# Patient Record
Sex: Female | Born: 1935 | Race: White | Hispanic: No | Smoking: Never smoker
Health system: Southern US, Community
[De-identification: ages and names within clinical notes are randomized; demographics above are authoritative.]

## PROBLEM LIST (undated history)

## (undated) DIAGNOSIS — N3281 Overactive bladder: Secondary | ICD-10-CM

## (undated) DIAGNOSIS — E039 Hypothyroidism, unspecified: Secondary | ICD-10-CM

## (undated) DIAGNOSIS — Z8673 Personal history of transient ischemic attack (TIA), and cerebral infarction without residual deficits: Secondary | ICD-10-CM

## (undated) DIAGNOSIS — R001 Bradycardia, unspecified: Secondary | ICD-10-CM

## (undated) DIAGNOSIS — F32A Depression, unspecified: Secondary | ICD-10-CM

## (undated) DIAGNOSIS — F0390 Unspecified dementia without behavioral disturbance: Secondary | ICD-10-CM

## (undated) DIAGNOSIS — I519 Heart disease, unspecified: Secondary | ICD-10-CM

## (undated) DIAGNOSIS — M1990 Unspecified osteoarthritis, unspecified site: Secondary | ICD-10-CM

## (undated) DIAGNOSIS — F419 Anxiety disorder, unspecified: Secondary | ICD-10-CM

## (undated) DIAGNOSIS — D509 Iron deficiency anemia, unspecified: Secondary | ICD-10-CM

## (undated) DIAGNOSIS — I214 Non-ST elevation (NSTEMI) myocardial infarction: Secondary | ICD-10-CM

## (undated) DIAGNOSIS — I5042 Chronic combined systolic (congestive) and diastolic (congestive) heart failure: Secondary | ICD-10-CM

## (undated) DIAGNOSIS — T4145XA Adverse effect of unspecified anesthetic, initial encounter: Secondary | ICD-10-CM

## (undated) DIAGNOSIS — R410 Disorientation, unspecified: Secondary | ICD-10-CM

## (undated) DIAGNOSIS — F329 Major depressive disorder, single episode, unspecified: Secondary | ICD-10-CM

## (undated) DIAGNOSIS — E7800 Pure hypercholesterolemia, unspecified: Secondary | ICD-10-CM

## (undated) DIAGNOSIS — I10 Essential (primary) hypertension: Secondary | ICD-10-CM

## (undated) DIAGNOSIS — R296 Repeated falls: Secondary | ICD-10-CM

## (undated) DIAGNOSIS — G459 Transient cerebral ischemic attack, unspecified: Secondary | ICD-10-CM

## (undated) DIAGNOSIS — T8859XA Other complications of anesthesia, initial encounter: Secondary | ICD-10-CM

## (undated) HISTORY — DX: Pure hypercholesterolemia, unspecified: E78.00

## (undated) HISTORY — PX: PARTIAL HIP ARTHROPLASTY: SHX733

## (undated) HISTORY — PX: INSERT / REPLACE / REMOVE PACEMAKER: SUR710

## (undated) HISTORY — PX: ABDOMINAL HYSTERECTOMY: SHX81

## (undated) HISTORY — PX: TOTAL KNEE ARTHROPLASTY: SHX125

## (undated) HISTORY — DX: Personal history of transient ischemic attack (TIA), and cerebral infarction without residual deficits: Z86.73

## (undated) HISTORY — DX: Heart disease, unspecified: I51.9

## (undated) HISTORY — DX: Chronic combined systolic (congestive) and diastolic (congestive) heart failure: I50.42

## (undated) HISTORY — DX: Iron deficiency anemia, unspecified: D50.9

---

## 1959-02-16 HISTORY — PX: CATARACT EXTRACTION, BILATERAL: SHX1313

## 1997-09-15 ENCOUNTER — Encounter: Admission: RE | Admit: 1997-09-15 | Discharge: 1997-12-14 | Payer: Self-pay | Admitting: Specialist

## 1998-06-12 ENCOUNTER — Ambulatory Visit (HOSPITAL_COMMUNITY): Admission: RE | Admit: 1998-06-12 | Discharge: 1998-06-12 | Payer: Self-pay

## 1999-06-15 ENCOUNTER — Other Ambulatory Visit: Admission: RE | Admit: 1999-06-15 | Discharge: 1999-06-15 | Payer: Self-pay | Admitting: Internal Medicine

## 1999-08-23 ENCOUNTER — Ambulatory Visit (HOSPITAL_COMMUNITY): Admission: RE | Admit: 1999-08-23 | Discharge: 1999-08-23 | Payer: Self-pay | Admitting: *Deleted

## 2000-02-19 ENCOUNTER — Encounter: Payer: Self-pay | Admitting: Internal Medicine

## 2000-02-19 ENCOUNTER — Encounter: Admission: RE | Admit: 2000-02-19 | Discharge: 2000-02-19 | Payer: Self-pay | Admitting: Internal Medicine

## 2000-10-20 ENCOUNTER — Encounter: Payer: Self-pay | Admitting: Internal Medicine

## 2000-10-20 ENCOUNTER — Encounter: Admission: RE | Admit: 2000-10-20 | Discharge: 2000-10-20 | Payer: Self-pay | Admitting: Internal Medicine

## 2001-03-23 ENCOUNTER — Encounter: Admission: RE | Admit: 2001-03-23 | Discharge: 2001-03-23 | Payer: Self-pay | Admitting: Internal Medicine

## 2001-03-23 ENCOUNTER — Encounter: Payer: Self-pay | Admitting: Internal Medicine

## 2001-12-25 ENCOUNTER — Other Ambulatory Visit: Admission: RE | Admit: 2001-12-25 | Discharge: 2001-12-25 | Payer: Self-pay | Admitting: Family Medicine

## 2002-02-19 ENCOUNTER — Encounter: Payer: Self-pay | Admitting: Specialist

## 2002-02-19 ENCOUNTER — Encounter: Admission: RE | Admit: 2002-02-19 | Discharge: 2002-02-19 | Payer: Self-pay | Admitting: Specialist

## 2002-03-25 ENCOUNTER — Encounter: Payer: Self-pay | Admitting: Family Medicine

## 2002-03-25 ENCOUNTER — Encounter: Admission: RE | Admit: 2002-03-25 | Discharge: 2002-03-25 | Payer: Self-pay | Admitting: Family Medicine

## 2002-06-14 ENCOUNTER — Encounter: Payer: Self-pay | Admitting: Specialist

## 2002-06-18 ENCOUNTER — Inpatient Hospital Stay (HOSPITAL_COMMUNITY): Admission: RE | Admit: 2002-06-18 | Discharge: 2002-06-24 | Payer: Self-pay | Admitting: Specialist

## 2002-06-18 ENCOUNTER — Encounter: Payer: Self-pay | Admitting: Specialist

## 2002-06-22 ENCOUNTER — Encounter: Payer: Self-pay | Admitting: Specialist

## 2003-05-02 ENCOUNTER — Encounter: Admission: RE | Admit: 2003-05-02 | Discharge: 2003-05-02 | Payer: Self-pay | Admitting: Family Medicine

## 2004-05-30 ENCOUNTER — Encounter: Admission: RE | Admit: 2004-05-30 | Discharge: 2004-05-30 | Payer: Self-pay | Admitting: Family Medicine

## 2004-11-19 ENCOUNTER — Encounter: Admission: RE | Admit: 2004-11-19 | Discharge: 2004-11-19 | Payer: Self-pay | Admitting: Family Medicine

## 2005-06-13 ENCOUNTER — Encounter: Admission: RE | Admit: 2005-06-13 | Discharge: 2005-06-13 | Payer: Self-pay | Admitting: Family Medicine

## 2005-06-26 ENCOUNTER — Emergency Department (HOSPITAL_COMMUNITY): Admission: EM | Admit: 2005-06-26 | Discharge: 2005-06-26 | Payer: Self-pay | Admitting: Emergency Medicine

## 2005-06-28 ENCOUNTER — Emergency Department (HOSPITAL_COMMUNITY): Admission: EM | Admit: 2005-06-28 | Discharge: 2005-06-29 | Payer: Self-pay | Admitting: Emergency Medicine

## 2005-07-02 ENCOUNTER — Inpatient Hospital Stay (HOSPITAL_COMMUNITY): Admission: EM | Admit: 2005-07-02 | Discharge: 2005-07-04 | Payer: Self-pay | Admitting: Emergency Medicine

## 2005-07-02 ENCOUNTER — Ambulatory Visit: Payer: Self-pay | Admitting: Cardiovascular Disease

## 2005-07-03 ENCOUNTER — Encounter: Payer: Self-pay | Admitting: Cardiovascular Disease

## 2006-06-25 ENCOUNTER — Encounter: Admission: RE | Admit: 2006-06-25 | Discharge: 2006-06-25 | Payer: Self-pay | Admitting: Family Medicine

## 2006-07-17 ENCOUNTER — Ambulatory Visit: Payer: Self-pay | Admitting: Cardiology

## 2006-07-17 ENCOUNTER — Inpatient Hospital Stay (HOSPITAL_COMMUNITY): Admission: EM | Admit: 2006-07-17 | Discharge: 2006-07-20 | Payer: Self-pay | Admitting: Emergency Medicine

## 2006-07-18 ENCOUNTER — Encounter: Payer: Self-pay | Admitting: Cardiology

## 2006-07-29 ENCOUNTER — Inpatient Hospital Stay (HOSPITAL_COMMUNITY): Admission: AD | Admit: 2006-07-29 | Discharge: 2006-07-31 | Payer: Self-pay | Admitting: Neurology

## 2006-07-29 ENCOUNTER — Encounter: Payer: Self-pay | Admitting: Internal Medicine

## 2006-07-30 ENCOUNTER — Encounter (INDEPENDENT_AMBULATORY_CARE_PROVIDER_SITE_OTHER): Payer: Self-pay | Admitting: Neurology

## 2006-08-15 ENCOUNTER — Ambulatory Visit: Payer: Self-pay | Admitting: Cardiology

## 2007-01-03 ENCOUNTER — Ambulatory Visit: Payer: Self-pay | Admitting: *Deleted

## 2007-01-03 ENCOUNTER — Inpatient Hospital Stay (HOSPITAL_COMMUNITY): Admission: EM | Admit: 2007-01-03 | Discharge: 2007-01-06 | Payer: Self-pay | Admitting: Emergency Medicine

## 2007-01-05 HISTORY — PX: PACEMAKER INSERTION: SHX728

## 2007-01-21 ENCOUNTER — Ambulatory Visit: Payer: Self-pay

## 2007-05-05 ENCOUNTER — Ambulatory Visit: Payer: Self-pay | Admitting: Internal Medicine

## 2007-07-17 ENCOUNTER — Encounter: Admission: RE | Admit: 2007-07-17 | Discharge: 2007-07-17 | Payer: Self-pay | Admitting: Family Medicine

## 2008-01-05 ENCOUNTER — Ambulatory Visit: Payer: Self-pay | Admitting: Internal Medicine

## 2008-05-20 ENCOUNTER — Encounter
Admission: RE | Admit: 2008-05-20 | Discharge: 2008-08-15 | Payer: Self-pay | Admitting: Physical Medicine and Rehabilitation

## 2008-05-23 ENCOUNTER — Ambulatory Visit (HOSPITAL_COMMUNITY)
Admission: RE | Admit: 2008-05-23 | Discharge: 2008-05-23 | Payer: Self-pay | Admitting: Physical Medicine and Rehabilitation

## 2008-05-23 ENCOUNTER — Ambulatory Visit: Payer: Self-pay | Admitting: Physical Medicine and Rehabilitation

## 2008-06-20 ENCOUNTER — Ambulatory Visit: Payer: Self-pay

## 2008-06-24 ENCOUNTER — Ambulatory Visit: Payer: Self-pay | Admitting: Physical Medicine and Rehabilitation

## 2008-06-24 ENCOUNTER — Ambulatory Visit (HOSPITAL_COMMUNITY)
Admission: RE | Admit: 2008-06-24 | Discharge: 2008-06-24 | Payer: Self-pay | Admitting: Physical Medicine and Rehabilitation

## 2008-08-15 ENCOUNTER — Encounter: Admission: RE | Admit: 2008-08-15 | Discharge: 2008-08-15 | Payer: Self-pay | Admitting: Family Medicine

## 2008-08-15 ENCOUNTER — Ambulatory Visit: Payer: Self-pay | Admitting: Physical Medicine and Rehabilitation

## 2008-09-20 ENCOUNTER — Encounter
Admission: RE | Admit: 2008-09-20 | Discharge: 2008-09-20 | Payer: Self-pay | Admitting: Physical Medicine and Rehabilitation

## 2008-09-23 ENCOUNTER — Ambulatory Visit: Payer: Self-pay | Admitting: Physical Medicine and Rehabilitation

## 2008-09-26 ENCOUNTER — Encounter (INDEPENDENT_AMBULATORY_CARE_PROVIDER_SITE_OTHER): Payer: Self-pay

## 2008-11-30 ENCOUNTER — Encounter
Admission: RE | Admit: 2008-11-30 | Discharge: 2008-12-02 | Payer: Self-pay | Admitting: Physical Medicine and Rehabilitation

## 2008-12-02 ENCOUNTER — Ambulatory Visit: Payer: Self-pay | Admitting: Physical Medicine and Rehabilitation

## 2009-02-24 DIAGNOSIS — E7800 Pure hypercholesterolemia, unspecified: Secondary | ICD-10-CM

## 2009-02-24 DIAGNOSIS — R42 Dizziness and giddiness: Secondary | ICD-10-CM

## 2009-02-24 DIAGNOSIS — E039 Hypothyroidism, unspecified: Secondary | ICD-10-CM

## 2009-02-24 DIAGNOSIS — D509 Iron deficiency anemia, unspecified: Secondary | ICD-10-CM | POA: Insufficient documentation

## 2009-02-24 DIAGNOSIS — I498 Other specified cardiac arrhythmias: Secondary | ICD-10-CM

## 2009-02-24 DIAGNOSIS — M1990 Unspecified osteoarthritis, unspecified site: Secondary | ICD-10-CM | POA: Insufficient documentation

## 2009-02-24 DIAGNOSIS — I10 Essential (primary) hypertension: Secondary | ICD-10-CM | POA: Insufficient documentation

## 2009-02-24 DIAGNOSIS — Z950 Presence of cardiac pacemaker: Secondary | ICD-10-CM

## 2009-02-28 ENCOUNTER — Ambulatory Visit: Payer: Self-pay | Admitting: Internal Medicine

## 2009-06-21 ENCOUNTER — Encounter: Payer: Self-pay | Admitting: Internal Medicine

## 2009-06-21 ENCOUNTER — Ambulatory Visit: Payer: Self-pay

## 2009-12-05 ENCOUNTER — Encounter: Payer: Self-pay | Admitting: Internal Medicine

## 2009-12-06 ENCOUNTER — Telehealth: Payer: Self-pay | Admitting: Internal Medicine

## 2009-12-19 ENCOUNTER — Ambulatory Visit: Payer: Self-pay | Admitting: Internal Medicine

## 2010-06-14 ENCOUNTER — Encounter: Payer: Self-pay | Admitting: Internal Medicine

## 2010-06-14 ENCOUNTER — Ambulatory Visit: Payer: Self-pay

## 2010-07-19 NOTE — Procedures (Signed)
Summary: pcp   Current Medications (verified): 1)  Aggrenox 25-200 Mg Xr12h-Cap (Aspirin-Dipyridamole) .... Take One Capsule By Mouth Twice A Day 2)  Synthroid 75 Mcg Tabs (Levothyroxine Sodium) .Marland Kitchen.. 1 By Mouth Once Daily 3)  Diclofenac Sodium 75 Mg Tbec (Diclofenac Sodium) .Marland Kitchen.. 1 By Mouth Two Times A Day 4)  Vitamin B-12 1000 Mcg Tabs (Cyanocobalamin) .Marland Kitchen.. 1 By Mouth Once Daily 5)  Pravastatin Sodium 40 Mg Tabs (Pravastatin Sodium) .... Take One Tablet By Mouth Daily At Bedtime 6)  Oyster Shell Calcium/d 500-125 Mg-Unit Tabs (Calcium-Vitamin D) .Marland Kitchen.. 1 By Mouth Two Times A Day 7)  Fish Oil   Oil (Fish Oil) .Marland Kitchen.. 1 By Mouth Two Times A Day 8)  Multivitamins   Tabs (Multiple Vitamin) .Marland Kitchen.. 1 By Mouth Once Daily 9)  Celexa 20 Mg Tabs (Citalopram Hydrobromide) .Marland Kitchen.. 1 By Mouth Once Daily 10)  Lisinopril 40 Mg Tabs (Lisinopril) .... Take One Tablet By Mouth Daily 11)  Furosemide 20 Mg Tabs (Furosemide) .... Take One Tablet By Mouth Daily. 12)  Potassium Chloride Crys Cr 20 Meq Cr-Tabs (Potassium Chloride Crys Cr) .... Take One Tablet By Mouth Daily 13)  Ropinirole Hcl 1 Mg Tabs (Ropinirole Hcl) .Marland Kitchen.. 1 By Mouth Once Daily 14)  Gabapentin 100 Mg Caps (Gabapentin) .... 2 By Mouth Once Daily 15)  Amlodipine Besylate 10 Mg Tabs (Amlodipine Besylate) .... Take One Tablet By Mouth Daily  Allergies (verified): No Known Drug Allergies  PPM Specifications Following MD:  Lewayne Bunting, MD     PPM Vendor:  Medtronic     PPM Model Number:  ADDR01     PPM Serial Number:  [id number] PPM DOI:  01/05/2007     PPM Implanting MD:  Lewayne Bunting, MD  Lead 1    Location: RA     DOI: 01/05/2007     Model #: [id number]     Serial #: [id number]     Status: active Lead 2    Location: RV     DOI: 01/05/2007     Model #: 6213     Serial #: [id number]     Status: active  Magnet Response Rate:  BOL 85 ERI  65  Indications:  Symptomatic Bradycardia   PPM Follow Up Remote Check?  No Battery Voltage:  2.78 V     Battery Est.  Longevity:  7.5 years     Pacer Dependent:  No       PPM Device Measurements Atrium  Amplitude: 2.0 mV, Impedance: 460 ohms, Threshold: 0.375 V at .04 msec Right Ventricle  Amplitude: 11.20 mV, Impedance: 612 ohms, Threshold: 0.625 V at 0.4 msec  Episodes MS Episodes:  1     Percent Mode Switch:  <0.1%     Coumadin:  No Ventricular High Rate:  0     Atrial Pacing:  67%     Ventricular Pacing:  0.2%  Parameters Mode:  DDDR+     Lower Rate Limit:  60     Upper Rate Limit:  130 Paced AV Delay:  150     Sensed AV Delay:  120 Next Cardiology Appt Due:  12/15/2009 Tech Comments:  No parameter changes.  Device function normal.  ROV 6 months Dr. [name]. Altha Harm, LPN  June 21, 2009 11:21 AM  MD Comments:  Agree with above.

## 2010-07-19 NOTE — Cardiovascular Report (Signed)
Summary: Office Visit   Office Visit   Imported By: Roderic Ovens 07/12/2009 14:08:14  _____________________________________________________________________  External Attachment:    Type:   Image     Comment:   External Document

## 2010-07-19 NOTE — Progress Notes (Signed)
Summary: appt sooner  Phone Note From Other Clinic   Caller: nurse Heather Summary of Call: per [facility] wants to know if pt needs to be seen sooner that 7/5. sending EKG to look at ofc [phone] Initial call taken by: Edman Circle,  December 06, 2009 8:37 AM  Follow-up for Phone Call        no need to see sooner per Dr [name] Dr [facility] aware Dennis Bast, RN, BSN  December 06, 2009 3:15 PM

## 2010-07-19 NOTE — Letter (Signed)
Summary: [facility] Office Visit Note   [facility] Office Visit Note   Imported By: Roderic Ovens 12/27/2009 10:28:47  _____________________________________________________________________  External Attachment:    Type:   Image     Comment:   External Document

## 2010-07-19 NOTE — Assessment & Plan Note (Signed)
Summary: pacer check/medtronic   History of Present Illness: Mr. [name] return today for followup.  She is an 77 yo woman with a h/o symptomatic bradycardia and HTN who is s/p PPM.  She denies c/p or sob.  No syncope.  Her main problem is arthritis.  She has had several joint replacements.  No peripheral edema. The patient c/o ongoing right shoulder pain.    Current Medications (verified): 1)  Aggrenox 25-200 Mg Xr12h-Cap (Aspirin-Dipyridamole) .... Take One Capsule By Mouth Twice A Day 2)  Synthroid 75 Mcg Tabs (Levothyroxine Sodium) .Marland Kitchen.. 1 By Mouth Once Daily 3)  Diclofenac Sodium 75 Mg Tbec (Diclofenac Sodium) .Marland Kitchen.. 1 By Mouth Two Times A Day 4)  Vitamin B-12 1000 Mcg Tabs (Cyanocobalamin) .Marland Kitchen.. 1 By Mouth Once Daily 5)  Pravastatin Sodium 40 Mg Tabs (Pravastatin Sodium) .... Take One Tablet By Mouth Daily At Bedtime 6)  Oyster Shell Calcium/d 500-125 Mg-Unit Tabs (Calcium-Vitamin D) .Marland Kitchen.. 1 By Mouth Two Times A Day 7)  Fish Oil   Oil (Fish Oil) .Marland Kitchen.. 1 By Mouth Two Times A Day 8)  Multivitamins   Tabs (Multiple Vitamin) .Marland Kitchen.. 1 By Mouth Once Daily 9)  Celexa 20 Mg Tabs (Citalopram Hydrobromide) .Marland Kitchen.. 1 By Mouth Once Daily 10)  Lisinopril 40 Mg Tabs (Lisinopril) .... Take One Tablet By Mouth Daily 11)  Furosemide 20 Mg Tabs (Furosemide) .... Take One Tablet By Mouth Daily. 12)  Potassium Chloride Crys Cr 20 Meq Cr-Tabs (Potassium Chloride Crys Cr) .... Take One Tablet By Mouth Daily 13)  Ropinirole Hcl 1 Mg Tabs (Ropinirole Hcl) .Marland Kitchen.. 1 By Mouth Once Daily 14)  Gabapentin 100 Mg Caps (Gabapentin) .... 2 By Mouth Once Daily 15)  Amlodipine Besylate 10 Mg Tabs (Amlodipine Besylate) .... Take One Tablet By Mouth Daily  Allergies (verified): No Known Drug Allergies  Past History:  Past Medical History: Last updated: 02/24/2009 PACEMAKER, PERMANENT (ICD-V45.01) DIZZINESS (ICD-780.4) BRADYCARDIA (ICD-427.89) ANEMIA, IRON DEFICIENCY (ICD-280.9) HYPOTHYROIDISM (ICD-244.9) HYPERTENSION  (ICD-401.9) HYPERCHOLESTEROLEMIA (ICD-272.0) CHF (ICD-428.0) OSTEOARTHRITIS (ICD-715.90)  Past Surgical History: Last updated: 02/24/2009 implantation of a Medtronic dual- chamber pacemaker  01/05/2007  [name]. Dana Ridgel, MD   Bilateral total hip arthroplasties.      Review of Systems  The patient denies chest pain, syncope, dyspnea on exertion, and peripheral edema.    Vital Signs:  Patient profile:   77 year old female Height:      64 inches Weight:      143 pounds BMI:     24.63 Pulse rate:   56 / minute BP sitting:   90 / 46  (left arm)  Vitals Entered By: Laurance Flatten CMA (December 19, 2009 11:36 AM)  Physical Exam  General:  Well developed, well nourished, in no acute distress. Head:  normocephalic and atraumatic Eyes:  PERRLA/EOM intact; conjunctiva and lids normal. Mouth:  Teeth, gums and palate normal. Oral mucosa normal. Neck:  Neck supple, no JVD. No masses, thyromegaly or abnormal cervical nodes. Chest Wall:  well healed PPM incision. Lungs:  Clear bilaterally to auscultation with no wheezes, rales, or rhonchi. Heart:  RRR with normal S1 and S2.  PMI is not enlarged or laterally displaced.  No murmurs. Abdomen:  Bowel sounds positive; abdomen soft and non-tender without masses, organomegaly, or hernias noted. No hepatosplenomegaly. Msk:  Back normal, normal gait. Muscle strength and tone normal. Pulses:  pulses normal in all 4 extremities Extremities:  No clubbing or cyanosis. Neurologic:  Alert and oriented x 3.   PPM Specifications  Following MD:  Lewayne Bunting, MD     PPM Vendor:  Medtronic     PPM Model Number:  ADDR01     PPM Serial Number:  [id number] PPM DOI:  01/05/2007     PPM Implanting MD:  Lewayne Bunting, MD  Lead 1    Location: RA     DOI: 01/05/2007     Model #: 6213     Serial #: [id number]     Status: active Lead 2    Location: RV     DOI: 01/05/2007     Model #: 2952     Serial #: [id number]     Status: active  Magnet Response Rate:  BOL 85 ERI   65  Indications:  Symptomatic Bradycardia   PPM Follow Up Remote Check?  No Battery Voltage:  2.78 V     Battery Est. Longevity:  7.5 years     Pacer Dependent:  No       PPM Device Measurements Atrium  Amplitude: 1.4 mV, Impedance: 473 ohms, Threshold: 0.375 V at 0.4 msec Right Ventricle  Amplitude: 15.68 mV, Impedance: 612 ohms, Threshold: 0.5 V at 0.4 msec  Episodes MS Episodes:  0     Percent Mode Switch:  0     Coumadin:  No Ventricular High Rate:  0     Atrial Pacing:  61.6%     Ventricular Pacing:  3.2%  Parameters Mode:  DDDR+     Lower Rate Limit:  60     Upper Rate Limit:  130 Paced AV Delay:  150     Sensed AV Delay:  120 Next Cardiology Appt Due:  06/17/2010 Tech Comments:  No parameter changes.  Device function normal.  ROV 6 months clinic.  Checked by [employer]. Altha Harm, LPN  December 20, 2723 11:57 AM  MD Comments:  Agree with above.  Impression & Recommendations:  Problem # 1:  PACEMAKER, PERMANENT (ICD-V45.01) Her device is working normally.  Will recheck in several months.  Problem # 2:  HYPERTENSION (ICD-401.9) Her pressures have been fairly well controlled.  Continue meds as below and maintain a low sodium diet. Her updated medication list for this problem includes:    Lisinopril 40 Mg Tabs (Lisinopril) .Marland Kitchen... Take one tablet by mouth daily    Furosemide 20 Mg Tabs (Furosemide) .Marland Kitchen... Take one tablet by mouth daily.    Amlodipine Besylate 10 Mg Tabs (Amlodipine besylate) .Marland Kitchen... Take one tablet by mouth daily  Problem # 3:  CHF (ICD-428.0) Her symptoms are mostly class 1-2.  A low sodium diet and medical therapy as below are recommended. Her updated medication list for this problem includes:    Aggrenox 25-200 Mg Xr12h-cap (Aspirin-dipyridamole) .Marland Kitchen... Take one capsule by mouth twice a day    Lisinopril 40 Mg Tabs (Lisinopril) .Marland Kitchen... Take one tablet by mouth daily    Furosemide 20 Mg Tabs (Furosemide) .Marland Kitchen... Take one tablet by mouth daily.    Amlodipine  Besylate 10 Mg Tabs (Amlodipine besylate) .Marland Kitchen... Take one tablet by mouth daily

## 2010-07-19 NOTE — Procedures (Signed)
Summary: device check medtronic/sl   Current Medications (verified): 1)  Aggrenox 25-200 Mg Xr12h-Cap (Aspirin-Dipyridamole) .... Take One Capsule By Mouth Twice A Day 2)  Synthroid 75 Mcg Tabs (Levothyroxine Sodium) .Marland Kitchen.. 1 By Mouth Once Daily 3)  Diclofenac Sodium 75 Mg Tbec (Diclofenac Sodium) .Marland Kitchen.. 1 By Mouth Two Times A Day 4)  Vitamin B-12 1000 Mcg Tabs (Cyanocobalamin) .Marland Kitchen.. 1 By Mouth Once Daily 5)  Pravastatin Sodium 40 Mg Tabs (Pravastatin Sodium) .... Take One Tablet By Mouth Daily At Bedtime 6)  Oyster Shell Calcium/d 500-125 Mg-Unit Tabs (Calcium-Vitamin D) .Marland Kitchen.. 1 By Mouth Two Times A Day 7)  Fish Oil   Oil (Fish Oil) .Marland Kitchen.. 1 By Mouth Two Times A Day 8)  Multivitamins   Tabs (Multiple Vitamin) .Marland Kitchen.. 1 By Mouth Once Daily 9)  Celexa 20 Mg Tabs (Citalopram Hydrobromide) .Marland Kitchen.. 1 By Mouth Once Daily 10)  Lisinopril 40 Mg Tabs (Lisinopril) .... Take One Tablet By Mouth Daily 11)  Furosemide 20 Mg Tabs (Furosemide) .... Take One Tablet By Mouth Daily. 12)  Potassium Chloride Crys Cr 20 Meq Cr-Tabs (Potassium Chloride Crys Cr) .... Take One Tablet By Mouth Daily 13)  Ropinirole Hcl 1 Mg Tabs (Ropinirole Hcl) .Marland Kitchen.. 1 By Mouth Once Daily 14)  Gabapentin 100 Mg Caps (Gabapentin) .... 2 By Mouth Once Daily 15)  Amlodipine Besylate 10 Mg Tabs (Amlodipine Besylate) .... Take One Tablet By Mouth Daily 16)  Hydrocodone-Acetaminophen 5-500 Mg Tabs (Hydrocodone-Acetaminophen) .... Take 1/2 To 1 Tablet By Mouth Four Times A Day As Needed  Allergies (verified): No Known Drug Allergies  PPM Specifications Following MD:  Lewayne Bunting, MD     PPM Vendor:  Medtronic     PPM Model Number:  ADDR01     PPM Serial Number:  [id number] PPM DOI:  01/05/2007     PPM Implanting MD:  Lewayne Bunting, MD  Lead 1    Location: RA     DOI: 01/05/2007     Model #: 7829     Serial #: [id number]     Status: active Lead 2    Location: RV     DOI: 01/05/2007     Model #: 8469     Serial #: [id number]     Status:  active  Magnet Response Rate:  BOL 85 ERI  65  Indications:  Symptomatic Bradycardia   PPM Follow Up Battery Voltage:  2.78 V     Battery Est. Longevity:  6.5 yrs     Pacer Dependent:  No       PPM Device Measurements Atrium  Amplitude: 2.80 mV, Impedance: 501 ohms, Threshold: 0.50 V at 0.40 msec Right Ventricle  Amplitude: 22.40 mV, Impedance: 539 ohms, Threshold: 0.50 V at 0.40 msec  Episodes MS Episodes:  1     Percent Mode Switch:  <0.1%     Coumadin:  No Ventricular High Rate:  0     Atrial Pacing:  72.2%     Ventricular Pacing:  10.3%  Parameters Mode:  MVP     Lower Rate Limit:  60     Upper Rate Limit:  130 Paced AV Delay:  150     Sensed AV Delay:  120 Next Cardiology Appt Due:  01/12/2011 Tech Comments:  1 MODE SWITCH LASTING LESS THAN 1 MINUTE.  NORMAL DEVICE FUNCTION.  CHANGED RA OUTPUT FROM 1.5 TO 2.0 AND RV OUTPUT FROM 2.0 TO 2.5 V.  ROV IN 6 MTHS W/GT. Vella Kohler  June 14, 2010 1:23  PM

## 2010-07-19 NOTE — Cardiovascular Report (Signed)
Summary: Office Visit   Office Visit   Imported By: Roderic Ovens 06/20/2010 12:22:59  _____________________________________________________________________  External Attachment:    Type:   Image     Comment:   External Document

## 2010-10-30 NOTE — Assessment & Plan Note (Signed)
[facility]                         ELECTROPHYSIOLOGY OFFICE NOTE   [name], [name]                    MRN:          [id number]  DATE:05/05/2007                            DOB:          June 16, 1919    [name] returns today for followup of her pacemaker.  She is a very  pleasant elderly woman with symptomatic bradycardia who underwent  permanent pacemaker insertion back in July.  She returns today for  followup.  She is improved, though she does continue to have some  weakness.  She states that when she gets tired, she has to stop and rest  now.  She denies chest pain. She has dyspnea on exertion.   PHYSICAL EXAMINATION:  GENERAL:  She is a pleasant elderly woman in no  distress.  VITAL SIGNS:  Blood pressure today was 138/76.  The pulse was 88 and  regular.  Respirations were 18.  Weight was 152 pounds.  NECK:  No jugular venous distention.  LUNGS:  Clear bilaterally to auscultation.  No wheezes, rales, or  rhonchi were present.  CARDIOVASCULAR:  Regular rate and rhythm with normal S1 and S2.  EXTREMITIES:  Demonstrated no edema.   Interrogation of her pacemaker demonstrates a Medtronic Adapta with P  waves greater than 2 and R waves of 11, impedance of 533 in the atrium,  623 in the ventricle.  Threshold was 0.5 at 0.4 in both the atrium and  the ventricle.  The battery voltage was 2.78 volts .  She was 68% A  paced.   IMPRESSION:  1. Symptomatic bradycardia.  2. Hypertension.  3. Status post pacemaker insertion.   DISCUSSION:  Overall, [name] is stable, and her pacemaker is working  normally.  We will plan to see her back for followup in July 2009.     [name]. [name], MD  Electronically Signed    GWT/MedQ  DD: 05/05/2007  DT: 05/05/2007  Job #: [id number]   cc:   Burnell Blanks, MD

## 2010-10-30 NOTE — Assessment & Plan Note (Signed)
[name] is an 77 year old married woman who is accompanied by her  granddaughter today.  Her name is [name].  She is the patient of Dr. Phylliss Bob  as well as Dr. Burnell Blanks.   [name] was last seen by me on June 24, 2008.  In the interim, she  has had some physical therapy to work on lower extremity strengthening  and balance.  She reports overall improvement in her strength.  She was  also trialed on gabapentin 100 mg twice a day.  She reports that she  believes it helped somewhat as well with her pain into the shoulders and  neck, especially helpful in the evening when she takes before bed.   Average pain is about a 7 on a scale of 10.  Again, localized mainly to  the shoulders.  Occasional numbness down the right arm.   Sleep is fair.  Pain is worse with activity, improves with medication.   She reports fair relief with current meds.   FUNCTIONAL STATUS:  The patient states in the interim, she has been  doing some baking.  She had been able to make some pies, pudding, as  well as some banana bread.  Her granddaughter indicates that she has not  seemed unstable   with the medications she is currently taking.  Overall, activity is good.  She is independent with self-care.  Denies  depression, anxiety.  Denies harm to self or others.   Reports occasional constipation, which is controlled.   Past medical, social, family history are otherwise unchanged.   MEDICATIONS:  Provided through this clinic include;  1. Gabapentin 100 mg 1 p.o. b.i.d.  2. She also, per Dr. Phylliss Bob, has been giving hydrocodone 10/650, three      to four times a day.   PHYSICAL EXAMINATION:  VITAL SIGNS:  Blood pressure is 122/57, pulse 75,  respirations 18, and 97% saturated on room air.  GENERAL:  She is a well-developed, well-nourished elderly female, who  does not appear in any distress.  She is oriented x3.  Her speech is  clear.  Her affect is bright.  She is alert, cooperative, and pleasant.  She  follows commands without difficulty and answers my questions  appropriately.   Cranial nerves are grossly intact.  Her coordination is intact.  Reflexes are diminished in the lower extremities.  She has good strength  now at the hip flexors, 5/5 hip flexors, 5/5 knee extensors as well as  dorsiflexors.   She is able to stand up from the seated position without pushing off  with her upper extremities today.   Her gait in the room is stable.  Tandem gait and Romberg test are  performed adequately.   She has limitations in the cervical range of motion in all planes.  She  has limitations in shoulder range of motion with abduction, internal as  well as external rotation, left abduction is about 45 degrees, right is  about 50-60 degrees.  Complains of pain with abduction.   Radiographs were reviewed with her.  She has severe degenerative changes  of the glenohumeral joint on the right.  No evidence of fracture is  noted.  Questionable avascular necrosis of the humeral head is not  excluded.   IMPRESSION:  1. Bilateral shoulder osteoarthritis with limitation in range of      motion in all planes consistent with frozen shoulder bilaterally.  2. Cervical spondylosis.  X-rays from May 23, 2008, show  degenerative changes C5-C6, C6-C7, foraminal narrowing at C5-C6.  3. Overall improvement in hip extensor strength as well as hip flexor      strength, now in the 5- to 5/5 range.  4. No falls reported by the patient in the last month.  Overall      activity is increased.  She has been doing some baking.   PLAN:  Encouraged her to continue her home physical therapy program,  which she has now per the therapist.  We will refill her hydrocodone,  decreasing her acetaminophen slightly from 650 to 325, 10/325 one p.o.  up to 4 times per day for shoulder and neck pain, #120 per month.  We  will also refill her gabapentin 100 mg 1 p.o. q.a.m. and p.o. at  bedtime, #60 with 1 refill.    The risks and benefits of each of these medications were reviewed with  [name] as well as her granddaughter, [name], who is here in the room  today.  Overall, they both have reported a good relief with the  medications without any evidence of instability or dizziness.  In fact,  overall physical therapy, she has gained some strength and is more  stable walking.  We will see her back in a month.           ______________________________  Brantley Stage, M.D.     DMK/MedQ  D:  08/15/2008 14:15:32  T:  08/16/2008 06:06:54  Job #:  [id number]   cc:   Areatha Keas, M.D.  Fax: [phone]   Burnell Blanks, MD  Fax: [phone]

## 2010-10-30 NOTE — Assessment & Plan Note (Signed)
[name] is an 77 year old married woman, who is accompanied by her  daughter and granddaughter today.  She was last seen by me August 15, 2008.  In the interim, she has trialed gabapentin and has been taking  hydrocodone not more than 4 times per day typically 2-3 times per day.   Initially, she trialed the gabapentin and then felt it was not really  working and then stopped it, but found that it actually was helping more  than she suspected and she restarted the gabapentin again taking it  twice a day.   She does not feel that she is oversedated or too sleepy or has balance  problems that are new from the medications.  She feels she is getting a  fair relief from the pain medications at this time.  She has severe  arthritis in her right glenohumeral joint.  She states that she has seen  one of Dr. Nilsa Nutting PA's for a further evaluation.  I do not have a note  regarding this visit, however.  They told her to consider an injection  into the shoulder.   Her sleep tends to be poor.  She does nap during the day.   She is able to walk at least 10 minutes at a time.  She is able to climb  stairs.  She does drive.  She is independent with self-care, needs some  assistance with dressing occasionally, and bathing.  She is able to make  meals and toilet herself independently.   Family concerns mainly regarding her not being as active as she used to  be.  Not significant concern regarding oversedation at this point with  current medications.   No new problems with respect to bowel or bladder.  No new numbness,  tingling, weakness.  No history of depression or suicidal ideation.   REVIEW OF SYSTEMS:  Otherwise negative.   Past medical, social, family history unchanged from previous visit.   Exam, blood pressure 122/50, pulse 64, respirations 18, 96% saturated on  room air.  [name] is a well-developed, well-nourished, elderly  female, who appears her stated age.  She is oriented x3.   Speech is  clear.  Affect is bright.  She is alert, cooperative, and pleasant.  Follows my commands without difficulty and answers my questions  appropriately.   Cranial nerves are grossly intact.  Coordination is intact.  Reflexes  are diminished in lower extremities as well as in the upper extremities.  She has good strength at hip flexors, knee extensors, dorsiflexors, and  plantar flexors.  She has some weakness in the hip extensors and she has  some difficulty getting herself out of a chair.  Family members also  note, she has some difficulty getting in and out of the car as well.   Her gait is relatively stable.  She has less flexion at the hip and knee  during swing phase.  However, she does not appear to have any kind of a  foot drop and she is able to perform tandem gait as well as Romberg test  adequately.   She has limitations in cervical range of motion in all planes.  She has  limitations in shoulder range of motion in all planes, right more  limited than the left.  Abduction is not more than 45 degrees on the  right and on the left is about 50-60 degrees.   IMPRESSION:  1. Bilateral shoulder osteoarthritis, right worse than left.  2. Cervical spondylosis,  may be contributing to shoulder pain as well.  3. Overall improvement in hip flexor strength, hip extensors, still      weak functionally.  She has some difficulty getting out of a chair.   PLAN:  We will arrange for home health physical therapy to address her  transfers and as well as balance program.  We will refill the following  medications for her Norco 10/325 one p.o. p.r.n. shoulder pain not more  than 4 times per day 120, gabapentin 100 mg once in the morning and once  at night.   We will see her back in 51-month.           ______________________________  Brantley Stage, M.D.     DMK/MedQ  D:  12/02/2008 13:17:36  T:  12/03/2008 04:45:30  Job #:  [id number]

## 2010-10-30 NOTE — Op Note (Signed)
[name], HOLECEK NO.:  [id number]   MEDICAL RECORD NO.:  [id number]          PATIENT TYPE:  INP   LOCATION:  2034                         FACILITY:  [facility]   PHYSICIAN:  [name]. [name], MD    DATE OF BIRTH:  [birth year]   DATE OF PROCEDURE:  01/05/2007  DATE OF DISCHARGE:                               OPERATIVE REPORT   PROCEDURE PERFORMED:  Implantation of a dual-chamber pacemaker.   INDICATIONS:  Symptomatic bradycardia.   HISTORY OF PRESENT ILLNESS:  The patient is a very pleasant elderly  woman who has a history of diastolic heart failure as well as a history  of symptomatic bradycardia who is admitted to the hospital with near-  syncope and shortness of breath and found to be bradycardic with heart  rates in the 30s and low 40s.  This was associated with hypotension.  She was admitted to the hospital, and her low dose of atenolol was  discontinued, and her cardiovascular medications were held.  Her heart  rate improved somewhat; however, her blood pressure increased, and it  was felt that with her symptomatic bradycardia, advanced age and  diastolic heart failure that permanent pacemaker implantation was most  appropriate.  She is now referred for dual-chamber pacemaker  implantation.   PROCEDURE:  After informed consent was obtained, the patient was taken  to the diagnostic EP lab in the fasting state.  After usual preparation  and draping, intravenous fentanyl and midazolam was given for sedation.  Thirty mL lidocaine was infiltrated in the left infraclavicular region.  A 5-cm incision was carried out over this region, and electrocautery was  utilized to dissect down to the fascial plane.  Ten mL of contrast was  injected into the left upper extremity venous system demonstrating a  patent left subclavian vein.  The vein was subsequently punctured x2  with the Medtronic model 5076 52-cm active fixation pacing lead, serial  number [id number], advanced  to the right ventricle and a Medtronic model  5076 45-cm active fixation pacing lead, serial number [id number],  advanced to the right atrium.  Mapping was sharply carried out in the  right ventricle at the final site.  The R-waves were 13, and the pacing  impedance was 1120 ohms, a threshold 0.7 volts at 0.5 milliseconds.  Ten-  volt did pacing did not stimulate diaphragm.  With these satisfactory  parameters, attention was then turned to placement of the atrial lead  which placed in the anterolateral portion of the right atrial appendage  where P-waves measured 1.72 mV and the pacing impedance was 608 ohms.  The threshold was 0.8 volts at 0.5 milliseconds again.  Ten-volt pacing  did not stimulate diaphragm.  With these satisfactory parameters, the  lead was secured to subpectoralis fascia with a figure-of-eight silk  suture.  Sewing sleeve was also secured with silk suture.  Electrocautery was utilized to make a subcutaneous pocket and kanamycin  irrigation utilized to irrigate the pocket.  The Medtronic adapter dual-  chamber pacemaker, serial number [id number] was connected to the atrial  and ventricular leads and placed back in the  subcutaneous pocket.  Generator secured with silk suture.  At this point, the pocket was again  irrigated with kanamycin, the incision closed with a layer of 2-0 Vicryl  followed by a layer of 3-0 Vicryl, followed by layer of 4-0 Vicryl.  Benzoin was painted on the skin, Steri-Strips were applied, and a  pressure dressing was placed.  The patient was returned to her room in  satisfactory condition.   COMPLICATIONS:  There were no immediate procedure complications.   RESULTS:  This demonstrates successful implantation of a Medtronic dual-  chamber pacemaker in a patient with symptomatic bradycardia and  hypertension.      [name]. [name], MD  Electronically Signed     GWT/MEDQ  D:  01/05/2007  T:  01/05/2007  Job:  [id number]   cc:   Burnell Blanks,  MD  Rollene Rotunda, MD, [facility]

## 2010-10-30 NOTE — Assessment & Plan Note (Signed)
REFERRING PHYSICIAN:  Areatha Keas, MD   [name] is an 77 year old married woman who is accompanied by her  grand daughter whose name is [name].  She has been kindly referred  by Dr. Phylliss Bob and her primary care physician is Dr. Burnell Blanks.   Her chief complaint is bilateral shoulder pain which she has had for  many years now.  She believes that the onset of her shoulder pain was 5  or 6 years ago, possibly longer.  She states that she has had  significant osteoarthritis and has had both hips replaced as well as  knees replaced in the past and her shoulders are her main cause of her  pain currently.  She states that her average pain is about 8 on a scale  of 10, currently in the clinic it is about 4 on the scale of 10 and  localized to bilateral shoulders.  She also states that she occasional  does get some posterior neck pain and she denies any kind of trauma,  however.   She reports overall poor sleep.  Pain is worse with some activities and  pain improves with use of her medications and it returns after the  medications have worn off.  She has been taking her hydrocodone and has  found it to be beneficial initially and she is not sure that it is  giving her as much benefit as it did at one point.  She is currently  taking 10/650 mg Vicodin.   FUNCTIONAL STATUS:  She is able to walk 30 minutes at a time.  She is  able to climb stairs.  She is able to drive.  She requires some  assistance with dressing and bathing, and hospital duties.  She is  independent with feeding and toileting.  She admits occasional bladder  problems and anxiety.  Denies suicidal ideation or depression and  reports with some weakness especially in the upper extremities.   Some complaints of constipation, easy bleeding, and shortness of breath.   PAST MEDICAL HISTORY:  Remarkable for the following:  1. Cardiac pacemaker.  2. Congestive heart failure.  3. Anxiety.  4. Insomnia.  5. Cerebral artery  occlusion.  6. Left parietal infarct.  7. Basilar artery occlusion.  8. TIA.  9. Hypothyroidism.  10.Hypercholesterolemia.  11.Osteoarthritis.  12.Anemia.  13.Diverticulosis.  14.Hemorrhoids.  15.Allergic rhinitis.  16.Urge incontinence.   PAST SURGICAL HISTORY:  1. Positive for pacemaker placement by Dr. Lewayne Bunting in 2008.  2. Bilateral knee joint replacement, Dr. Montez Morita in 1990.  3. Bilateral hip joint replacement, Dr. Montez Morita in 2000 and 2001.   SOCIAL HISTORY:  The patient lives with her husband.  Denies smoking or  alcohol.   FAMILY HISTORY:  Positive for hypertension.   PHYSICAL EXAMINATION:  VITAL SIGNS:  Blood pressure 129/69, pulse 75,  respirations 18, 95% saturation on room air.  GENERAL:  She is a thin elderly woman who does not appear in any  distress.  She is oriented x3.  Speech is clear.  Affect is bright.  She  is alert, cooperative, and pleasant.  She follows commands without any  difficulty.  She answers questions appropriately.  NEUROLOGIC:  Her cranial nerves are grossly intact.  Coordination is  intact.  Reflexes are diminished in upper and lower  extremities.  No abnormal tone is noted.  No clonus is noted.  She has  good strength in the lower extremities without focal deficit.  Upper  extremities are 4/5  at biceps, triceps, brachioradialis, finger flexors,  and intrinsic.  She has difficulty giving me full strength with shoulder  abduction.  She has frozen shoulders bilaterally, very limited  abduction, internal and external rotation in both shoulders.  Abduction  is limited to approximately 45 degrees bilaterally, very little internal  and external rotation in either shoulder is noted.   Cervical range of motion in limited in all planes as well.  She does  have some tenderness to palpation in the upper cervical paraspinal  musculature, and upper trapezius.   Tandem gait and Romberg tests are performed adequately.  She does have  complaints of  some gait instability, however.   IMPRESSION:  1. Bilateral shoulder osteoarthritis with limitation in range of      motion in all planes consistent with frozen shoulder.  2. Limited cervical range of motion and some posterior neck pain      without history of trauma.  3. Decreased strength in hip extensor muscles and as noted, the      patient had difficulty getting out of chair.   PLAN:  1. I will obtain cervical radiographs.  2. Anticipate a trial of physical therapy to improve hip extensors to      decrease stress on shoulders as she gets up and down.  3. Check urine drug screen and anticipate refilling her Vicodin, for      her decreased amount of acetaminophen for her.  May also consider      trailing her on some low dose Neurontin as well.  We will see her      back next month and we will check radiographs, refill meds for her.           ______________________________  Brantley Stage, M.D.     DMK/MedQ  D:  05/23/2008 12:21:44  T:  05/24/2008 07:49:48  Job #:  [id number]   cc:   Areatha Keas, M.D.  Fax: [phone]   Burnell Blanks, MD  Fax: [phone]

## 2010-10-30 NOTE — Assessment & Plan Note (Signed)
[name] is an 77 year old married woman who is accompanied  by her daughter today.  She has been referred by Dr. Phylliss Bob and is also a  patient of Dr. Burnell Blanks.   [name] was seen initially on May 23, 2008.  She is being seen in  our [facility] for bilateral shoulder osteoarthritis  with frozen shoulders bilaterally, also limited cervical range of  motion, cervical spondylosis, and she was noted at the last visit to  have decreased hip extensor strength and difficulty getting out of the  chair.   Her biggest complaint today is bilateral shoulder pain and decreased  function in the shoulders.   Her average pain is about 5 on a scale of 10, currently it is at 8.  Pain is worse with activity, improves with rest and medication.  Pain is  described as fairly constant, sharp in nature.  She gets fair relief  with current meds.   Medications have been provided through primary care.  She is currently  taking Vicodin 10/650 four times a day on the average.  Her urine drug  screen done on May 24, 2008, showed that her opiate levels were in  range for what she was taking, and her report was consistent.   Functional status is as follows.  She has significant limitations in  ability to ambulate any length of time.  She requires assistance with  dressing.  She is independent with toileting.  She is independent with  feeding and requires assistance with meal prep as well.   She denies problems controlling bowel or bladder.  Admits to some  depression, anxiety, denies suicidal ideation.  Admits to some tingling  in the upper extremities intermittently.   Reports occasional constipation.  Otherwise review of systems is  negative.   No other change in past medical, social, or family history since last  visit.   PHYSICAL EXAMINATION:  Blood pressure is 97/50, pulse 85, respirations  18, 95% saturated on room air.  She is an elderly female who appears  her  stated age and does not appear in any distress.  She is oriented x3.  Her speech is clear.  Her affect is bright.  She is alert, cooperative,  and pleasant, and she follows commands without difficulty.  Coordination  is grossly intact.  Reflexes are diminished in upper and lower  extremities.  No abnormal tone is noted.  No clonus is noted.  No  tremors are noted.   Sensation is intact to light touch.   Motor strength is difficult to assess in the shoulder secondary to pain.  Distally, she has 5/5 strength in the upper extremities.  In the lower  extremities, she has 4/5 strength with hip flexors and 4-/5 strength in  hip extensors distally.  Dorsi and plantar flexors are in the 5/5 range.   Her gait is assessed.  She has some difficulty transitioning from  sitting to standing.  She needs to push off with both upper extremities  which increases her shoulder pain and she does so.   Her gait displays a short stride length, slightly wide based.   Tandem gait was not assessed.  Romberg test was performed adequately.   IMPRESSION:  1. Bilateral shoulder osteoarthritis with limitation in range of      motion in all planes consistent with frozen shoulders bilaterally.  2. Cervical spondylosis.  X-rays done on May 23, 2008, showed      degenerative disk disease at  C5-6, C6-7, and foraminal narrowing      particularly at C5-6.  3. Decreased strength in the hip extensors, as well as hip flexors      with difficulty getting out of chair.  4. Status post fall per the patient.  Approximately 3 weeks ago with      increased shoulder pain, decreased range of motion.   PLAN:  1. Radiographs of right shoulder status post fall.  2. Trial Neurontin 100 mg one p.o. nightly x5 days, then b.i.d.  3. She does not need refill on hydrocodone, she has three more refills      per her primary care physician.   We will also have get her setup with Physical Therapy to begin balance  training  program, as well as lower extremity strengthening and  assessment for appropriate assisted device.  We will see her back in a  month.           ______________________________  Brantley Stage, M.D.     DMK/MedQ  D:  06/24/2008 11:05:00  T:  06/24/2008 23:45:45  Job #:  [id number]   cc:   Areatha Keas, M.D.  Fax: [phone]   Burnell Blanks, MD  Fax: [phone]

## 2010-10-30 NOTE — Assessment & Plan Note (Signed)
 HEALTHCARE                         ELECTROPHYSIOLOGY OFFICE NOTE   [name], [name]                    MRN:          [id number]  DATE:01/05/2008                            DOB:          1918/11/19    Ms. Keesey returns today for followup.  She is a very pleasant elderly  woman with a history of symptomatic bradycardia and severe arthritis who  returns today for followup.  She has had no syncope or other complaints  with her arrhythmias.  She does have very severe arthritis and is  bothered by in her shoulders now most severely as well as her hands.   MEDICATIONS:  1. Aggrenox 25/200 two tablets a day.  2. Norvasc 10 a day.  3. Synthroid 75 mcg daily.  4. Diclofenac 75 twice daily.  5. Vitamin B12.  6. Pravachol 40 daily.  7. Lisinopril 40 mg a day.   PHYSICAL EXAMINATION:  GENERAL:  She is a pleasant, elderly-appearing  woman in no acute distress.  VITAL SIGNS:  Blood pressure was 119/72, pulse was 87 and regular,  respirations were 18, and weight was 157 pounds.  NECK:  No jugular venous distention.  LUNGS:  Clear bilaterally to auscultation.  No wheezes, rales, or  rhonchi are present.  CARDIOVASCULAR:  Regular rate and rhythm.  Normal S1 and S2.  EXTREMITIES:  Demonstrated severe arthritis in her hands and her  shoulders.   Interrogation of her pacemaker demonstrates Medtronic Adapta, the P-  waves are greater than 2, the R-waves are 11, the impedance 560 in the A  and 651 at the V, and threshold 0.375 at 0.4 in the A and 0.75 at 0.4 in  the RV.  The battery voltage was 2.78 volts.  There were no intercurrent  atrial arrhythmias noted.  She was 64% A-paced.   IMPRESSION:  1. Symptomatic bradycardia.  2. Status post pacemaker insertion.  3. Hypertension.  4. Severe arthritis.   DISCUSSION:  Overall, Ms. Blanchard is stable.  Her blood pressure is  stable.  Her pacemaker is working normally.  She will follow up for  pacemaker  check in 1 year.    [name]. [name], MD  Electronically Signed   GWT/MedQ  DD: 01/05/2008  DT: 01/06/2008  Job #: [id number]

## 2010-10-30 NOTE — Assessment & Plan Note (Signed)
[facility]                         ELECTROPHYSIOLOGY OFFICE NOTE   [name], [name]                    MRN:          [id number]  DATE:01/21/2007                            DOB:          24-Aug-1918    [name] was seen in the clinic on January 21, 2007, for a wound check  of her newly-implanted Medtronic model number [id number] Adapta.  Date of  implant was January 05, 2007, for bradycardia.   On interrogation of her device today, her battery voltage is 2.78, P-  waves measured 2.8 at 4.0 millivolts with an atrial capture threshold of  0.5 volts at 0.4 milliseconds and an atrial lead impedance of 502 ohms.  R-waves measured 11.2 to 15.68 millivolts with a ventricular capture  threshold of 0.5 volts at 0.4 milliseconds and a ventricular lead  impedance of 619 ohms.  There were no episodes noted.  Underlying rhythm  today was sinus rhythm at 60.  She is ventricularly pacing about 0.9% of  the time, capture that.  This is programmed on in both A and V.   Her Steri-Strips were removed.  Patient does have a tape allergy and is  red around the area.  Otherwise, no edema and healing well.  She will be  seen again in three months' time.      Altha Harm, LPN  Electronically Signed      [name]. [name], MD  Electronically Signed   PO/MedQ  DD: 01/21/2007  DT: 01/21/2007  Job #: [id number]

## 2010-11-02 NOTE — Discharge Summary (Signed)
NAMEMARVELLA, [name] NO.:  [id number]   MEDICAL RECORD NO.:  [id number]          PATIENT TYPE:  INP   LOCATION:  2034                         FACILITY:  [facility]   PHYSICIAN:  [name]. [name], MD    DATE OF BIRTH:  05-04-1919   DATE OF ADMISSION:  01/03/2007  DATE OF DISCHARGE:  01/06/2007                               DISCHARGE SUMMARY   ALLERGIES:  This patient has an allergy to MORPHINE.   DISCHARGE PROCESS:  Greater than 35 minutes.   FINAL DIAGNOSES:  1. Symptomatic bradycardia/presyncope.  2. Discharging day #1, status post implant of Medtronic ADAPTA dual-      chamber pacemaker.   SECONDARY DIAGNOSES:  1. History of acute on chronic diastolic congestive heart failure,      January 2008.  2. Ejection fraction is preserved.  3. Hypertension.  4. Dyslipidemia.  5. Treated hypothyroidism.  6. Bilateral total hip arthroplasties.   PROCEDURE:  January 05, 2007, implant of Medtronic ADAPTA [id number] dual-  chamber pacemaker set to DDD mode for symptomatic bradycardia, Dr. Lewayne Bunting.  The patient has had no postprocedural complications,  discharging day #1 after the procedure.  Chest x-ray has been examined  and shows no pneumothorax.  The device has been interrogated and all  parameters check out as normal.  Mobility and incision care have been  discussed with the patient.  Followup appointment is on the pink sheet.  The patient is to discharge on January 06, 2007.   DISCHARGE MEDICATIONS:  1. Aggrenox 25/20 two times daily.  2. Atenolol as needed.  3. Darvocet-N 100 as needed.  4. Lasix as needed; her Lasix is a 20-mg tablet.  5. Lescol 80 mg daily at bedtime.  6. Lexapro 20 mg daily.  7. Lisinopril 40 mg daily.  8. Norvasc 10 mg daily.  9. Potassium chloride as needed when she is taking Lasix.  10.Rozerem 8 mg daily at bedtime.  11.Synthroid 75 mcg daily.  12.Voltaren 75 mg every 6 hours as needed.   WOUND CARE:  The patient is asked to keep her  incision dry for the next  7 days, to sponge bathe until Monday, July 28.   FOLLOWUP:  She will present to the Pacer Clinic at [facility],  [address], Wednesday, August 6, at 9:00.  She will see  Dr. [name] in November; his office will call with that appointment.  The  patient has been given a prescription for Darvocet-N 100 to take for  discomfort at the pacemaker incision site.      Maple Mirza, PA      [name]. [name], MD  Electronically Signed    GM/MEDQ  D:  02/11/2007  T:  02/12/2007  Job:  [id number]   cc:   Burnell Blanks, MD

## 2010-11-02 NOTE — Consult Note (Signed)
NAME:  DALANEY, NEEDLE NO.:  [id number]   MEDICAL RECORD NO.:  [id number]          PATIENT TYPE:  INP   LOCATION:  3016                         FACILITY:  [facility]   PHYSICIAN:  Melvyn Novas, M.D.  DATE OF BIRTH:  [birth year]   DATE OF CONSULTATION:  07/03/2005  DATE OF DISCHARGE:                                   CONSULTATION   The patient is admitted to the [facility] hospitalist team B after suffering  a 15-minute spell last night where she was unable to walk, felt generalized  weak, and was not responding to external stimuli.  The patient's chief  complaint was fainting.  Her family feels that this might be a transient  stroke.  The patient's history of present illness begins over 10 days ago  when the patient went twice to the [facility] Emergency Room with spells  like that.  During the spells she loses consciousness or seems to be  unresponsive.  The patient herself is not quite sure that she is truly  unaware of her surroundings.  She just feels that she cannot response, she  states.  She was found drooling sitting in her armchair while by T.V. one  day.  Another day the spell occurred right after she had a meal.  The left  side seems to be weaker than the right but all four extremities appear limp  to the family.  The family tried to obtain her blood pressures at home and  states that she had systolic blood pressure spikes around 200 each time  after she had such a spell.  They called last night finally the EMS which  brought the patient this time to [facility] and there she was  evaluated feeling dizzy and lightheaded at the time of arrival, but coming  slowly back to her baseline.  She again stated she was generalized weak.  She was at the funeral of a neighbor she states.  There was no major  emotional upheaval when she started  to suddenly get lightheaded, needed to  sit down, and passed out.  In the ER her blood pressure was 140/80, heart  rate was regular and her family again believes that EMS had measured a blood  pressure spike on the way down here.   REVIEW OF SYSTEMS:  The patient says she is lightheaded, has neck pain, but  no specific head pain.  She has not identified any triggers for her spells,  neither in relation to meals, time of day, or exertion.  She has never lost  bladder or bowel control.  She has never had a tongue bite visit.  There was  no convulsion episodes witnessed ever.  She had diarrhea yesterday because  she was diagnosed with sinusitis on Friday by CT scan at [facility] and started on antibiotic.  All other medications she has not  memorized, and I read them from her admission sheet.  She is taking atenolol  25 mg b.i.d., lisinopril unknown dose, Lescol unknown dose, Synthroid  unknown dose, Celebrex unknown dose, amitriptyline unknown dose, B12 unknown  dose, fish  oil, calcium, and multivitamin.   Laboratory results show a sodium level of 142, potassium of 4.3, BUN 16,  creatinine 0.8, sugar was 97 fasting in the morning.  Vital signs were  stable.  Temperature is 98, blood pressure 140/70, heart rate is in the 50s,  respiratory rate is 18.  O2 saturation on room air is 93%.  The patient's  examination shows no clubbing, cyanosis, or edema. She has significant  arthritic changes in her finger joints and has undergone a bilateral hip and  knee replacement surgery.   EXAMINATION:  Mental status -  alert and oriented x3.  Clear speech.  Memory  is intact.  Cranial nerve examination shows pupils were  reactive and equal  to light and accommodation.  Extraocular movements are intact.  The patient  denies any history of glaucoma or cataract.  Facial symmetry is preserved.  Tongue and uvula move midline.  She can raise both eyebrows to command.  Her  neck is not supple, as she states she has arthritis of the spine and has not  been able to flex and extend her neck.  Motor examination is  5/5.  Antigravity movement is preserved.  Deep tendon reflexes are 1+.  The  patient could initiate a finger-nose test, but seems to have mild dysmetria.  No tremor, no ataxia.  Heel to shin was deferred.  Sensory is intact to  touch, pinprick, and vibration.  Gait examination was deferred.   ASSESSMENT:  Possible syncope with labile hypertension versus transient  ischemic attack.  We will obtain an MRI.  Transcranial Doppler ordered.  Telemetry is already arranged and the patient will have also PT and  rehabilitation evaluation.  I doubt that her bradycardia and hypertension  are easily treatable.  We might be able to arrange for some medication to  find a better way of controlling her labile hypertension.  The patient is  followed by Dr. Darnelle Catalan, internal medicine service [facility] hospitalist team.      Melvyn Novas, M.D.  Electronically Signed     CD/MEDQ  D:  07/03/2005  T:  07/03/2005  Job:  [id number]   cc:   Pramod P. Pearlean Brownie, MD  Fax: [phone]

## 2010-11-02 NOTE — Assessment & Plan Note (Signed)
[facility]                            CARDIOLOGY OFFICE NOTE   [name], [name]                    MRN:          [id number]  DATE:08/15/2006                            DOB:          07-12-1918    REFERRING PHYSICIAN:  Burnell Blanks, MD   REASON FOR VISIT:  Evaluate patient with heart failure.   HISTORY OF PRESENT ILLNESS:  The patient is a lovely 77 year old who was  hospitalized twice in the past year. In late January she was admitted  with hypertensive urgency. She was found to have congestive heart  failure with a well-preserved ejection fraction. Her EF was 60%. There  was evidence of left ventricular hypertrophy that was mild. There was  some mild to moderate mitral regurgitation. She was managed with blood  pressure control and diuresis. Of note, she did have a BNP level of 736  during that admission. She had sinus bradycardic first degree AV block  secondary to beta blockers noted as well.   The patient was hospitalized most recently on February 12 to February  14. She had some right leg numbness. These were transient. She was felt  to have some vertebrobasilar insufficiency.   The patient did want to present to her cardiologist to discuss this  question of congestive heart failure.   Since going home, the patient has kept a good blood pressure diary and  presents with her granddaughter today. She has blood pressures in the  systolic 140-150 range for the most part and diastolic is well  controlled in the 50-70s. She has had occasional systolic blood  pressures in the one teens.   She has not had any symptoms. In particular, she is not describing any  shortness of breath. She has not had any PND or orthopnea. She has had  no palpitations, presyncope or syncope. She denies any chest discomfort.   PAST MEDICAL HISTORY:  Vertebrobasilar insufficiency, hypertension,  degenerative joint disease, diastolic heart failure,  dyslipidemia,  bradycardia secondary to beta blockers, anemia, hypothyroidism secondary  to thyroid surgery.   PAST SURGICAL HISTORY:  Thyroidectomy, bilateral total knee  replacements, bilateral total hip replacements, hysterectomy, cataract  surgery.   ALLERGIES:  MORPHINE.   MEDICATIONS:  1. B12.  2. Voltaren 75 mg b.i.d.  3. Norvasc 10 mg daily.  4. K-Dur 10 mEq p.r.n.  5. Lasix 20 mg p.r.n.  6. Lexapro 10 mg daily.  7. Lisinopril 40 mg daily.  8. Trazodone 100 mg daily.  9. Lescol 80 mg daily.  10.Fish oil.  11.Synthroid 75 mcg daily.  12.Calcium.  13.Aggrenox 25/200 b.i.d.  14.Multivitamin.   SOCIAL HISTORY:  The patient is retired, she is married, she has 3  children. She does not smoke cigarettes. She does not drink alcohol.   FAMILY HISTORY:  Noncontributory for early coronary artery disease in  first degree relatives.   REVIEW OF SYSTEMS:  Positive as stated in the HPI and otherwise negative  for other systems.   PHYSICAL EXAMINATION:  GENERAL:  The patient is in no distress.  VITAL SIGNS:  Blood pressure 128/70, heart rate 69 and regular,  weight  158 pounds.  HEENT:  Eyelids unremarkable. Pupils equal round and reactive to light.  Fundi not visualized. Oral mucosa unremarkable.  NECK:  No jugular venous distention, wave form within normal limits,  carotid upstroke brisk and symmetric, no bruits, no thyromegaly.  LYMPHATICS:  No cervical, axillary or inguinal adenopathy.  LUNGS:  Clear to auscultation bilaterally.  BACK:  No costovertebral angle tenderness.  CHEST:  Unremarkable.  HEART:  PMI not displaced or sustained, S1 and S2 within normal limits,  no S3, no S4, no clicks, no rubs, no murmurs.  ABDOMEN:  Flat, positive bowel sounds, normal to frequency and pitch, no  bruits, no rebound, no guarding, no midline pulsatile mass, no  hepatomegaly, no splenomegaly.  SKIN:  No rashes, no nodules.  EXTREMITIES:  2+ pulses throughout, no edema, no cyanosis,  no clubbing.  NEUROLOGIC:  Oriented to person, placed and time. Cranial nerves II-XII  grossly intact. Motor grossly intact.   ASSESSMENT/PLAN:  1. Heart failure. The patient has diastolic heart failure. I went over      this diagnosis in quite a bit of detail with the patient and her      granddaughter. They understand now that she has heart failure with      a well-preserved ejection fraction. They understand the goals of      blood pressure control, salt restriction and fluid restriction.      They understand the importance of daily weights and diuretics as      needed.  2. Hypertension. Her blood pressure is currently well controlled. I      actually would strive not to make it much lower than it is. There      is this issue of vertebrobasilar insufficiency. Apparently the      neurologist wanted to keep her blood pressure on the higher side. I      think we ought to      compromise at the 140s to 150s to avoid further cerebrovascular      accidents.  3. Followup. The patient can come back to this clinic as needed.     Rollene Rotunda, MD, [facility]  Electronically Signed    JH/MedQ  DD: 08/18/2006  DT: 08/19/2006  Job #: [id number]   cc:   Burnell Blanks, MD

## 2010-11-02 NOTE — Procedures (Signed)
EEG NUMBER:  [id number].   CLINICAL HISTORY:  An 77 year old woman with right-sided numbness,  weakness and tingling.  EEG is performed for evaluation.  This is a  routine EEG done with photic stimulation and hyperventilation with the  patient described as awake.   DESCRIPTION:  The dominant rhythm of this tracing is a moderate  amplitude alpha rhythm of 9-10 Hz which predominates posteriorly,  appears without abnormal asymmetry and attenuates with eye opening and  closing.  Low amplitude fast activity is seen frontally and centrally  and appears without abnormal asymmetry.  No focal slowing is noted.  No  epileptiform discharges are seen.  The patient remained in the awake  state throughout the recording.  Photic stimulation produced symmetric  driving responses.  Hyperventilation produced no significant change in  the background rhythms.  Single channel devoted to EKG revealed sinus  rhythm throughout at the rate of approximately 60 beats per minute.   CONCLUSION:  Normal study in the awake state.      Michael L. Thad Ranger, M.D.  Electronically Signed     [id number]  D:  07/30/2006 17:28:57  T:  07/30/2006 22:51:32  Job #:  [id number]

## 2010-11-02 NOTE — Procedures (Signed)
[place]  Patient:    [name], [name]                    MRN: [id number] Proc. Date: 08/23/99 Adm. Date:  [id number] Attending:  Sabino Gasser                           Procedure Report  PROCEDURE PERFORMED:  Colonoscopy.  ENDOSCOPIST:  Sabino Gasser, M.D.  INDICATIONS FOR PROCEDURE:  Hemoccult positivity with iron deficiency anemia.  ANESTHESIA:  Demerol 25 mg, Versed 1.5 mg was given additionally.  DESCRIPTION OF PROCEDURE:  With the patient mildly sedated in the left lateral decubitus position, the Olympus videoscopic pediatric variable flexed colonoscope was inserted in the rectum and passed under direct vision into the cecum.  The cecum was identified by the ileocecal valve and appendiceal orifice, both of which were photographed.  From this point, the colonoscope was slowly withdrawn, taking circumferential views of the entire colonic mucosa, stopping to photograph diverticula seen in the sigmoid colon and then only stopping in the rectum, which appeared normal on direct and showed large internal hemorrhoids on retroflex view. The endoscope was straightened and withdrawn.  Patients vital signs and pulse oximeter remained stable.  The patient tolerated the procedure well and without  apparent complications.  FINDINGS:  Internal hemorrhoids, fairly large and diverticulosis, mild to moderate of sigmoid colon.  Otherwise unremarkable colonoscopic examination to the cecum.  PLAN:  Will have patient follow up with me as an outpatient. DD:  08/23/99 TD:  08/24/99 Job: [id number]

## 2010-11-02 NOTE — H&P (Signed)
[name], [name] NO.:  [id number]   MEDICAL RECORD NO.:  [id number]          PATIENT TYPE:  INP   LOCATION:  2034                         FACILITY:  [facility]   PHYSICIAN:  Satira Anis, MDDATE OF BIRTH:  March 28, 1919   DATE OF ADMISSION:  01/03/2007  DATE OF DISCHARGE:  01/06/2007                              HISTORY & PHYSICAL   REASON FOR ADMISSION:  Symptomatic bradycardia.   HISTORY OF PRESENT ILLNESS:  The patient is a very pleasant 77 year old  female with a history of chronic medical problems including  hypertension, chronic congestive heart failure who has been in her usual  state of health until the last 1 week when she developed episodic  dizziness and lightheadedness.  In the emergency room she had a heart  rate of 30 beats per minute, blood pressure of 132/46.  She is not on  any known chronotropic agent.  On telemetry she had a range of  bradycardic rhythm abnormalities from sinus bradycardia to ectopic  atrial impulses with junctional escape rhythm all comprising sick sinus  syndrome.  It was felt that she needed admission for permanent pacemaker  implantation.  Her daughter was at the bedside, and she could provide  more historical details.  The patient did not have any actual chest  pain.  She was biomarker negative for myocardial injury.  Electrocardiogram did not show any changes consistent with ischemia or  injury pattern.  She is otherwise doing well.  She does not offer any  other complaints.   PAST MEDICAL HISTORY:  1. Osteoarthritis.  2. Chronic heart failure.  3. Hypercholesterolemia.  4. Hypertension.  5. Hypothyroidism.  6. Iron deficiency anemia.   SOCIAL HISTORY:  Nonsmoker and nondrinker, no alcohol abuse.  Lives with  her husband.   FAMILY HISTORY:  Significant for CAD, CVA and COPD.   REVIEW OF SYSTEMS:  Pertinent positives are noted in the History of  Present Illness. The remainder are negative.   MEDICATIONS:  1. Lescol 80 mg nightly at bedtime.  2. Lexapro 20 mg nightly at bedtime.  3. Synthroid 75 mcg daily.  4. Norvasc 10 mg daily.  5. Lisinopril 40 mg daily.  6. Warfarin 10 mg daily.  7. Lasix 20 mg as needed.  8. Darvocet-N 100 as needed.   ALLERGIES:  MORPHINE.   PHYSICAL EXAMINATION:  VITAL SIGNS:  Blood pressure 132/46, pulse rate  44, respiratory rate 15, saturation 96%, temperature 98.1.  HEENT:  Atraumatic and normocephalic.  Anicteric sclerae.  No pallor of  the conjunctivae.  Mucous membranes are moist.  NECK: JVP normal.  Carotid upstroke is brisk with normal contour.  Thyroid is normal in size.  LUNGS:  Clear to auscultation.  HEART: S1 and S2 regular.  No murmurs, no gallop.  ABDOMEN:  Soft.  EXTREMITIES: No edema.  Normal pulses.  NEURO:  Examination nonfocal with intact cognition.   EKG:  Shows severe sinus bradycardia with normal ST-T segments.   ASSESSMENT/PLAN:  An elderly lady with sick sinus syndrome who presented  with symptomatic bradycardia.  She will need chronic medical therapy  probably with beta blockers  to manage her hypertension.  Permanent  pacemaker implantation was discussed with her and her daughter at  bedside.  At present she does not need temporary pacing support as she  is hemodynamically stable.  The EP group will revisit the issue in the morning with her.      Satira Anis, MD  Electronically Signed     RN/MEDQ  D:  04/11/2007  T:  04/12/2007  Job:  [id number]

## 2010-11-22 ENCOUNTER — Encounter: Payer: Self-pay | Admitting: Internal Medicine

## 2010-12-27 ENCOUNTER — Encounter: Payer: Self-pay | Admitting: Internal Medicine

## 2011-03-06 ENCOUNTER — Encounter: Payer: Self-pay | Admitting: Internal Medicine

## 2011-03-06 ENCOUNTER — Ambulatory Visit (INDEPENDENT_AMBULATORY_CARE_PROVIDER_SITE_OTHER): Payer: Medicare Other | Admitting: Internal Medicine

## 2011-03-06 DIAGNOSIS — I10 Essential (primary) hypertension: Secondary | ICD-10-CM

## 2011-03-06 DIAGNOSIS — R001 Bradycardia, unspecified: Secondary | ICD-10-CM

## 2011-03-06 DIAGNOSIS — Z950 Presence of cardiac pacemaker: Secondary | ICD-10-CM

## 2011-03-06 DIAGNOSIS — I498 Other specified cardiac arrhythmias: Secondary | ICD-10-CM

## 2011-03-06 LAB — PACEMAKER DEVICE OBSERVATION
AL AMPLITUDE: 2.8 mv
AL THRESHOLD: 0.25 V
ATRIAL PACING PM: 75
BAMS-0001: 175 {beats}/min
RV LEAD IMPEDENCE PM: 608 Ohm
RV LEAD THRESHOLD: 0.5 V
VENTRICULAR PACING PM: 7

## 2011-03-06 NOTE — Assessment & Plan Note (Signed)
Her blood pressure is well controlled. I discussed importance of low sodium diet.

## 2011-03-06 NOTE — Patient Instructions (Signed)
Your physician wants you to follow-up in: 6 months in the device clinic and 12 months with Dr Taylor You will receive a reminder letter in the mail two months in advance. If you don't receive a letter, please call our office to schedule the follow-up appointment.  

## 2011-03-06 NOTE — Progress Notes (Signed)
HPI [name] turns today for followup. She is a 77 year old woman with a history of hypertension, symptomatic bradycardia, status post permanent pacemaker insertion. The patient's main complaint is arthritis. She is status post multiple joint replacements and has recurrent pain particularly in her left knee. This makes it hard for her to get around. She has had no syncope and denies any falls. No chest pain, shortness of breath, or palpitations. No Known Allergies   Current Outpatient Prescriptions  Medication Sig Dispense Refill  . citalopram (CELEXA) 20 MG tablet Take 20 mg by mouth daily.        Marland Kitchen darifenacin (ENABLEX) 7.5 MG 24 hr tablet Take 7.5 mg by mouth daily.        Marland Kitchen dipyridamole-aspirin (AGGRENOX) 25-200 MG per 12 hr capsule Take 1 capsule by mouth 2 (two) times daily.        . fish oil-omega-3 fatty acids 1000 MG capsule Take 2 g by mouth daily.        . furosemide (LASIX) 20 MG tablet Take 20 mg by mouth daily.        Marland Kitchen gabapentin (NEURONTIN) 100 MG capsule Take 200 mg by mouth daily.        Marland Kitchen HYDROcodone-acetaminophen (VICODIN) 5-500 MG per tablet Take 1 tablet by mouth every 6 (six) hours as needed. Take 1/2 to 1 tablet QID prn       . levothyroxine (SYNTHROID, LEVOTHROID) 75 MCG tablet Take 75 mcg by mouth daily.        . Multiple Vitamin (MULTIVITAMIN) tablet Take 1 tablet by mouth daily.        . pravastatin (PRAVACHOL) 40 MG tablet Take 40 mg by mouth at bedtime.        . vitamin B-12 (CYANOCOBALAMIN) 100 MCG tablet Take 50 mcg by mouth daily.           Past Medical History  Diagnosis Date  . Cardiac pacemaker in situ   . Dizziness and giddiness   . Other specified cardiac dysrhythmias     bradycardia  . Iron deficiency anemia, unspecified   . Unspecified hypothyroidism   . Unspecified essential hypertension   . Pure hypercholesterolemia   . Congestive heart failure, unspecified   . Osteoarthrosis, unspecified whether generalized or localized, unspecified site       ROS:   All systems reviewed and negative except as noted in the HPI.   Past Surgical History  Procedure Date  . Pacemaker insertion 01/05/07    [employer]onic, dual chamber. Dr. Lewayne Bunting MD.  . Bilateral total hip arthoplasties      Family History  Problem Relation Age of Onset  . Coronary artery disease Other   . Stroke Other   . COPD Other      History   Social History  . Marital Status: Married    Spouse Name: N/A    Number of Children: N/A  . Years of Education: N/A   Occupational History  . Not on file.   Social History Main Topics  . Smoking status: Never Smoker   . Smokeless tobacco: Not on file  . Alcohol Use: No  . Drug Use: No  . Sexually Active: Not on file   Other Topics Concern  . Not on file   Social History Narrative   Lives with her husband.      BP 128/62  Pulse 79  Ht 5\' 6"  (1.676 m)  Wt 147 lb 12.8 oz (67.042 kg)  BMI 23.86 kg/m2  Physical Exam:  Well appearing elderly woman, NAD HEENT: Unremarkable Neck:  No JVD, no thyromegally Lymphatics:  No adenopathy Back:  No CVA tenderness Lungs:  Clear with no wheezes, rales, or rhonchi. HEART:  Regular rate rhythm, no murmurs, no rubs, no clicks Abd:  soft, positive bowel sounds, no organomegally, no rebound, no guarding Ext:  2 plus pulses, no edema, no cyanosis, no clubbing Skin:  No rashes no nodules Neuro:  CN II through XII intact, motor grossly intact  DEVICE  Normal device function.  See PaceArt for details.   Assess/Plan:

## 2011-03-06 NOTE — Assessment & Plan Note (Signed)
Her device is working normally. We'll plan to recheck in several months. 

## 2011-04-01 LAB — CBC
HCT: 29.9 — ABNORMAL LOW
Platelets: 287
RBC: 3.79 — ABNORMAL LOW
RDW: 12.8
WBC: 6.9

## 2011-04-01 LAB — I-STAT 8, (EC8 V) (CONVERTED LAB)
Bicarbonate: 24
Glucose, Bld: 109 — ABNORMAL HIGH
Hemoglobin: 10.2 — ABNORMAL LOW
Sodium: 125 — ABNORMAL LOW
TCO2: 25
pH, Ven: 7.401 — ABNORMAL HIGH

## 2011-04-01 LAB — DIFFERENTIAL
Basophils Absolute: 0
Eosinophils Relative: 3
Lymphocytes Relative: 28
Neutrophils Relative %: 57

## 2011-04-01 LAB — POCT I-STAT CREATININE
Creatinine, Ser: 1.1
Operator id: 161631

## 2011-04-01 LAB — BASIC METABOLIC PANEL
Calcium: 8.8
Creatinine, Ser: 0.78
GFR calc Af Amer: >60
GFR calc non Af Amer: >60

## 2011-04-01 LAB — POCT CARDIAC MARKERS
CKMB, poc: <1 — ABNORMAL LOW
Troponin i, poc: <0.05

## 2011-04-01 LAB — TSH: TSH: 2.092

## 2011-04-01 LAB — PROTIME-INR
INR: 1
Prothrombin Time: 12.8

## 2011-04-01 LAB — B-NATRIURETIC PEPTIDE (CONVERTED LAB): Pro B Natriuretic peptide (BNP): 1265 — ABNORMAL HIGH

## 2011-10-09 ENCOUNTER — Emergency Department (HOSPITAL_COMMUNITY)
Admission: EM | Admit: 2011-10-09 | Discharge: 2011-10-09 | Disposition: A | Discharge status: Home / Self Care | Payer: Medicare Other | Attending: Emergency Medicine | Admitting: Emergency Medicine

## 2011-10-09 ENCOUNTER — Other Ambulatory Visit: Payer: Self-pay

## 2011-10-09 ENCOUNTER — Emergency Department (HOSPITAL_COMMUNITY): Payer: Medicare Other

## 2011-10-09 ENCOUNTER — Encounter (HOSPITAL_COMMUNITY): Payer: Self-pay | Admitting: Emergency Medicine

## 2011-10-09 ENCOUNTER — Telehealth: Payer: Self-pay | Admitting: Physician Assistant

## 2011-10-09 DIAGNOSIS — Z950 Presence of cardiac pacemaker: Secondary | ICD-10-CM | POA: Insufficient documentation

## 2011-10-09 DIAGNOSIS — M1990 Unspecified osteoarthritis, unspecified site: Secondary | ICD-10-CM | POA: Insufficient documentation

## 2011-10-09 DIAGNOSIS — R51 Headache: Secondary | ICD-10-CM | POA: Insufficient documentation

## 2011-10-09 DIAGNOSIS — R42 Dizziness and giddiness: Secondary | ICD-10-CM | POA: Insufficient documentation

## 2011-10-09 DIAGNOSIS — I509 Heart failure, unspecified: Secondary | ICD-10-CM | POA: Insufficient documentation

## 2011-10-09 DIAGNOSIS — Z79899 Other long term (current) drug therapy: Secondary | ICD-10-CM | POA: Insufficient documentation

## 2011-10-09 DIAGNOSIS — E039 Hypothyroidism, unspecified: Secondary | ICD-10-CM | POA: Insufficient documentation

## 2011-10-09 DIAGNOSIS — S0990XA Unspecified injury of head, initial encounter: Secondary | ICD-10-CM | POA: Insufficient documentation

## 2011-10-09 LAB — CBC
MCH: 34.4 pg — ABNORMAL HIGH (ref 26.0–34.0)
Platelets: 213 10*3/uL (ref 150–400)
RBC: 3.58 MIL/uL — ABNORMAL LOW (ref 3.87–5.11)
WBC: 4.9 10*3/uL (ref 4.0–10.5)

## 2011-10-09 LAB — BASIC METABOLIC PANEL
CO2: 27 mEq/L (ref 19–32)
Calcium: 8.8 mg/dL (ref 8.4–10.5)
Chloride: 103 mEq/L (ref 96–112)
Sodium: 139 mEq/L (ref 135–145)

## 2011-10-09 LAB — PROTIME-INR
INR: 0.91 (ref 0.00–1.49)
Prothrombin Time: 12.4 seconds (ref 11.6–15.2)

## 2011-10-09 MED ORDER — HYDROCODONE-ACETAMINOPHEN 5-325 MG PO TABS
1.0000 | ORAL_TABLET | Freq: Once | ORAL | Status: AC
  Administered 2011-10-09: 1 via ORAL

## 2011-10-09 MED ORDER — HYDROCODONE-ACETAMINOPHEN 5-325 MG PO TABS
ORAL_TABLET | ORAL | Status: AC
  Administered 2011-10-09: 1 via ORAL
  Filled 2011-10-09: qty 1
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
## 2011-10-10 ENCOUNTER — Telehealth: Payer: Self-pay | Admitting: Internal Medicine

## 2011-10-10 ENCOUNTER — Encounter: Payer: Self-pay | Admitting: Internal Medicine

## 2011-10-10 ENCOUNTER — Ambulatory Visit (INDEPENDENT_AMBULATORY_CARE_PROVIDER_SITE_OTHER): Payer: Medicare Other | Admitting: *Deleted

## 2011-10-10 DIAGNOSIS — I498 Other specified cardiac arrhythmias: Secondary | ICD-10-CM

## 2011-10-10 LAB — PACEMAKER DEVICE OBSERVATION
AL AMPLITUDE: 2.8 mv
BAMS-0001: 175 {beats}/min
BATTERY VOLTAGE: 2.76 V
RV LEAD AMPLITUDE: 15.68 mv
RV LEAD IMPEDENCE PM: 617 Ohm
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
## 2011-10-10 NOTE — Telephone Encounter (Signed)
Pt needs to be seen in device clinic she is overdue  Was due in March  Please call
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
## 2011-10-10 NOTE — Progress Notes (Signed)
PPM check 

## 2011-10-10 NOTE — Telephone Encounter (Signed)
Spoke with patients grand-daughter  She is going to bring her in today to have her device checked

## 2011-10-10 NOTE — Telephone Encounter (Addendum)
Pt's dtr calling back thinking she needs an appt, requesting call asap because she needs an hour notice to get here, pls call paulette at [phone]

## 2012-02-21 ENCOUNTER — Encounter: Payer: Self-pay | Admitting: Internal Medicine

## 2012-02-21 ENCOUNTER — Ambulatory Visit (INDEPENDENT_AMBULATORY_CARE_PROVIDER_SITE_OTHER): Payer: Medicare Other | Admitting: Internal Medicine

## 2012-02-21 VITALS — BP 116/70 | HR 63 | Ht 63.0 in | Wt 142.8 lb

## 2012-02-21 DIAGNOSIS — I509 Heart failure, unspecified: Secondary | ICD-10-CM

## 2012-02-21 DIAGNOSIS — Z950 Presence of cardiac pacemaker: Secondary | ICD-10-CM

## 2012-02-21 DIAGNOSIS — I10 Essential (primary) hypertension: Secondary | ICD-10-CM

## 2012-02-21 LAB — PACEMAKER DEVICE OBSERVATION
AL AMPLITUDE: 2.8 mv
ATRIAL PACING PM: 78
BAMS-0001: 175 {beats}/min
BATTERY VOLTAGE: 2.76 V
VENTRICULAR PACING PM: 4

## 2012-02-21 NOTE — Assessment & Plan Note (Signed)
Her blood pressure is well controlled. I've asked the patient to continue her current medications and maintain a low-sodium diet.

## 2012-02-21 NOTE — Patient Instructions (Addendum)
Your physician wants you to follow-up in: 6 months in the device clinic and 12 months with Dr Taylor You will receive a reminder letter in the mail two months in advance. If you don't receive a letter, please call our office to schedule the follow-up appointment.  

## 2012-02-21 NOTE — Progress Notes (Signed)
HPI [name] returns today for followup. She is a very pleasant 78 year old woman with symptomatic bradycardia, status post permanent pacemaker insertion. She has hypertension and arthritis. In the interim, she has done well despite her advanced age. She denies chest pain or shortness of breath. She has not fallen but she does admit to some instability. She denies syncope, fevers, or chills. Her joint pains are diffuse. No Known Allergies   Current Outpatient Prescriptions  Medication Sig Dispense Refill  . amLODipine (NORVASC) 5 MG tablet Take 5 mg by mouth daily.      . citalopram (CELEXA) 20 MG tablet Take 20 mg by mouth daily.        Marland Kitchen dipyridamole-aspirin (AGGRENOX) 25-200 MG per 12 hr capsule Take 1 capsule by mouth 2 (two) times daily.        . fish oil-omega-3 fatty acids 1000 MG capsule Take 1 g by mouth daily.       Marland Kitchen gabapentin (NEURONTIN) 100 MG capsule Take 100 mg by mouth daily.       Marland Kitchen HYDROcodone-acetaminophen (LORCET) 10-650 MG per tablet Take 0.5-1 tablets by mouth every 6 (six) hours as needed. For pain.      Marland Kitchen levothyroxine (SYNTHROID, LEVOTHROID) 88 MCG tablet Take 88 mcg by mouth daily.      . Multiple Vitamin (MULTIVITAMIN) tablet Take 1 tablet by mouth daily.        . pravastatin (PRAVACHOL) 40 MG tablet Take 40 mg by mouth at bedtime.        . vitamin B-12 (CYANOCOBALAMIN) 1000 MCG tablet Take 1,000 mcg by mouth daily.      Marland Kitchen zolpidem (AMBIEN) 5 MG tablet Take 5 mg by mouth at bedtime.      . furosemide (LASIX) 20 MG tablet Take 20 mg by mouth as needed.          Past Medical History  Diagnosis Date  . Cardiac pacemaker in situ   . Dizziness and giddiness   . Other specified cardiac dysrhythmias     bradycardia  . Iron deficiency anemia, unspecified   . Unspecified hypothyroidism   . Unspecified essential hypertension   . Pure hypercholesterolemia   . Congestive heart failure, unspecified   . Osteoarthrosis, unspecified whether generalized or localized,  unspecified site     ROS:   All systems reviewed and negative except as noted in the HPI.   Past Surgical History  Procedure Date  . Pacemaker insertion 01/05/07    Medtronic, dual chamber. Dr. Lewayne Bunting MD.  . Bilateral total hip arthoplasties      Family History  Problem Relation Age of Onset  . Coronary artery disease Other   . Stroke Other   . COPD Other      History   Social History  . Marital Status: Married    Spouse Name: N/A    Number of Children: N/A  . Years of Education: N/A   Occupational History  . Not on file.   Social History Main Topics  . Smoking status: Never Smoker   . Smokeless tobacco: Not on file  . Alcohol Use: No  . Drug Use: No  . Sexually Active: Not on file   Other Topics Concern  . Not on file   Social History Narrative   Lives with her husband.      BP 116/70  Pulse 63  Ht 5\' 3"  (1.6 m)  Wt 142 lb 12.8 oz (64.774 kg)  BMI 25.30 kg/m2  Physical Exam:  Well appearing elderly woman, NAD HEENT: Unremarkable Neck:  No JVD, no thyromegally Lungs:  Clear with no wheezes, rales, or rhonchi. Well-healed pacemaker incision. HEART:  Regular rate rhythm, no murmurs, no rubs, no clicks Abd:  soft, positive bowel sounds, no organomegally, no rebound, no guarding Ext:  2 plus pulses, no edema, no cyanosis, no clubbing Skin:  No rashes no nodules Neuro:  CN II through XII intact, motor grossly intact  DEVICE  Normal device function.  See PaceArt for details.   Assess/Plan:

## 2012-02-21 NOTE — Assessment & Plan Note (Signed)
Her pacemaker is working normally. She has a Medtronic dual-chamber device. She is maintaining sinus rhythm. We'll plan to recheck in several months.

## 2012-03-27 ENCOUNTER — Other Ambulatory Visit: Payer: Self-pay | Admitting: Family Medicine

## 2012-03-27 DIAGNOSIS — M949 Disorder of cartilage, unspecified: Secondary | ICD-10-CM

## 2012-03-27 DIAGNOSIS — Z1231 Encounter for screening mammogram for malignant neoplasm of breast: Secondary | ICD-10-CM

## 2012-04-30 ENCOUNTER — Other Ambulatory Visit: Payer: Medicare Other

## 2012-04-30 ENCOUNTER — Ambulatory Visit: Payer: Medicare Other

## 2012-08-26 ENCOUNTER — Other Ambulatory Visit: Payer: Self-pay | Admitting: Internal Medicine

## 2012-08-26 ENCOUNTER — Ambulatory Visit (INDEPENDENT_AMBULATORY_CARE_PROVIDER_SITE_OTHER): Payer: Medicare Other | Admitting: *Deleted

## 2012-08-26 ENCOUNTER — Encounter: Payer: Self-pay | Admitting: Internal Medicine

## 2012-08-26 DIAGNOSIS — I498 Other specified cardiac arrhythmias: Secondary | ICD-10-CM

## 2012-08-26 DIAGNOSIS — I509 Heart failure, unspecified: Secondary | ICD-10-CM

## 2012-08-26 LAB — PACEMAKER DEVICE OBSERVATION
AL AMPLITUDE: 2.8 mv
AL THRESHOLD: 0.5 V
BAMS-0001: 175 {beats}/min
RV LEAD AMPLITUDE: 15.67 mv
RV LEAD THRESHOLD: 0.5 V

## 2012-08-26 NOTE — Progress Notes (Signed)
PPM check 

## 2013-01-13 ENCOUNTER — Encounter: Payer: Self-pay | Admitting: Internal Medicine

## 2013-02-25 ENCOUNTER — Encounter: Payer: Medicare Other | Admitting: Cardiology

## 2013-03-02 ENCOUNTER — Encounter: Payer: Self-pay | Admitting: Cardiology

## 2013-03-12 ENCOUNTER — Encounter: Payer: Medicare Other | Admitting: Cardiology

## 2013-03-23 ENCOUNTER — Encounter: Payer: Medicare Other | Admitting: Cardiology

## 2013-03-26 ENCOUNTER — Ambulatory Visit (INDEPENDENT_AMBULATORY_CARE_PROVIDER_SITE_OTHER): Payer: Medicare Other | Admitting: *Deleted

## 2013-03-26 ENCOUNTER — Encounter: Payer: Medicare Other | Admitting: Cardiology

## 2013-03-26 DIAGNOSIS — I498 Other specified cardiac arrhythmias: Secondary | ICD-10-CM

## 2013-03-26 DIAGNOSIS — Z950 Presence of cardiac pacemaker: Secondary | ICD-10-CM

## 2013-03-26 LAB — PACEMAKER DEVICE OBSERVATION
AL AMPLITUDE: 2 mv
ATRIAL PACING PM: 72
BAMS-0001: 175 {beats}/min
BATTERY VOLTAGE: 2.75 V
RV LEAD IMPEDENCE PM: 614 Ohm
VENTRICULAR PACING PM: 0

## 2013-03-26 NOTE — Progress Notes (Signed)
Device check in clinic, all functions normal, no changes made, full details in PaceArt.  ROV w/ Brooke in 40mo.

## 2013-03-30 ENCOUNTER — Encounter: Payer: Medicare Other | Admitting: Cardiology

## 2013-04-01 ENCOUNTER — Encounter: Payer: Self-pay | Admitting: Internal Medicine

## 2013-04-27 ENCOUNTER — Encounter (HOSPITAL_COMMUNITY): Payer: Self-pay | Admitting: Emergency Medicine

## 2013-04-27 ENCOUNTER — Emergency Department (HOSPITAL_COMMUNITY): Payer: Medicare Other

## 2013-04-27 ENCOUNTER — Observation Stay (HOSPITAL_COMMUNITY): Payer: Medicare Other

## 2013-04-27 ENCOUNTER — Observation Stay (HOSPITAL_COMMUNITY)
Admission: EM | Admit: 2013-04-27 | Discharge: 2013-04-28 | Disposition: A | Discharge status: Home / Self Care | Payer: Medicare Other | Attending: Internal Medicine | Admitting: Internal Medicine

## 2013-04-27 DIAGNOSIS — E7800 Pure hypercholesterolemia, unspecified: Secondary | ICD-10-CM | POA: Diagnosis not present

## 2013-04-27 DIAGNOSIS — F29 Unspecified psychosis not due to a substance or known physiological condition: Principal | ICD-10-CM | POA: Insufficient documentation

## 2013-04-27 DIAGNOSIS — D509 Iron deficiency anemia, unspecified: Secondary | ICD-10-CM | POA: Diagnosis not present

## 2013-04-27 DIAGNOSIS — R4182 Altered mental status, unspecified: Secondary | ICD-10-CM | POA: Insufficient documentation

## 2013-04-27 DIAGNOSIS — M129 Arthropathy, unspecified: Secondary | ICD-10-CM | POA: Insufficient documentation

## 2013-04-27 DIAGNOSIS — I509 Heart failure, unspecified: Secondary | ICD-10-CM | POA: Insufficient documentation

## 2013-04-27 DIAGNOSIS — M1990 Unspecified osteoarthritis, unspecified site: Secondary | ICD-10-CM

## 2013-04-27 DIAGNOSIS — R42 Dizziness and giddiness: Secondary | ICD-10-CM | POA: Diagnosis not present

## 2013-04-27 DIAGNOSIS — Z8673 Personal history of transient ischemic attack (TIA), and cerebral infarction without residual deficits: Secondary | ICD-10-CM

## 2013-04-27 DIAGNOSIS — Z950 Presence of cardiac pacemaker: Secondary | ICD-10-CM | POA: Insufficient documentation

## 2013-04-27 DIAGNOSIS — E039 Hypothyroidism, unspecified: Secondary | ICD-10-CM | POA: Diagnosis not present

## 2013-04-27 DIAGNOSIS — I498 Other specified cardiac arrhythmias: Secondary | ICD-10-CM

## 2013-04-27 DIAGNOSIS — R079 Chest pain, unspecified: Secondary | ICD-10-CM | POA: Diagnosis present

## 2013-04-27 DIAGNOSIS — R410 Disorientation, unspecified: Secondary | ICD-10-CM | POA: Diagnosis present

## 2013-04-27 DIAGNOSIS — Z79899 Other long term (current) drug therapy: Secondary | ICD-10-CM | POA: Insufficient documentation

## 2013-04-27 DIAGNOSIS — I10 Essential (primary) hypertension: Secondary | ICD-10-CM | POA: Diagnosis present

## 2013-04-27 HISTORY — DX: Other complications of anesthesia, initial encounter: T88.59XA

## 2013-04-27 HISTORY — DX: Transient cerebral ischemic attack, unspecified: G45.9

## 2013-04-27 HISTORY — DX: Disorientation, unspecified: R41.0

## 2013-04-27 HISTORY — DX: Adverse effect of unspecified anesthetic, initial encounter: T41.45XA

## 2013-04-27 HISTORY — DX: Overactive bladder: N32.81

## 2013-04-27 LAB — URINALYSIS, ROUTINE W REFLEX MICROSCOPIC
Bilirubin Urine: NEGATIVE
Ketones, ur: NEGATIVE mg/dL
Nitrite: NEGATIVE
Protein, ur: NEGATIVE mg/dL
Urobilinogen, UA: 1 mg/dL (ref 0.0–1.0)

## 2013-04-27 LAB — CBC WITH DIFFERENTIAL/PLATELET
Basophils Absolute: 0 10*3/uL (ref 0.0–0.1)
Eosinophils Relative: 3 % (ref 0–5)
Hemoglobin: 12 g/dL (ref 12.0–15.0)
Lymphocytes Relative: 24 % (ref 12–46)
Lymphs Abs: 1.4 10*3/uL (ref 0.7–4.0)
MCV: 99.4 fL (ref 78.0–100.0)
Monocytes Relative: 13 % — ABNORMAL HIGH (ref 3–12)
Neutro Abs: 3.3 10*3/uL (ref 1.7–7.7)
Neutrophils Relative %: 59 % (ref 43–77)
Platelets: 224 10*3/uL (ref 150–400)
RBC: 3.62 MIL/uL — ABNORMAL LOW (ref 3.87–5.11)
RDW: 13 % (ref 11.5–15.5)
WBC: 5.6 10*3/uL (ref 4.0–10.5)

## 2013-04-27 LAB — COMPREHENSIVE METABOLIC PANEL
ALT: 9 U/L (ref 0–35)
Alkaline Phosphatase: 51 U/L (ref 39–117)
CO2: 26 mEq/L (ref 19–32)
Chloride: 103 mEq/L (ref 96–112)
GFR calc Af Amer: 88 mL/min — ABNORMAL LOW (ref >90)
GFR calc non Af Amer: 76 mL/min — ABNORMAL LOW (ref >90)
Glucose, Bld: 103 mg/dL — ABNORMAL HIGH (ref 70–99)
Potassium: 3.9 mEq/L (ref 3.5–5.1)
Sodium: 138 mEq/L (ref 135–145)
Total Bilirubin: 0.8 mg/dL (ref 0.3–1.2)

## 2013-04-27 LAB — CBC
HCT: 36.1 % (ref 36.0–46.0)
Hemoglobin: 12.2 g/dL (ref 12.0–15.0)
MCV: 98.6 fL (ref 78.0–100.0)
RBC: 3.66 MIL/uL — ABNORMAL LOW (ref 3.87–5.11)
RDW: 12.9 % (ref 11.5–15.5)

## 2013-04-27 LAB — TSH: TSH: 0.069 u[IU]/mL — ABNORMAL LOW (ref 0.350–4.500)

## 2013-04-27 LAB — URINE MICROSCOPIC-ADD ON

## 2013-04-27 LAB — ACETAMINOPHEN LEVEL: Acetaminophen (Tylenol), Serum: <15 ug/mL (ref 10–30)

## 2013-04-27 LAB — CREATININE, SERUM
Creatinine, Ser: 0.59 mg/dL (ref 0.50–1.10)
GFR calc Af Amer: 89 mL/min — ABNORMAL LOW (ref >90)
GFR calc non Af Amer: 77 mL/min — ABNORMAL LOW (ref >90)

## 2013-04-27 LAB — TROPONIN I: Troponin I: <0.3 ng/mL (ref <0.30)

## 2013-04-27 MED ORDER — ACETAMINOPHEN 325 MG PO TABS: 650.0000 mg | ORAL_TABLET | Freq: Four times a day (QID) | ORAL | Status: DC | PRN

## 2013-04-27 MED ORDER — ATENOLOL 25 MG PO TABS
25.0000 mg | ORAL_TABLET | Freq: Every day | ORAL | Status: DC | PRN
  Filled 2013-04-27: qty 1

## 2013-04-27 MED ORDER — ONDANSETRON HCL 4 MG PO TABS: 4.0000 mg | ORAL_TABLET | Freq: Four times a day (QID) | ORAL | Status: DC | PRN

## 2013-04-27 MED ORDER — AMLODIPINE BESYLATE 5 MG PO TABS
5.0000 mg | ORAL_TABLET | Freq: Every day | ORAL | Status: DC
  Administered 2013-04-28: 5 mg via ORAL
  Filled 2013-04-27 (×2): qty 1

## 2013-04-27 MED ORDER — ASPIRIN-DIPYRIDAMOLE ER 25-200 MG PO CP12
1.0000 | ORAL_CAPSULE | Freq: Two times a day (BID) | ORAL | Status: DC
  Administered 2013-04-28: 1 via ORAL
  Filled 2013-04-27 (×3): qty 1

## 2013-04-27 MED ORDER — POTASSIUM CHLORIDE CRYS ER 20 MEQ PO TBCR: 10.0000 meq | EXTENDED_RELEASE_TABLET | Freq: Every day | ORAL | Status: DC | PRN

## 2013-04-27 MED ORDER — LORAZEPAM 1 MG PO TABS
1.0000 mg | ORAL_TABLET | Freq: Once | ORAL | Status: AC
  Administered 2013-04-27: 1 mg via ORAL
  Filled 2013-04-27: qty 1

## 2013-04-27 MED ORDER — ACETAMINOPHEN 650 MG RE SUPP: 650.0000 mg | Freq: Four times a day (QID) | RECTAL | Status: DC | PRN

## 2013-04-27 MED ORDER — IOHEXOL 350 MG/ML SOLN
50.0000 mL | Freq: Once | INTRAVENOUS | Status: AC | PRN
  Administered 2013-04-27: 50 mL via INTRAVENOUS

## 2013-04-27 MED ORDER — HYDRALAZINE HCL 20 MG/ML IJ SOLN
10.0000 mg | Freq: Four times a day (QID) | INTRAMUSCULAR | Status: DC | PRN
  Administered 2013-04-27 – 2013-04-28 (×2): 10 mg via INTRAVENOUS
  Filled 2013-04-27 (×2): qty 1

## 2013-04-27 MED ORDER — FUROSEMIDE 20 MG PO TABS
20.0000 mg | ORAL_TABLET | ORAL | Status: DC | PRN
  Filled 2013-04-27: qty 1

## 2013-04-27 MED ORDER — SODIUM CHLORIDE 0.9 % IJ SOLN
3.0000 mL | Freq: Two times a day (BID) | INTRAMUSCULAR | Status: DC
  Administered 2013-04-27 – 2013-04-28 (×2): 3 mL via INTRAVENOUS

## 2013-04-27 MED ORDER — ENOXAPARIN SODIUM 40 MG/0.4ML ~~LOC~~ SOLN
40.0000 mg | SUBCUTANEOUS | Status: DC
  Administered 2013-04-27: 40 mg via SUBCUTANEOUS
  Filled 2013-04-27 (×2): qty 0.4

## 2013-04-27 MED ORDER — ONDANSETRON HCL 4 MG/2ML IJ SOLN: 4.0000 mg | Freq: Four times a day (QID) | INTRAMUSCULAR | Status: DC | PRN

## 2013-04-27 MED ORDER — GABAPENTIN 100 MG PO CAPS
100.0000 mg | ORAL_CAPSULE | Freq: Two times a day (BID) | ORAL | Status: DC
  Administered 2013-04-28: 100 mg via ORAL
  Filled 2013-04-27 (×3): qty 1

## 2013-04-27 MED ORDER — LEVOTHYROXINE SODIUM 100 MCG PO TABS
100.0000 ug | ORAL_TABLET | Freq: Every day | ORAL | Status: DC
  Administered 2013-04-28: 100 ug via ORAL
  Filled 2013-04-27 (×2): qty 1

## 2013-04-27 MED ORDER — SIMVASTATIN 20 MG PO TABS
20.0000 mg | ORAL_TABLET | Freq: Every day | ORAL | Status: DC
  Filled 2013-04-27 (×2): qty 1

## 2013-04-27 MED ORDER — LISINOPRIL 40 MG PO TABS
40.0000 mg | ORAL_TABLET | Freq: Every day | ORAL | Status: DC
  Administered 2013-04-28: 40 mg via ORAL
  Filled 2013-04-27 (×2): qty 1

## 2013-04-27 NOTE — Progress Notes (Signed)
Pt scheduled to have norvasc and lisinopril as well as zocor. I spoke with Dr. York Spaniel and since we didn't know when pt took all the medications that her daughter stated she had taken a weeks worth of meds since Saturday, he wanted to hold off but gave me a prn order for hydralazine for bp. Will closely monitor. Daughter also expressed desire for her mother to have an anti-anxiety med and I addressed with MD for which he didn't want to place an order since patient still confused. I informed family.

## 2013-04-27 NOTE — ED Notes (Signed)
Patient transported to CT 

## 2013-04-27 NOTE — H&P (Addendum)
[facility] History and Physical  [name]:[id number] DOB: 1918/10/27 DOA: 04/27/2013  Referring physician:  PCP: Ailene Ravel, MD  Specialists:   Chief Complaint: confusion   HPI: [name] is a 79 y.o. female with PMH of HTN, HPL, h/o CVA, s/p PPM, chronic pain, brought by family for evaluation of confusion; per family: several pills missing from her mothers med/dispenser including narcotics; patient denies any focal weakness, no paraesthesia, no chest pain, no SOB, no nausea, vomiting or diarrhea, no fall, no fever;  -she is oriented x 2;   Review of Systems: The patient denies anorexia, fever, weight loss,, vision loss, decreased hearing, hoarseness, chest pain, syncope, dyspnea on exertion, peripheral edema, balance deficits, hemoptysis, abdominal pain, melena, hematochezia, severe indigestion/heartburn, hematuria, incontinence, genital sores, muscle weakness, suspicious skin lesions, transient blindness, difficulty walking, depression, unusual weight change, abnormal bleeding, enlarged lymph nodes, angioedema, and breast masses.    Past Medical History  Diagnosis Date  . Cardiac pacemaker in situ   . Dizziness and giddiness   . Other specified cardiac dysrhythmias(427.89)     bradycardia  . Iron deficiency anemia, unspecified   . Unspecified hypothyroidism   . Unspecified essential hypertension   . Pure hypercholesterolemia   . Congestive heart failure, unspecified   . Osteoarthrosis, unspecified whether generalized or localized, unspecified site    Past Surgical History  Procedure Laterality Date  . Pacemaker insertion  01/05/07    Medtronic, dual chamber. Dr. Lewayne Bunting MD.  . Bilateral total hip arthoplasties     Social History:  reports that she has never smoked. She does not have any smokeless tobacco history on file. She reports that she does not drink alcohol or use illicit drugs. Home:  where does patient live--home, ALF, SNF? and with  whom if at home? Yes:  Can patient participate in ADLs?  No Known Allergies  Family History  Problem Relation Age of Onset  . Coronary artery disease Other   . Stroke Other   . COPD Other     (be sure to complete)  Prior to Admission medications   Medication Sig Start Date End Date Taking? Authorizing Provider  amLODipine (NORVASC) 5 MG tablet Take 5 mg by mouth daily.   Yes Historical Provider, MD  atenolol (TENORMIN) 50 MG tablet Take 25 mg by mouth daily as needed (if blood pressure is abover 160/100).   Yes Historical Provider, MD  citalopram (CELEXA) 20 MG tablet Take 20 mg by mouth daily.     Yes Historical Provider, MD  dipyridamole-aspirin (AGGRENOX) 25-200 MG per 12 hr capsule Take 1 capsule by mouth 2 (two) times daily.     Yes Historical Provider, MD  fish oil-omega-3 fatty acids 1000 MG capsule Take 1 g by mouth daily.    Yes Historical Provider, MD  furosemide (LASIX) 20 MG tablet Take 20 mg by mouth as needed for fluid (take with potassium).    Yes Historical Provider, MD  gabapentin (NEURONTIN) 100 MG capsule Take 100 mg by mouth 2 (two) times daily.    Yes Historical Provider, MD  levothyroxine (SYNTHROID, LEVOTHROID) 100 MCG tablet Take 100 mcg by mouth daily before breakfast.   Yes Historical Provider, MD  lisinopril (PRINIVIL,ZESTRIL) 40 MG tablet Take 40 mg by mouth daily.   Yes Historical Provider, MD  Multiple Vitamin (MULTIVITAMIN) tablet Take 1 tablet by mouth daily.     Yes Historical Provider, MD  oxyCODONE-acetaminophen (PERCOCET) 10-325 MG per tablet Take 0.5-1 tablets by mouth every  6 (six) hours as needed for pain.   Yes Historical Provider, MD  potassium chloride (K-DUR,KLOR-CON) 10 MEQ tablet Take 10 mEq by mouth daily as needed (take with furosemide).   Yes Historical Provider, MD  pravastatin (PRAVACHOL) 40 MG tablet Take 40 mg by mouth at bedtime.     Yes Historical Provider, MD  solifenacin (VESICARE) 5 MG tablet Take 5 mg by mouth daily.   Yes  Historical Provider, MD  traZODone (DESYREL) 100 MG tablet Take 50-100 mg by mouth at bedtime as needed for sleep.   Yes Historical Provider, MD  vitamin B-12 (CYANOCOBALAMIN) 1000 MCG tablet Take 1,000 mcg by mouth daily.   Yes Historical Provider, MD   Physical Exam: Filed Vitals:   04/27/13 1253  BP: 184/75  Pulse:   Temp: 98.8 F (37.1 C)  Resp: 19     General:  Alert, oriented x 2; disoriented to time   Eyes: EOM-i, PERRLA  ENT: no oral ulcers   Neck: supple   Cardiovascular: s1,s2 rrr  Respiratory: CTA BL   Abdomen: soft, nt ,nd   Skin: no rash   Musculoskeletal: no LE edema   Psychiatric: no hallucinations   Neurologic: CN 2-123 intact; motor 5/5 BL symmetric   Labs on Admission:  Basic Metabolic Panel:  Recent Labs Lab 04/27/13 0920  NA 138  K 3.9  CL 103  CO2 26  GLUCOSE 103*  BUN 21  CREATININE 0.62  CALCIUM 8.7   Liver Function Tests:  Recent Labs Lab 04/27/13 0920  AST 16  ALT 9  ALKPHOS 51  BILITOT 0.8  PROT 6.2  ALBUMIN 3.6   No results found for this basename: LIPASE, AMYLASE,  in the last 168 hours No results found for this basename: AMMONIA,  in the last 168 hours CBC:  Recent Labs Lab 04/27/13 0920  WBC 5.6  NEUTROABS 3.3  HGB 12.0  HCT 36.0  MCV 99.4  PLT 224   Cardiac Enzymes:  Recent Labs Lab 04/27/13 0920  TROPONINI <0.30    BNP (last 3 results) No results found for this basename: PROBNP,  in the last 8760 hours CBG: No results found for this basename: GLUCAP,  in the last 168 hours  Radiological Exams on Admission: Ct Head Wo Contrast  04/27/2013   CLINICAL DATA:  Altered mental status.  EXAM: CT HEAD WITHOUT CONTRAST  TECHNIQUE: Contiguous axial images were obtained from the base of the skull through the vertex without intravenous contrast.  COMPARISON:  01/01/2013.  FINDINGS: No intracranial hemorrhage.  No CT evidence of large acute infarct.  Small vessel disease type changes.  No intracranial mass  lesion noted on this unenhanced exam.  No hydrocephalus.  Vascular calcifications.  IMPRESSION: No acute abnormality.   Electronically Signed   By: Bridgett Larsson M.D.   On: 04/27/2013 10:07   Dg Chest Port 1 View  04/27/2013   CLINICAL DATA:  Altered mental status.  EXAM: PORTABLE CHEST - 1 VIEW  COMPARISON:  01/01/2013 right shoulder films and 01/06/2007 chest x-ray.  FINDINGS: Sequential pacemaker enters from the left with leads appearing to be in the region of the right atrium and right ventricle. Cardiomegaly.  Central pulmonary vascular prominence.  No segmental consolidation or gross pneumothorax.  Calcified aorta.  Prominent bilateral shoulder joint degenerative changes.  IMPRESSION: Cardiomegaly with pacemaker in place.  Central pulmonary vascular prominence without pulmonary edema.  No segmental consolidation.  Calcified mildly tortuous aorta.   Electronically Signed   By: Brett Canales  Constance Goltz M.D.   On: 04/27/2013 09:29    EKG: Independently reviewed. NSR, no acute changes   Assessment/Plan Principal Problem:   Confusion Active Problems:   HYPOTHYROIDISM   HYPERTENSION   H/O: CVA (cerebrovascular accident)  79 y.o. female with PMH of HTN, HPL, h/o CVA, s/p PPM, hypothyroidism, chronic pain, brought by family for evaluation of confusion  1. Confusion of unclear etiology; ? Medication overdose/narcotics (per family missing pills) on top possible underlying dementia (had memory issues for several month); no s/s of infection; CXR, UA unremarkable;  -neuro exam no focal; CT head: no acute findings; can't do MRI due to PPM -obs on tele; CT w-contrast; cont aggrenox; hold opioids/sedative, vesicare  2. HTN uncontrolled; resume home meds/titrate   3. H/o CVA; as above   4. Hypothyroidism, cont levothyroxine, check tsh   5. H/o CHF; clinically euvolemic; cont home regimen   None:  if consultant consulted, please document name and whether formally or informally consulted  Code Status: full  (must indicate code status--if unknown or must be presumed, indicate so) Family Communication: daughter at the bedside (indicate person spoken with, if applicable, with phone number if by telephone) Disposition Plan: home likely in AM if stable  (indicate anticipated LOS)  Time spent: >35 minutes   [name] [facility] Pager [phone]  If 7PM-7AM, please contact night-coverage [email or web address] Password [facility] 04/27/2013, 3:16 PM

## 2013-04-27 NOTE — ED Provider Notes (Signed)
CSN: [id number]     Arrival date & time 04/27/13  0809 History   First MD Initiated Contact with Patient 04/27/13 [phone]     Chief Complaint  Patient presents with  . Chest Pain   (Consider location/radiation/quality/duration/timing/severity/associated sxs/prior Treatment) Patient is a 79 y.o. female presenting with altered mental status.  Altered Mental Status Presenting symptoms: confusion   Severity:  Moderate Most recent episode:  Today Episode history:  Continuous Duration: several hours. Timing:  Constant Progression:  Unchanged Chronicity:  New Context: not taking medications as prescribed   Associated symptoms: no abdominal pain, no fever, no nausea, no vomiting and no weakness   Associated symptoms comment:  Chest tightness   Past Medical History  Diagnosis Date  . Cardiac pacemaker in situ   . Dizziness and giddiness   . Other specified cardiac dysrhythmias(427.89)     bradycardia  . Iron deficiency anemia, unspecified   . Unspecified hypothyroidism   . Unspecified essential hypertension   . Pure hypercholesterolemia   . Congestive heart failure, unspecified   . Osteoarthrosis, unspecified whether generalized or localized, unspecified site    Past Surgical History  Procedure Laterality Date  . Pacemaker insertion  01/05/07    Medtronic, dual chamber. Dr. Lewayne Bunting MD.  . Bilateral total hip arthoplasties     Family History  Problem Relation Age of Onset  . Coronary artery disease Other   . Stroke Other   . COPD Other    History  Substance Use Topics  . Smoking status: Never Smoker   . Smokeless tobacco: Not on file  . Alcohol Use: No   OB History   Grav Para Term Preterm Abortions TAB SAB Ect Mult Living                 Review of Systems  Constitutional: Negative for fever.  HENT: Negative for congestion.   Respiratory: Negative for cough and shortness of breath.   Cardiovascular: Negative for chest pain.  Gastrointestinal: Negative for  nausea, vomiting, abdominal pain and diarrhea.  Neurological: Negative for weakness.  Psychiatric/Behavioral: Positive for confusion.  All other systems reviewed and are negative.    Allergies  Review of patient's allergies indicates no known allergies.  Home Medications   Current Outpatient Rx  Name  Route  Sig  Dispense  Refill  . amLODipine (NORVASC) 5 MG tablet   Oral   Take 5 mg by mouth daily.         Marland Kitchen atenolol (TENORMIN) 50 MG tablet   Oral   Take 25 mg by mouth daily as needed (if blood pressure is abover 160/100).         . citalopram (CELEXA) 20 MG tablet   Oral   Take 20 mg by mouth daily.           Marland Kitchen dipyridamole-aspirin (AGGRENOX) 25-200 MG per 12 hr capsule   Oral   Take 1 capsule by mouth 2 (two) times daily.           . fish oil-omega-3 fatty acids 1000 MG capsule   Oral   Take 1 g by mouth daily.          . furosemide (LASIX) 20 MG tablet   Oral   Take 20 mg by mouth as needed for fluid (take with potassium).          Marland Kitchen gabapentin (NEURONTIN) 100 MG capsule   Oral   Take 100 mg by mouth 2 (two) times daily.          Marland Kitchen  levothyroxine (SYNTHROID, LEVOTHROID) 100 MCG tablet   Oral   Take 100 mcg by mouth daily before breakfast.         . lisinopril (PRINIVIL,ZESTRIL) 40 MG tablet   Oral   Take 40 mg by mouth daily.         . Multiple Vitamin (MULTIVITAMIN) tablet   Oral   Take 1 tablet by mouth daily.           Marland Kitchen oxyCODONE-acetaminophen (PERCOCET) 10-325 MG per tablet   Oral   Take 0.5-1 tablets by mouth every 6 (six) hours as needed for pain.         . potassium chloride (K-DUR,KLOR-CON) 10 MEQ tablet   Oral   Take 10 mEq by mouth daily as needed (take with furosemide).         . pravastatin (PRAVACHOL) 40 MG tablet   Oral   Take 40 mg by mouth at bedtime.           . solifenacin (VESICARE) 5 MG tablet   Oral   Take 5 mg by mouth daily.         . traZODone (DESYREL) 100 MG tablet   Oral   Take 50-100 mg  by mouth at bedtime as needed for sleep.         . vitamin B-12 (CYANOCOBALAMIN) 1000 MCG tablet   Oral   Take 1,000 mcg by mouth daily.          BP 158/72  Pulse 66  Temp(Src) 98.4 F (36.9 C) (Oral)  Resp 20  SpO2 96% Physical Exam  Nursing note and vitals reviewed. Constitutional: She appears well-developed and well-nourished. No distress.  HENT:  Head: Normocephalic and atraumatic.  Mouth/Throat: Oropharynx is clear and moist.  Eyes: Conjunctivae are normal. No scleral icterus. Right pupil is reactive. Left pupil is reactive. Pupils are unequal (right pupil slightly larger than left. ).  Neck: Neck supple.  Cardiovascular: Normal rate, regular rhythm, normal heart sounds and intact distal pulses.   No murmur heard. Pulmonary/Chest: Effort normal and breath sounds normal. No stridor. No respiratory distress. She has no rales.  Abdominal: Soft. Bowel sounds are normal. She exhibits no distension. There is no tenderness.  Musculoskeletal: Normal range of motion.  Neurological: She is alert. She has normal strength. She is disoriented (disoriented to time.  knows she in the ER, but not which ER.).  Skin: Skin is warm and dry. No rash noted.  Psychiatric: She has a normal mood and affect. Her behavior is normal.    ED Course  Procedures (including critical care time) Labs Review Labs Reviewed  CBC WITH DIFFERENTIAL - Abnormal; Notable for the following:    RBC 3.62 (*)    Monocytes Relative 13 (*)    All other components within normal limits  COMPREHENSIVE METABOLIC PANEL - Abnormal; Notable for the following:    Glucose, Bld 103 (*)    GFR calc non Af Amer 76 (*)    GFR calc Af Amer 88 (*)    All other components within normal limits  URINALYSIS, ROUTINE W REFLEX MICROSCOPIC - Abnormal; Notable for the following:    Leukocytes, UA TRACE (*)    All other components within normal limits  SALICYLATE LEVEL - Abnormal; Notable for the following:    Salicylate Lvl <2.0  (*)    All other components within normal limits  URINE MICROSCOPIC-ADD ON - Abnormal; Notable for the following:    Casts HYALINE CASTS (*)  All other components within normal limits  CBC - Abnormal; Notable for the following:    RBC 3.66 (*)    All other components within normal limits  CREATININE, SERUM - Abnormal; Notable for the following:    GFR calc non Af Amer 77 (*)    GFR calc Af Amer 89 (*)    All other components within normal limits  TROPONIN I  ACETAMINOPHEN LEVEL  TSH   Imaging Review Ct Head Wo Contrast  04/27/2013   CLINICAL DATA:  Altered mental status.  EXAM: CT HEAD WITHOUT CONTRAST  TECHNIQUE: Contiguous axial images were obtained from the base of the skull through the vertex without intravenous contrast.  COMPARISON:  01/01/2013.  FINDINGS: No intracranial hemorrhage.  No CT evidence of large acute infarct.  Small vessel disease type changes.  No intracranial mass lesion noted on this unenhanced exam.  No hydrocephalus.  Vascular calcifications.  IMPRESSION: No acute abnormality.   Electronically Signed   By: Bridgett Larsson M.D.   On: 04/27/2013 10:07   Dg Chest Port 1 View  04/27/2013   CLINICAL DATA:  Altered mental status.  EXAM: PORTABLE CHEST - 1 VIEW  COMPARISON:  01/01/2013 right shoulder films and 01/06/2007 chest x-ray.  FINDINGS: Sequential pacemaker enters from the left with leads appearing to be in the region of the right atrium and right ventricle. Cardiomegaly.  Central pulmonary vascular prominence.  No segmental consolidation or gross pneumothorax.  Calcified aorta.  Prominent bilateral shoulder joint degenerative changes.  IMPRESSION: Cardiomegaly with pacemaker in place.  Central pulmonary vascular prominence without pulmonary edema.  No segmental consolidation.  Calcified mildly tortuous aorta.   Electronically Signed   By: Bridgett Larsson M.D.   On: 04/27/2013 09:29  All radiology studies independently viewed by me.     EKG Interpretation      Ventricular Rate:  66 PR Interval:  297 QRS Duration: 87 QT Interval:  428 QTC Calculation: 448 R Axis:   -26 Text Interpretation:  Sinus rhythm Prolonged PR interval Borderline left axis deviation since prior tracing, pvc's no longer present.             MDM   1. Confusion   2. H/O: CVA (cerebrovascular accident)   3. Unspecified essential hypertension   4. Unspecified hypothyroidism    79 yo female presenting with her daughter with complaints of confusion. Patient is unable to provide specific history, but denies any complaints currently. Her daughter states she complained of chest tightness on the way over here, but patient denies this now. Her daughter reports that there are several pills missing from her mothers medicine dispenser.  She brings a list of potentially taking medications, which was prepared by the patient's granddaughter. Apparently, the medication dispenser was filled by patient's granddaughter 4 days ago. Now there are medications missing from various days.  It is unclear whether she took these missing medications, and if so, when. Missing meds include amlodipine, citalopram, aggrenox, fish oil, gabapentin, synthroid, MVI, pravastatin, vesicare, b 12.  Possibly also percocet.  She is slightly confused, but in no distress.  Discussed with [place] poison control, who rec'd monitoring for at least 6 hours.   After 6 hours, pt remained disoriented to time and had some other mild memory deficits.  Daughter reports that this is atypical for patient.  Consulted internal medicine for admission for further obs and workup.      Candyce Churn, MD 04/27/13 [phone]

## 2013-04-27 NOTE — ED Notes (Signed)
Pt and family member reports that pt lives at home with her husband, they have meds seperated in med container for the week, pt has taken too many days worth of her daily meds. Pt denies that and only complains of mid chest tightness for several days.

## 2013-04-28 MED ORDER — TRAZODONE HCL 100 MG PO TABS: 50.0000 mg | ORAL_TABLET | Freq: Every evening | ORAL | Status: DC | PRN

## 2013-04-28 MED ORDER — CITALOPRAM HYDROBROMIDE 20 MG PO TABS
20.0000 mg | ORAL_TABLET | Freq: Every day | ORAL | Status: DC
  Administered 2013-04-28: 20 mg via ORAL
  Filled 2013-04-28: qty 1

## 2013-04-28 MED ORDER — TRAZODONE HCL 50 MG PO TABS
50.0000 mg | ORAL_TABLET | Freq: Every evening | ORAL | Status: DC | PRN
  Filled 2013-04-28: qty 1

## 2013-04-28 MED ORDER — OMEGA-3 FATTY ACIDS 1000 MG PO CAPS: 1.0000 g | ORAL_CAPSULE | Freq: Every day | ORAL | Status: DC

## 2013-04-28 MED ORDER — VITAMIN B-12 1000 MCG PO TABS
1000.0000 ug | ORAL_TABLET | Freq: Every day | ORAL | Status: DC
  Administered 2013-04-28: 1000 ug via ORAL
  Filled 2013-04-28: qty 1

## 2013-04-28 MED ORDER — OXYCODONE-ACETAMINOPHEN 10-325 MG PO TABS: 0.5000 | ORAL_TABLET | Freq: Three times a day (TID) | ORAL | Status: DC | PRN

## 2013-04-28 MED ORDER — ADULT MULTIVITAMIN W/MINERALS CH
1.0000 | ORAL_TABLET | Freq: Every day | ORAL | Status: DC
  Administered 2013-04-28: 1 via ORAL
  Filled 2013-04-28: qty 1

## 2013-04-28 MED ORDER — OMEGA-3-ACID ETHYL ESTERS 1 G PO CAPS
1.0000 g | ORAL_CAPSULE | Freq: Every day | ORAL | Status: DC
  Administered 2013-04-28: 1 g via ORAL
  Filled 2013-04-28: qty 1

## 2013-04-28 MED ORDER — ONE-DAILY MULTI VITAMINS PO TABS: 1.0000 | ORAL_TABLET | Freq: Every day | ORAL | Status: DC

## 2013-04-28 MED ORDER — DARIFENACIN HYDROBROMIDE ER 7.5 MG PO TB24
7.5000 mg | ORAL_TABLET | Freq: Every day | ORAL | Status: DC
  Administered 2013-04-28: 7.5 mg via ORAL
  Filled 2013-04-28: qty 1

## 2013-04-28 NOTE — Evaluation (Signed)
Occupational Therapy Evaluation Patient Details Name: TOMIKA ECKLES MRN: [id number] DOB: 12/19/1918 Today's Date: 04/28/2013 Time: 1410-1455 OT Time Calculation (min): 45 min  OT Assessment / Plan / Recommendation History of present illness 79yo admitted 2/2 confusion, likely unintentional med overdose   Clinical Impression   Pt in bed willing to work w/OT. Pt's dtr & 2 sons present. Family concerned about follow-up therapy once pt returns home w/79yo husband of past 71 years. Dtr states pt w/recent history of falls (approx 4-5 in past 3 months) and perceived LE/core weakness. Pt demo'd ability to get in/out of bed w/o physical assist. She amb in room w/min guard and SPC initially. She was at supervision level for toilet txfrs and (I) w/clothing mgmt & hygiene. Pt amb in hallway w/SPC approx 115ft w/o LOB and close stand by assist. Pt needed cues during stand>sit as she does not reach back for chair and "plops" down consistently. Family states pt has necessary DME at home.     OT Assessment  All further OT needs can be met in the next venue of care    Follow Up Recommendations  Home health OT;Supervision/Assistance - 24 hour    Barriers to Discharge      Equipment Recommendations       Recommendations for Other Services    Frequency       Precautions / Restrictions Precautions Precautions: Fall Precaution Comments: pt w/recent hx of falls at home (4-5 past 3 months) Restrictions Weight Bearing Restrictions: No   Pertinent Vitals/Pain Denies     ADL  Grooming: Performed;Wash/dry hands;Wash/dry face;Teeth care Where Assessed - Grooming: Supported sitting Toilet Transfer: Research scientist (life sciences) Method: Surveyor, minerals: Regular height toilet;Grab bars Toileting - Architect and Hygiene: Performed;Independent Where Assessed - Toileting Clothing Manipulation and Hygiene: Sit to stand from 3-in-1 or toilet    OT  Diagnosis: Generalized weakness  OT Problem List: Decreased activity tolerance;Impaired balance (sitting and/or standing) OT Treatment Interventions:     OT Goals(Current goals can be found in the care plan section) Acute Rehab OT Goals Patient Stated Goal: Go home with husband OT Goal Formulation: With patient/family Time For Goal Achievement: 05/01/13 Potential to Achieve Goals: Good  Visit Information  Last OT Received On: 04/28/13 Assistance Needed: +1 History of Present Illness: 79yo admitted 2/2 confusion, likely unintentional med overdose       Prior Functioning     Home Living Family/patient expects to be discharged to:: Private residence Living Arrangements: Spouse/significant other Available Help at Discharge:  (79yo spouse present 24/7. Adult children intermittently) Type of Home: House Home Access: Stairs to enter [employer] of Steps: 1 Entrance Stairs-Rails: Left Home Layout: One level Home Equipment: Cane - single point;Shower seat;Grab bars - toilet;Hand held shower head Prior Function Level of Independence: Independent with assistive device(s);Needs assistance (needs assist for bathing only) ADL's / Homemaking Assistance Needed: assist w/showers only Communication Communication: No difficulties         Vision/Perception Vision - History Baseline Vision: Wears glasses all the time   Cognition  Cognition Arousal/Alertness: Awake/alert Behavior During Therapy: WFL for tasks assessed/performed Overall Cognitive Status: Within Functional Limits for tasks assessed    Extremity/Trunk Assessment Upper Extremity Assessment Upper Extremity Assessment: Overall WFL for tasks assessed     Mobility Bed Mobility Bed Mobility: Supine to Sit Supine to Sit: 6: Modified independent (Device/Increase time) Details for Bed Mobility Assistance: bed flat, no rails, inc time Transfers Transfers: Sit to Stand;Stand to Sit Sit to  Stand: 5:  Supervision Stand to Sit: 5: Supervision     Exercise     Balance     End of Session OT - End of Session Equipment Utilized During Treatment: Other (comment) [facility]) Activity Tolerance: Patient tolerated treatment well Patient left: in chair;with family/visitor present  GO     Deijah Spikes, Deidre Ala 04/28/2013, 3:50 PM

## 2013-04-28 NOTE — Progress Notes (Signed)
UR COMPLETED  

## 2013-04-28 NOTE — Progress Notes (Signed)
Pt discharged to home. Home health PT/OT set up by Steward Drone, CM. Pt is to make follow up appointment with PCP. Pt and family are aware of decrease in percocet and trazadone. Rolling walker delivered to room. Stable for DC. Reviewed when to call the doctor and when to call 911. Duwaine Maxin, RN

## 2013-04-28 NOTE — Evaluation (Signed)
Physical Therapy Evaluation Patient Details Name: [name] MRN: [id number] DOB: 11/02/1918 Today's Date: 04/28/2013 Time: 1519-1600 PT Time Calculation (min): 41 min  PT Assessment / Plan / Recommendation History of Present Illness  79yo admitted 2/2 confusion, likely unintentional med overdose  Clinical Impression  Pt functioning near baseline however with freq falls at home and with poor support during the day. Pt unable to care for self as she has difficulty managing meds in addition to h/o freq falls. Pt requires 24/7 supervision and use of RW for safe discharge home. Spoke extensively with family regarding d/c recommendations. Daughter agrees pt and spouse need assist at home during the day and desires a home health aide.     PT Assessment  Patient needs continued PT services    Follow Up Recommendations  Home health PT;Supervision/Assistance - 24 hour    Does the patient have the potential to tolerate intense rehabilitation      Barriers to Discharge Decreased caregiver support pt with freq falls and lives with spouse who is 40 yrs old and currently has a broken arm from recent fall    Equipment Recommendations  Rolling walker with 5" wheels    Recommendations for Other Services     Frequency Min 3X/week    Precautions / Restrictions Precautions Precautions: Fall Precaution Comments: pt w/recent hx of falls at home (4-5 past 3 months) Restrictions Weight Bearing Restrictions: No   Pertinent Vitals/Pain Denies pain      Mobility  Bed Mobility Bed Mobility: Not assessed Supine to Sit: 6: Modified independent (Device/Increase time) Details for Bed Mobility Assistance: bed flat, no rails, inc time Transfers Transfers: Sit to Stand;Stand to Sit Sit to Stand: 4: Min guard;With upper extremity assist;From chair/3-in-1 Stand to Sit: 4: Min guard;With upper extremity assist;To chair/3-in-1 Details for Transfer Assistance: pt unable to control descent into  chair Ambulation/Gait Ambulation/Gait Assistance: 4: Min guard Ambulation Distance (Feet): 150 Feet Assistive device: Rolling walker Ambulation/Gait Assistance Details: began ambulation with SPC however pt with unsafe use as pt mostly just carried it in her hand and infrequently places cane on ground. Pt with improved gait pattern with RW however required directional v/c's. Pt with increased stability with RW. Gait Pattern: Step-through pattern;Decreased stride length;Shuffle Gait velocity: slow Stairs: No    Exercises     PT Diagnosis: Generalized weakness;Difficulty walking  PT Problem List: Decreased strength;Decreased activity tolerance;Decreased balance;Decreased mobility PT Treatment Interventions: DME instruction;Gait training;Functional mobility training;Therapeutic activities;Therapeutic exercise     PT Goals(Current goals can be found in the care plan section) Acute Rehab PT Goals Patient Stated Goal: Go home with husband PT Goal Formulation: With patient Time For Goal Achievement: 05/05/13 Potential to Achieve Goals: Good  Visit Information  Last PT Received On: 04/28/13 Assistance Needed: +1 History of Present Illness: 79yo admitted 2/2 confusion, likely unintentional med overdose       Prior Functioning  Home Living Family/patient expects to be discharged to:: Private residence Living Arrangements: Spouse/significant other (who is 16 but has a broken arm) Available Help at Discharge: Family;Available PRN/intermittently Type of Home: House Home Access: Stairs to enter [employer] of Steps: 1 Entrance Stairs-Rails: Left Home Layout: One level Home Equipment: Cane - single point;Shower seat;Grab bars - toilet;Hand held shower head Prior Function Level of Independence: Independent with assistive device(s);Needs assistance Gait / Transfers Assistance Needed: pt uses cane ADL's / Homemaking Assistance Needed: assist with  showers Communication Communication: No difficulties Dominant Hand: Right    Cognition  Cognition Arousal/Alertness:  Awake/alert Behavior During Therapy: WFL for tasks assessed/performed Overall Cognitive Status: Impaired/Different from baseline Area of Impairment: Safety/judgement;Awareness Safety/Judgement: Decreased awareness of safety;Decreased awareness of deficits Awareness: Emergent General Comments: pt with history of being unable to manage pills. Pt will poor caryover of proper sequencing of cane. Pt decreased insight to deficts, feels that it is okay that she has fallen 4 times in the last 3 months    Extremity/Trunk Assessment Upper Extremity Assessment Upper Extremity Assessment: Overall WFL for tasks assessed Lower Extremity Assessment Lower Extremity Assessment: Generalized weakness Cervical / Trunk Assessment Cervical / Trunk Assessment: Normal   Balance    End of Session PT - End of Session Equipment Utilized During Treatment: Gait belt Activity Tolerance: Patient tolerated treatment well Patient left: in chair;with call bell/phone within reach;with family/visitor present Nurse Communication: Mobility status  GP Functional Assessment Tool Used: clinical judgement Functional Limitation: Mobility: Walking and moving around Mobility: Walking and Moving Around Current Status [phone]): At least 20 percent but less than 40 percent impaired, limited or restricted Mobility: Walking and Moving Around Goal Status [phone]): At least 1 percent but less than 20 percent impaired, limited or restricted   Marcene Brawn 04/28/2013, 4:12 PM .Lewis Shock, PT, DPT Pager #: [phone] Office #: [phone]

## 2013-04-28 NOTE — Care Management Note (Signed)
    Page 1 of 2   04/28/2013     4:16:54 PM   CARE MANAGEMENT NOTE 04/28/2013  Patient:  ARCADIA, GORGAS   Account Number:  [id number]  Date Initiated:  04/28/2013  Documentation initiated by:  GRAVES-BIGELOW,Leib Elahi  Subjective/Objective Assessment:   Pt admitted with confusion after taking meds. Pt is from home and wants to return home. Family is agreeable to [facility] services.     Action/Plan:   CM did make referral with [facility] and SOC to begin within 24-48 hours post d/c.   Anticipated DC Date:  04/28/2013   Anticipated DC Plan:  HOME W HOME HEALTH SERVICES      DC Planning Services  CM consult      [facility] Choice  HOME HEALTH  DURABLE MEDICAL EQUIPMENT   Choice offered to / List presented to:  C-4 Adult Children   DME arranged  WALKER - Lavone Nian      DME agency  [facility]     HH arranged  HH-2 PT  HH-3 OT      [facility] agency  [facility]   Status of service:  Completed, signed off Medicare Important Message given?   (If response is "NO", the following Medicare IM given date fields will be blank) Date Medicare IM given:   Date Additional Medicare IM given:    Discharge Disposition:  HOME W HOME HEALTH SERVICES  Per UR Regulation:  Reviewed for med. necessity/level of care/duration of stay  If discussed at Long Length of Stay Meetings, dates discussed:    Comments:

## 2013-04-28 NOTE — Discharge Summary (Signed)
Physician Discharge Summary  [name] ZOX:[id number] DOB: 22-Apr-1919 DOA: 04/27/2013  PCP: Ailene Ravel, MD  Admit date: 04/27/2013 Discharge date: 04/28/2013  Time spent: Less than 30 minutes  Recommendations for Outpatient Follow-up:  1. Dr. Burnell Blanks, PCP in 3 days 2. Home health PT and OT  3. Follow TSH and titrate blood pressure medication as appropriate.   Discharge Diagnoses:  Principal Problem:   Confusion Active Problems:   HYPOTHYROIDISM   HYPERTENSION   H/O: CVA (cerebrovascular accident)   Discharge Condition: Improved & Stable  Diet recommendation: Low sodium/ Heart healthy   Filed Weights   04/28/13 1124  Weight: 64.411 kg (142 lb)    History of present illness/hospital course:  [name] is a 79 y.o. female with PMH of HTN, high cholesterol, hypothyroidism, chronic CHF, osteoarthritis and h/o CVA, s/p PPM, unsteady gait &multiple falls who presented to the ED with family for evaluation of confusion. Per family several pills were missing from her mothers med/dispenser including narcotics. Patient's granddaughter organizes her weekly pills in a pill box. It is uncertain which medications were taken. At time of admission pt was alert but disoriented to time.  Pt presented with confusion of unclear etiology;  Mostly likely related to polypharmacy. At time of admission pt with no signs and symptoms of infection. CT head negative for acute finding. During hospital stay opioids and sedative medications held. Physical therapy and occupational therapy recommend home health PT/OT with the use Rolling walker with 5" wheels. Daughter reported multiple falls at home and inability of family to assist 24/7. We recommended temporary SNF, but patient with capacity refuses and would like to go home. Patient was continued on amlodipine, atenolol and lisinopril for hypertension, and levothyroxine for hypothyroid. TSH was low at 0.069 . At time of discharge Oxycodone  decreased to 0.5 tablets by mouth every 8 (eight) hours as needed for pain and trazodone decreased to 50 mg total by mouth at bedtime as needed for sleep.  Consultations:  None  Procedures:  None    Discharge Exam:   Complaints: Pt is adamant about going home today and refuses going to a SNF. Although the patient reports wanting to be discharged home, the daughter states that there is no 24/7 care at home and Pt. Husband is 5 years old with a broken shoulder. Daughter also reports pt has had multiple falls and has a shuffled gate. Daughter states they will have give the patient her medications on a daily basis versus weekly medication dispenser to avoid polypharmacy. Pt oriented x 4 and denies weakness, chest pain or shortness of breathe. Per family, patient's mental status is improved but not yet at baseline.   Filed Vitals:   04/28/13 0608 04/28/13 0731 04/28/13 1124 04/28/13 1400  BP: 183/70 154/62 168/76 138/64  Pulse: 81  88 72  Temp: 99.2 F (37.3 C)   98.7 F (37.1 C)  TempSrc:    Oral  Resp: 18   18  Weight:   64.411 kg (142 lb)   SpO2: 94%  96% 97%    General exam:  Elderly pleasant women  laying in bed in no acute distess  Respiratory system: Regular unlabored breathing. Lungs Clear to auscultation with no wheeze or rhonchi  Cardiovascular system: S1 & S2 heard, RRR. No JVD, murmurs, gallops, clicks or pedal edema.  Gastrointestinal system: Abdomen is nondistended, soft and nontender. Normal bowel sounds heard.  Central nervous system: Alert and oriented. No focal neurological deficits.  Extremities: Symmetric  5 x 5 power.  Discharge Instructions      Discharge Orders   Future Orders Complete By Expires   Call MD for:  severe uncontrolled pain  As directed    Call MD for:  As directed    Comments:     Confusion.   Diet - low sodium heart healthy  As directed    Increase activity slowly  As directed        Medication List         amLODipine 5 MG  tablet  Commonly known as:  NORVASC  Take 5 mg by mouth daily.     atenolol 50 MG tablet  Commonly known as:  TENORMIN  Take 25 mg by mouth daily as needed (if blood pressure is abover 160/100).     citalopram 20 MG tablet  Commonly known as:  CELEXA  Take 20 mg by mouth daily.     dipyridamole-aspirin 200-25 MG per 12 hr capsule  Commonly known as:  AGGRENOX  Take 1 capsule by mouth 2 (two) times daily.     fish oil-omega-3 fatty acids 1000 MG capsule  Take 1 g by mouth daily.     furosemide 20 MG tablet  Commonly known as:  LASIX  Take 20 mg by mouth as needed for fluid (take with potassium).     gabapentin 100 MG capsule  Commonly known as:  NEURONTIN  Take 100 mg by mouth 2 (two) times daily.     levothyroxine 100 MCG tablet  Commonly known as:  SYNTHROID, LEVOTHROID  Take 100 mcg by mouth daily before breakfast.     lisinopril 40 MG tablet  Commonly known as:  PRINIVIL,ZESTRIL  Take 40 mg by mouth daily.     multivitamin tablet  Take 1 tablet by mouth daily.     oxyCODONE-acetaminophen 10-325 MG per tablet  Commonly known as:  PERCOCET  Take 0.5 tablets by mouth every 8 (eight) hours as needed for pain.     potassium chloride 10 MEQ tablet  Commonly known as:  K-DUR,KLOR-CON  Take 10 mEq by mouth daily as needed (take with furosemide).     pravastatin 40 MG tablet  Commonly known as:  PRAVACHOL  Take 40 mg by mouth at bedtime.     solifenacin 5 MG tablet  Commonly known as:  VESICARE  Take 5 mg by mouth daily.     traZODone 100 MG tablet  Commonly known as:  DESYREL  Take 0.5 tablets (50 mg total) by mouth at bedtime as needed for sleep.     vitamin B-12 1000 MCG tablet  Commonly known as:  CYANOCOBALAMIN  Take 1,000 mcg by mouth daily.       Follow-up Information   Follow up with [facility], MD. Schedule an appointment as soon as possible for a visit in 3 days.   Specialty:  Family Medicine   Contact information:   Dr. Burnell Blanks [address] [place] [address] [phone]        The results of significant diagnostics from this hospitalization (including imaging, microbiology, ancillary and laboratory) are listed below for reference.    Significant Diagnostic Studies: Ct Angio Head W/cm &/or Wo Cm  04/27/2013   CLINICAL DATA:  Altered mental status and confusion. Dementia. Cardiac pacemaker. Hypertension and hypercholesterolemia.  EXAM: CT ANGIOGRAPHY HEAD  TECHNIQUE: Multidetector CT imaging of the head was performed using the standard protocol during bolus administration of intravenous contrast. Multiplanar CT image reconstructions including  MIPs were obtained to evaluate the vascular anatomy.  CONTRAST:  50mL OMNIPAQUE IOHEXOL 350 MG/ML SOLN  COMPARISON:  CT head 04/27/2013.  MRI/MRA head 07/29/2006.  FINDINGS: Right internal carotid artery: Mild non stenotic atheromatous change of the high cervical, petrous, and cavernous segments. 50% calcific stenosis supraclinoid segment. ICA Terminus widely patent.  Left internal carotid artery: Mild non stenotic atheromatous change of the cavernous segment. 50% stenosis of the supraclinoid segment. ICA terminus widely patent.  Right vertebral: Dominant vessel. Mild non stenotic atheromatous change.  Left vertebral: Heavily calcified plaque between the left PICA origin and basilar confluence estimated 50-75% stenosis.  Basilar: High-grade mid basilar stenosis between the anterior inferior and superior cerebellar arteries estimated 75-90% (image 47 series 16109).  No proximal stenosis of the anterior, middle, or posterior cerebral arteries. No intracranial berry aneurysm. No cerebellar branch occlusion.  Moderate cerebral and cerebellar atrophy. Chronic microvascular ischemic change affects the periventricular and subcortical white matter. Post infusion, there is no abnormal enhancement of the brain or meninges.  Calvarium is intact.  There is no acute sinus or mastoid disease.   Review of the MIP images confirms the above findings.  IMPRESSION: High-grade mid basilar stenosis estimated 75-90%. This appears to have slightly progressed since prior MRA from 2008.  50-75% non dominant left vertebral stenosis distally.   Electronically Signed   By: Davonna Belling M.D.   On: 04/27/2013 20:18   Ct Head Wo Contrast  04/27/2013   CLINICAL DATA:  Altered mental status.  EXAM: CT HEAD WITHOUT CONTRAST  TECHNIQUE: Contiguous axial images were obtained from the base of the skull through the vertex without intravenous contrast.  COMPARISON:  01/01/2013.  FINDINGS: No intracranial hemorrhage.  No CT evidence of large acute infarct.  Small vessel disease type changes.  No intracranial mass lesion noted on this unenhanced exam.  No hydrocephalus.  Vascular calcifications.  IMPRESSION: No acute abnormality.   Electronically Signed   By: Bridgett Larsson M.D.   On: 04/27/2013 10:07   Dg Chest Port 1 View  04/27/2013   CLINICAL DATA:  Altered mental status.  EXAM: PORTABLE CHEST - 1 VIEW  COMPARISON:  01/01/2013 right shoulder films and 01/06/2007 chest x-ray.  FINDINGS: Sequential pacemaker enters from the left with leads appearing to be in the region of the right atrium and right ventricle. Cardiomegaly.  Central pulmonary vascular prominence.  No segmental consolidation or gross pneumothorax.  Calcified aorta.  Prominent bilateral shoulder joint degenerative changes.  IMPRESSION: Cardiomegaly with pacemaker in place.  Central pulmonary vascular prominence without pulmonary edema.  No segmental consolidation.  Calcified mildly tortuous aorta.   Electronically Signed   By: Bridgett Larsson M.D.   On: 04/27/2013 09:29    Microbiology: No results found for this or any previous visit (from the past 240 hour(s)).   Labs: Basic Metabolic Panel:  Recent Labs Lab 04/27/13 0920 04/27/13 1640  NA 138  --   K 3.9  --   CL 103  --   CO2 26  --   GLUCOSE 103*  --   BUN 21  --   CREATININE 0.62 0.59   CALCIUM 8.7  --    Liver Function Tests:  Recent Labs Lab 04/27/13 0920  AST 16  ALT 9  ALKPHOS 51  BILITOT 0.8  PROT 6.2  ALBUMIN 3.6   No results found for this basename: LIPASE, AMYLASE,  in the last 168 hours No results found for this basename: AMMONIA,  in the last 168  hours CBC:  Recent Labs Lab 04/27/13 0920 04/27/13 1640  WBC 5.6 5.6  NEUTROABS 3.3  --   HGB 12.0 12.2  HCT 36.0 36.1  MCV 99.4 98.6  PLT 224 236   Cardiac Enzymes:  Recent Labs Lab 04/27/13 0920  TROPONINI <0.30   BNP: BNP (last 3 results) No results found for this basename: PROBNP,  in the last 8760 hours CBG: No results found for this basename: GLUCAP,  in the last 168 hours  Additional labs: 1. TSH- low at 0.069 2. salicylate level <2 3. acetaminophen level <15     Signed:  Marcellus Scott, MD, FACP, FHM. [facility] Pager [phone]  If 7PM-7AM, please contact night-coverage [email or web address] Password [facility] 04/28/2013, 4:21 PM

## 2013-09-30 ENCOUNTER — Encounter: Payer: Self-pay | Admitting: *Deleted

## 2013-11-11 ENCOUNTER — Encounter: Payer: Medicare Other | Admitting: Cardiology

## 2013-11-18 ENCOUNTER — Encounter: Payer: Self-pay | Admitting: Cardiology

## 2013-11-18 ENCOUNTER — Ambulatory Visit (INDEPENDENT_AMBULATORY_CARE_PROVIDER_SITE_OTHER): Payer: Medicare Other | Admitting: Cardiology

## 2013-11-18 VITALS — BP 128/72 | HR 68 | Ht 63.0 in | Wt 146.0 lb

## 2013-11-18 DIAGNOSIS — I495 Sick sinus syndrome: Secondary | ICD-10-CM

## 2013-11-18 DIAGNOSIS — Z45018 Encounter for adjustment and management of other part of cardiac pacemaker: Secondary | ICD-10-CM

## 2013-11-18 DIAGNOSIS — Z950 Presence of cardiac pacemaker: Secondary | ICD-10-CM

## 2013-11-18 LAB — MDC_IDC_ENUM_SESS_TYPE_INCLINIC
Battery Impedance: 1547 Ohm
Battery Voltage: 2.74 V
Brady Statistic AP VP Percent: 0 %
Date Time Interrogation Session: 20150604160532
Lead Channel Impedance Value: 565 Ohm
Lead Channel Pacing Threshold Pulse Width: 0.4 ms
Lead Channel Pacing Threshold Pulse Width: 0.4 ms
Lead Channel Sensing Intrinsic Amplitude: 2.8 mV
Lead Channel Sensing Intrinsic Amplitude: 22.4 mV
Lead Channel Setting Pacing Amplitude: 2 V
Lead Channel Setting Pacing Amplitude: 2.5 V
Lead Channel Setting Sensing Sensitivity: 5.6 mV
MDC IDC MSMT BATTERY REMAINING LONGEVITY: 33 mo
MDC IDC MSMT LEADCHNL RA PACING THRESHOLD AMPLITUDE: 0.5 V
MDC IDC MSMT LEADCHNL RV IMPEDANCE VALUE: 619 Ohm
MDC IDC MSMT LEADCHNL RV PACING THRESHOLD AMPLITUDE: 0.75 V
MDC IDC SET LEADCHNL RV PACING PULSEWIDTH: 0.4 ms
MDC IDC STAT BRADY AP VS PERCENT: 65 %
MDC IDC STAT BRADY AS VP PERCENT: 0 %
MDC IDC STAT BRADY AS VS PERCENT: 34 %

## 2013-11-18 NOTE — Patient Instructions (Signed)
Your physician recommends that you continue on your current medications as directed. Please refer to the Current Medication list given to you today.  You  have a device clinic appt scheduled for 03/23/14 @3pm 

## 2013-11-18 NOTE — Progress Notes (Signed)
ELECTROPHYSIOLOGY OFFICE NOTE   Patient ID: [name] MRN: [id number], DOB/AGE: 78/07/1918   Date of Visit: 11/18/2013  Primary Physician: Burnell BlanksMaura Hamrick, MD Primary Cardiologist: Lewayne BuntingGregg Taylor, MD Reason for Visit: EP/device follow-up  History of Present Illness  Dana PilonKathleen W Keller is a 80 y.o. female with sinus node dysfunction s/p PPM implant who presents today for routine electrophysiology followup. She is accompanied by her son. She was last seen by Dr. Ladona Ridgelaylor in Sept 2013. Since last being seen in our clinic, she reports she is doing well and has no complaints. She remains active despite her age and reports intermittent fatigue after "overdoing it" but this is not new for her. This is no worse than usual and she is able to perform ADLs without limitation. Sadly, she lost her husband of 72 years suddenly in February and her family has been staying with her in the home. She denies chest pain or shortness of breath. She denies palpitations, dizziness, near syncope or syncope. She denies LE swelling or orthopnea.   Past Medical History Past Medical History  Diagnosis Date  . Cardiac pacemaker in situ   . Dizziness and giddiness   . Other specified cardiac dysrhythmias(427.89)     bradycardia  . Iron deficiency anemia, unspecified   . Unspecified hypothyroidism   . Unspecified essential hypertension   . Pure hypercholesterolemia   . Congestive heart failure, unspecified   . Complication of anesthesia     ANXIETY  . Pacemaker   . Osteoarthrosis, unspecified whether generalized or localized, unspecified site     OA  . Confusion 04/27/2013  . TIA (transient ischemic attack)   . Overactive bladder     Past Surgical History Past Surgical History  Procedure Laterality Date  . Pacemaker insertion  01/05/07    Medtronic, dual chamber. Dr. Lewayne BuntingGregg Taylor MD.  . Bilateral total hip arthoplasties    . Insert / replace / remove pacemaker    . Total knee arthroplasty Bilateral   .  Cataract extraction, bilateral    . Abdominal hysterectomy      Allergies/Intolerances No Known Allergies  Current Home Medications Current Outpatient Prescriptions  Medication Sig Dispense Refill  . amLODipine (NORVASC) 5 MG tablet Take 5 mg by mouth daily.      Marland Kitchen. atenolol (TENORMIN) 50 MG tablet Take 25 mg by mouth daily as needed (if blood pressure is abover 160/100).      . citalopram (CELEXA) 20 MG tablet Take 20 mg by mouth daily.        Marland Kitchen. dipyridamole-aspirin (AGGRENOX) 25-200 MG per 12 hr capsule Take 1 capsule by mouth 2 (two) times daily.        . fish oil-omega-3 fatty acids 1000 MG capsule Take 1 g by mouth daily.       . furosemide (LASIX) 20 MG tablet Take 20 mg by mouth as needed for fluid (take with potassium).       Marland Kitchen. gabapentin (NEURONTIN) 100 MG capsule Take 100 mg by mouth 2 (two) times daily.       Marland Kitchen. levothyroxine (SYNTHROID, LEVOTHROID) 100 MCG tablet Take 100 mcg by mouth daily before breakfast.      . lisinopril (PRINIVIL,ZESTRIL) 40 MG tablet Take 40 mg by mouth daily.      . Multiple Vitamin (MULTIVITAMIN) tablet Take 1 tablet by mouth daily.        Marland Kitchen. oxyCODONE-acetaminophen (PERCOCET) 10-325 MG per tablet Take 0.5 tablets by mouth every 8 (eight) hours as needed for  pain.      . potassium chloride (K-DUR,KLOR-CON) 10 MEQ tablet Take 10 mEq by mouth daily as needed (take with furosemide).      . pravastatin (PRAVACHOL) 40 MG tablet Take 40 mg by mouth at bedtime.        . solifenacin (VESICARE) 5 MG tablet Take 5 mg by mouth daily.      . traZODone (DESYREL) 100 MG tablet Take 0.5 tablets (50 mg total) by mouth at bedtime as needed for sleep.      . vitamin B-12 (CYANOCOBALAMIN) 1000 MCG tablet Take 1,000 mcg by mouth daily.       No current facility-administered medications for this visit.    Social History History   Social History  . Marital Status: Married    Spouse Name: N/A    Number of Children: N/A  . Years of Education: N/A   Occupational  History  . Not on file.   Social History Main Topics  . Smoking status: Never Smoker   . Smokeless tobacco: Never Used  . Alcohol Use: No  . Drug Use: No  . Sexual Activity: Not on file   Other Topics Concern  . Not on file   Social History Narrative   Lives with her husband.      Review of Systems General: No chills, fever, night sweats or weight changes Cardiovascular: No chest pain, dyspnea on exertion, edema, orthopnea, palpitations, paroxysmal nocturnal dyspnea Dermatological: No rash, lesions or masses Respiratory: No cough, dyspnea Urologic: No hematuria, dysuria Abdominal: No nausea, vomiting, diarrhea, bright red blood per rectum, melena, or hematemesis Neurologic: No visual changes, weakness, changes in mental status All other systems reviewed and are otherwise negative except as noted above.  Physical Exam Vitals: Blood pressure 128/72, pulse 68, height 5\' 3"  (1.6 m), weight 146 lb (66.225 kg).  General: Well developed, well appearing 80 y.o. female in no acute distress. HEENT: Normocephalic, atraumatic. EOMs intact. Sclera nonicteric. Oropharynx clear.  Neck: Supple. No JVD. Lungs: Respirations regular and unlabored, CTA bilaterally. No wheezes, rales or rhonchi. Heart: RRR. S1, S2 present. No murmurs, rub, S3 or S4. Abdomen: Soft, non-distended.  Extremities: No clubbing, cyanosis or edema. PT/Radials 2+ and equal bilaterally. Psych: Normal affect. Neuro: Alert and oriented X 3. Moves all extremities spontaneously.   Diagnostics Device interrogation today - Normal device function. Thresholds, sensing, impedances consistent with previous measurements. Device programmed to maximize longevity. 4 mode switch episodes, all <30 seconds, no EGMs. No high ventricular rates noted. Device programmed at appropriate safety margins. Histogram distribution appropriate for patient activity level. Device programmed to optimize intrinsic conduction. Estimated longevity 2.5 years.     Assessment and Plan Sinus node dysfunction s/p PPM implant - normal device function - no programming changes made - return to device clinic for pacemaker check in 6 months - return for follow-up with Dr. [name] in one year  Signed, Minda Meo, PA-C 11/18/2013, 3:51 PM

## 2013-12-21 ENCOUNTER — Encounter: Payer: Self-pay | Admitting: Internal Medicine

## 2014-01-19 ENCOUNTER — Other Ambulatory Visit: Payer: Self-pay | Admitting: Family Medicine

## 2014-01-19 DIAGNOSIS — Z1231 Encounter for screening mammogram for malignant neoplasm of breast: Secondary | ICD-10-CM

## 2014-01-27 ENCOUNTER — Ambulatory Visit: Payer: Medicare Other

## 2014-03-25 ENCOUNTER — Encounter: Payer: Self-pay | Admitting: Internal Medicine

## 2014-04-22 ENCOUNTER — Encounter: Payer: Self-pay | Admitting: Internal Medicine

## 2014-05-02 ENCOUNTER — Ambulatory Visit (INDEPENDENT_AMBULATORY_CARE_PROVIDER_SITE_OTHER): Payer: Medicare Other | Admitting: *Deleted

## 2014-05-02 DIAGNOSIS — R001 Bradycardia, unspecified: Secondary | ICD-10-CM

## 2014-05-02 LAB — MDC_IDC_ENUM_SESS_TYPE_INCLINIC
Battery Impedance: 1778 Ohm
Brady Statistic AP VS Percent: 75 %
Brady Statistic AS VS Percent: 24 %
Date Time Interrogation Session: 20151116152753
Lead Channel Impedance Value: 549 Ohm
Lead Channel Impedance Value: 656 Ohm
Lead Channel Pacing Threshold Amplitude: 0.5 V
Lead Channel Pacing Threshold Amplitude: 0.75 V
Lead Channel Pacing Threshold Pulse Width: 0.4 ms
Lead Channel Pacing Threshold Pulse Width: 0.4 ms
Lead Channel Sensing Intrinsic Amplitude: 15.67 mV
Lead Channel Setting Pacing Amplitude: 2 V
Lead Channel Setting Sensing Sensitivity: 5.6 mV
MDC IDC MSMT BATTERY REMAINING LONGEVITY: 29 mo
MDC IDC MSMT BATTERY VOLTAGE: 2.73 V
MDC IDC MSMT LEADCHNL RA SENSING INTR AMPL: 2 mV
MDC IDC SET LEADCHNL RV PACING AMPLITUDE: 2.5 V
MDC IDC SET LEADCHNL RV PACING PULSEWIDTH: 0.4 ms
MDC IDC STAT BRADY AP VP PERCENT: 1 %
MDC IDC STAT BRADY AS VP PERCENT: 0 %

## 2014-05-02 NOTE — Progress Notes (Signed)
Pacemaker check in clinic. Normal device function. Thresholds, sensing, impedances consistent with previous measurements. Device programmed to maximize longevity. 3 mode switches all < 1 minute.  No high ventricular rates noted. Device programmed at appropriate safety margins. Histogram distribution appropriate for patient activity level. Device programmed to optimize intrinsic conduction. Estimated longevity 2.5 years.  Patient education completed.  ROV 6 months with Dr. Ladona Ridgelaylor.

## 2014-05-11 ENCOUNTER — Encounter: Payer: Self-pay | Admitting: Internal Medicine

## 2014-06-01 ENCOUNTER — Encounter (HOSPITAL_COMMUNITY): Payer: Self-pay | Admitting: Emergency Medicine

## 2014-06-01 ENCOUNTER — Emergency Department (HOSPITAL_COMMUNITY): Payer: Medicare Other

## 2014-06-01 ENCOUNTER — Emergency Department (HOSPITAL_COMMUNITY)
Admission: EM | Admit: 2014-06-01 | Discharge: 2014-06-01 | Disposition: A | Discharge status: Home / Self Care | Payer: Medicare Other | Attending: Emergency Medicine | Admitting: Emergency Medicine

## 2014-06-01 DIAGNOSIS — R079 Chest pain, unspecified: Secondary | ICD-10-CM | POA: Insufficient documentation

## 2014-06-01 DIAGNOSIS — Z950 Presence of cardiac pacemaker: Secondary | ICD-10-CM | POA: Insufficient documentation

## 2014-06-01 DIAGNOSIS — R0789 Other chest pain: Secondary | ICD-10-CM | POA: Diagnosis present

## 2014-06-01 DIAGNOSIS — E039 Hypothyroidism, unspecified: Secondary | ICD-10-CM | POA: Diagnosis not present

## 2014-06-01 DIAGNOSIS — Z8673 Personal history of transient ischemic attack (TIA), and cerebral infarction without residual deficits: Secondary | ICD-10-CM | POA: Insufficient documentation

## 2014-06-01 DIAGNOSIS — F0390 Unspecified dementia without behavioral disturbance: Secondary | ICD-10-CM | POA: Diagnosis present

## 2014-06-01 DIAGNOSIS — I509 Heart failure, unspecified: Secondary | ICD-10-CM | POA: Diagnosis not present

## 2014-06-01 DIAGNOSIS — Z79899 Other long term (current) drug therapy: Secondary | ICD-10-CM | POA: Diagnosis not present

## 2014-06-01 DIAGNOSIS — I10 Essential (primary) hypertension: Secondary | ICD-10-CM | POA: Diagnosis not present

## 2014-06-01 DIAGNOSIS — E780 Pure hypercholesterolemia: Secondary | ICD-10-CM | POA: Insufficient documentation

## 2014-06-01 DIAGNOSIS — R0602 Shortness of breath: Secondary | ICD-10-CM | POA: Diagnosis not present

## 2014-06-01 DIAGNOSIS — Z7982 Long term (current) use of aspirin: Secondary | ICD-10-CM | POA: Diagnosis not present

## 2014-06-01 DIAGNOSIS — D509 Iron deficiency anemia, unspecified: Secondary | ICD-10-CM | POA: Insufficient documentation

## 2014-06-01 DIAGNOSIS — I495 Sick sinus syndrome: Secondary | ICD-10-CM | POA: Diagnosis present

## 2014-06-01 HISTORY — DX: Hypothyroidism, unspecified: E03.9

## 2014-06-01 HISTORY — DX: Bradycardia, unspecified: R00.1

## 2014-06-01 HISTORY — DX: Essential (primary) hypertension: I10

## 2014-06-01 HISTORY — DX: Unspecified dementia, unspecified severity, without behavioral disturbance, psychotic disturbance, mood disturbance, and anxiety: F03.90

## 2014-06-01 HISTORY — DX: Repeated falls: R29.6

## 2014-06-01 LAB — CBC
HEMATOCRIT: 40.2 % (ref 36.0–46.0)
HEMOGLOBIN: 13.2 g/dL (ref 12.0–15.0)
MCH: 32.4 pg (ref 26.0–34.0)
MCHC: 32.8 g/dL (ref 30.0–36.0)
MCV: 98.5 fL (ref 78.0–100.0)
Platelets: 242 10*3/uL (ref 150–400)
RBC: 4.08 MIL/uL (ref 3.87–5.11)
RDW: 12.8 % (ref 11.5–15.5)
WBC: 4.7 10*3/uL (ref 4.0–10.5)

## 2014-06-01 LAB — PROTIME-INR
INR: 0.97 (ref 0.00–1.49)
Prothrombin Time: 13 seconds (ref 11.6–15.2)

## 2014-06-01 LAB — BASIC METABOLIC PANEL
Anion gap: 12 (ref 5–15)
BUN: 19 mg/dL (ref 6–23)
CHLORIDE: 102 meq/L (ref 96–112)
CO2: 28 mEq/L (ref 19–32)
CREATININE: 0.67 mg/dL (ref 0.50–1.10)
Calcium: 9.5 mg/dL (ref 8.4–10.5)
GFR calc non Af Amer: 73 mL/min — ABNORMAL LOW (ref >90)
GFR, EST AFRICAN AMERICAN: 85 mL/min — AB (ref >90)
GLUCOSE: 101 mg/dL — AB (ref 70–99)
Potassium: 4.3 mEq/L (ref 3.7–5.3)
Sodium: 142 mEq/L (ref 137–147)

## 2014-06-01 LAB — PRO B NATRIURETIC PEPTIDE: PRO B NATRI PEPTIDE: 513.2 pg/mL — AB (ref 0–450)

## 2014-06-01 LAB — I-STAT TROPONIN, ED: Troponin i, poc: 0.01 ng/mL (ref 0.00–0.08)

## 2014-06-01 MED ORDER — AMLODIPINE BESYLATE 5 MG PO TABS
5.0000 mg | ORAL_TABLET | Freq: Every day | ORAL | Status: DC
  Administered 2014-06-01: 5 mg via ORAL
  Filled 2014-06-01: qty 1

## 2014-06-01 MED ORDER — NITROGLYCERIN 0.4 MG SL SUBL: 0.4000 mg | SUBLINGUAL_TABLET | SUBLINGUAL | Status: AC | PRN

## 2014-06-01 MED ORDER — LISINOPRIL 20 MG PO TABS
40.0000 mg | ORAL_TABLET | Freq: Every day | ORAL | Status: DC
  Administered 2014-06-01: 40 mg via ORAL
  Filled 2014-06-01: qty 2

## 2014-06-01 NOTE — Consult Note (Signed)
History and Physical  Patient ID: [name] MRN: [id number], DOB: 08/28/1918 Date of Encounter: 06/01/2014, 1:34 PM Primary Physician: Ailene RavelHAMRICK,MAURA L, MD Primary Cardiologist: Nahser    Chief Complaint: chest pain and SOB Reason for Admission: chest pain and SOB in the setting of elevated BP, negative troponin  HPI: [name] is a 80 y/o F with history of mild-moderate dementia, HTN, HLD, remote TIA/CVA, symptomatic bradycardia s/p MDT pacemaker 2008, ? cerebral stent, chronic diastolic CHF (although no significant admissions) who presents to the hospital with CP and SOB. She has no known hx of CAD.  She lives alone but has an aide that stays with her and multiple children who look after her including med management. Unfortunately the patient's memory is poor and she does not recall why she's here. Her daughter reports this as baseline. Apparently she told her son this morning that she "needed some help" and indicated that she was having chest discomfort and SOB. She is unable to provide any other details about the quality, location, or intensity of discomfort. She is not very active at home and uses a cane for assistance. No associated cough, nausea, vomiting, palpitations, LEE, orthopnea, abdominal swelling, change in appetite, near-syncope, syncope or recent known high BPs at home. Her son gave her 324mg  ASA and called 911. She did not receive any NTG. On admission she was 206/85 - she was given amlodipine 5mg  and lisinopril 40mg  with improvement in BP to 130/60. Apparently she noted improvement in CP sometime after arrival, about 2 hours after discomfort began. She currently feels fine without complaints - no further chest pain, states, "I feel relaxed." Pacemaker interrogation showed normal function - no significant events; 1 mode switch <0.1% but no ventricular high rate episodes. She is not tachycardic, tachypnic or hypoxic. Labs show mild pBNP 513 but weight stable from 11/2013.  Otherwise CBC, BMET are unremarkable. CXR with mild diffuse bronchial wall thickening, atherosclerosis, eventration of the medial left hemidiaphragm (may suggest interval development of a Bochdalek's hernia).  Past Medical History  Diagnosis Date  . Cardiac pacemaker in situ   . Dizziness and giddiness   . Symptomatic bradycardia     a. Near syncope s/p Medtronic pacemaker 2008.  . Iron deficiency anemia, unspecified   . Hypothyroidism   . HTN (hypertension)   . Pure hypercholesterolemia   . Chronic diastolic CHF (congestive heart failure)   . Complication of anesthesia     ANXIETY  . Pacemaker   . Osteoarthrosis, unspecified whether generalized or localized, unspecified site     OA  . Confusion Adm - 04/27/2013  . TIA (transient ischemic attack)   . Overactive bladder   . History of CVA (cerebrovascular accident)   . Multiple falls      Most Recent Cardiac Studies: 2D echo 07/2006 SUMMARY - Left ventricular size was at the upper limits of normal. Overall    left ventricular systolic function was normal. Left    ventricular ejection fraction was estimated to be 60 %. There    was no diagnostic evidence of left ventricular regional wall    motion abnormalities. Left ventricular wall thickness was at    the upper limits of normal. - Aortic valve thickness was mildly to moderately increased. - There was mild fibrocalcific change of the aortic root. - There was mild to moderate mitral valvular regurgitation. - Left atrial size was at the upper limits of normal. - There was mild right ventricular hypertrophy. COMPARISONS - Compared to the  previous study of 03-Jul-2005 :no significant    change   Surgical History:  Past Surgical History  Procedure Laterality Date  . Pacemaker insertion  01/05/07    Medtronic, dual chamber. Dr. Lewayne Bunting MD.  . Bilateral total hip arthoplasties    . Insert / replace / remove pacemaker    . Total knee  arthroplasty Bilateral   . Cataract extraction, bilateral    . Abdominal hysterectomy       Home Meds: Prior to Admission medications   Medication Sig Start Date End Date Taking? Authorizing Provider  amLODipine (NORVASC) 5 MG tablet Take 5 mg by mouth daily.   Yes Historical Provider, MD  atenolol (TENORMIN) 50 MG tablet Take 25 mg by mouth daily as needed (if blood pressure is abover 160/100).   Yes Historical Provider, MD  CALCIUM PO Take 1 tablet by mouth daily.   Yes Historical Provider, MD  citalopram (CELEXA) 20 MG tablet Take 20 mg by mouth daily.     Yes Historical Provider, MD  dipyridamole-aspirin (AGGRENOX) 25-200 MG per 12 hr capsule Take 1 capsule by mouth 2 (two) times daily.     Yes Historical Provider, MD  fish oil-omega-3 fatty acids 1000 MG capsule Take 1 g by mouth daily.    Yes Historical Provider, MD  gabapentin (NEURONTIN) 100 MG capsule Take 100 mg by mouth 2 (two) times daily.    Yes Historical Provider, MD  levothyroxine (SYNTHROID, LEVOTHROID) 100 MCG tablet Take 100 mcg by mouth daily before breakfast.   Yes Historical Provider, MD  MELATONIN PO Take 1 tablet by mouth daily.   Yes Historical Provider, MD  Multiple Vitamin (MULTIVITAMIN) tablet Take 1 tablet by mouth daily.     Yes Historical Provider, MD  oxyCODONE-acetaminophen (PERCOCET) 10-325 MG per tablet Take 0.5 tablets by mouth every 8 (eight) hours as needed for pain. Patient taking differently: Take 0.5 tablets by mouth 2 (two) times daily.  04/28/13  Yes Elease Etienne, MD  pravastatin (PRAVACHOL) 40 MG tablet Take 40 mg by mouth at bedtime.     Yes Historical Provider, MD  vitamin B-12 (CYANOCOBALAMIN) 1000 MCG tablet Take 1,000 mcg by mouth daily.   Yes Historical Provider, MD  furosemide (LASIX) 20 MG tablet Take 20 mg by mouth as needed for fluid (take with potassium).     Historical Provider, MD  potassium chloride (K-DUR,KLOR-CON) 10 MEQ tablet Take 10 mEq by mouth daily as needed (take with  furosemide).    Historical Provider, MD  solifenacin (VESICARE) 5 MG tablet Take 5 mg by mouth daily.    Historical Provider, MD  traZODone (DESYREL) 100 MG tablet Take 0.5 tablets (50 mg total) by mouth at bedtime as needed for sleep. Patient not taking: Reported on 06/01/2014 04/28/13   Elease Etienne, MD    Allergies: No Known Allergies  History   Social History  . Marital Status: Married    Spouse Name: N/A    Number of Children: N/A  . Years of Education: N/A   Occupational History  . Not on file.   Social History Main Topics  . Smoking status: Never Smoker   . Smokeless tobacco: Never Used  . Alcohol Use: No  . Drug Use: No  . Sexual Activity: Not on file   Other Topics Concern  . Not on file   Social History Narrative   Lives with her husband.      Family History  Problem Relation Age of Onset  .  Coronary artery disease Other   . Stroke Other   . COPD Other     Review of Systems: see above. Not able to fully reliably obtain full ROS from patient due to dementia.  Labs:   Lab Results  Component Value Date   WBC 4.7 06/01/2014   HGB 13.2 06/01/2014   HCT 40.2 06/01/2014   MCV 98.5 06/01/2014   PLT 242 06/01/2014    Recent Labs Lab 06/01/14 0946  NA 142  K 4.3  CL 102  CO2 28  BUN 19  CREATININE 0.67  CALCIUM 9.5  GLUCOSE 101*   Radiology/Studies:  Dg Chest Port 1 View  06/01/2014   CLINICAL DATA:  80 year old female with history of shortness of breath for 1 day and chest pain which began at 8 a.m. this morning. Will radiation of chest pain, no associated nausea, vomiting or diaphoresis.  EXAM: PORTABLE CHEST - 1 VIEW  COMPARISON:  Chest x-ray 04/27/2013.  FINDINGS: Low lung volumes. Eventration of the medial left hemidiaphragm. No consolidative airspace disease. No pleural effusions. Mild diffuse bronchial wall thickening. Mild cardiomegaly. Upper mediastinal contours are within normal limits. Atherosclerosis in the thoracic aorta. Left-sided  pacemaker device in position with lead tips projecting over the expected location of the right atrium and right ventricular apex.  IMPRESSION: 1. Mild diffuse bronchial wall thickening. This may suggest an acute bronchitis. 2. Atherosclerosis. 3. New eventration of the medial left hemidiaphragm. This may suggest interval development of a Bochdalek's hernia.   Electronically Signed   By: Trudie Reed M.D.   On: 06/01/2014 10:17    EKG: NSR 68bpm, 1st degree AVB, left axis deviation, slight TWI in aVL but otherwise no acute ST-T changes  Physical Exam: Blood pressure 130/60, pulse 59, temperature 98.4 F (36.9 C), temperature source Oral, resp. rate 15, height 5\' 5"  (1.651 m), weight 145 lb (65.772 kg), SpO2 93 %. General: Well developed, well nourished, in no acute distress. Pleasantly demented. Head: Normocephalic, atraumatic, sclera non-icteric, no xanthomas, nares are without discharge.  Neck: Negative for carotid bruits. JVD not elevated. Lungs: Clear bilaterally to auscultation without wheezes, rales, or rhonchi. Breathing is unlabored. Heart: RRR with S1 S2. No murmurs, rubs, or gallops appreciated. Abdomen: Soft, non-tender, non-distended with normoactive bowel sounds. No hepatomegaly. No rebound/guarding. No obvious abdominal masses. Msk:  Strength and tone appear normal for age. Extremities: No clubbing or cyanosis. No edema.  Distal pedal pulses are 2+ and equal bilaterally. Neuro: Alert and oriented to self but unable to really give details about why she's here. Follows commands and moves all extremities spontaneously. Psych:  Attempts to respond to questions appropriately but memory limits helpful answers.   ASSESSMENT AND PLAN:    1. Transient chest discomfort  2. Transient worsening of HTN 3. Mild-moderate dementia 4. Sinus node dysfunction s/p pacemaker, functioning normally  It is unclear what precipitated her increased blood pressure this morning. Her family has been  managing her medications. She has an aide that monitors BP on a daily basis. Her chest pain may have been precipitated by transient accelerated HTN. Blood pressure is now well controlled in the ED after administration of oral medications. The patient is now asymptomatic. In light of her age and comorbidities to include dementia and history of stroke, would recommend to manage conservatively and observe for further symptoms as an outpatient. She is already on Aggrenox and statin, and uses PRN atenolol for high blood pressure. Given significant response in BP to the oral meds, would  continue home regimen for now and have her aide monitor BP daily. If BP begins running high, would consider titration of amlodipine or addition of scheduled BB/Imdur. Recommend to send her home with SL NTG and instructions to return if symptoms worsen or recur. Doubt clinical bronchitis based on lack of cough, fever, normal WBC and normal lung exam.   Signed, Dayna Dunn PA-C 06/01/2014, 1:34 PM   Attending Note:   The patient was seen and examined.  Agree with assessment and plan as noted above.  Changes made to the above note as needed.  Pt is a pleasantly demented 80 yo , presented today because she did not feel well.  Was found to have HTN - still eats a fair amount of extra salt.  Better now that her BP is better.   Labs are unremarkable  At this point, she is asymptomatic and is in NAD. I think that she can be safely discharged to home. Follow up with her primary medical doctor and Ronie SpiesDayna Dunn will also see her in the clinic at a [facility]   I've advised family to watch her salt.   Vesta MixerPhilip J. Nahser, Montez HagemanJr., MD, [facility] CenterFACC 06/01/2014, 2:29 PM [address],  [address] Office [phone] Pager [phone]- [phone]

## 2014-06-01 NOTE — Progress Notes (Signed)
F/u appt scheduled with me in clinic for 06[facility]13/14 [facility] [address] [facility] I have asked nursing to pass on to patient. Kristyn Obyrne PA-C

## 2014-06-01 NOTE — ED Provider Notes (Signed)
CSN: [id number]     Arrival date & time 06/01/14  4098 History   First MD Initiated Contact with Patient 06/01/14 [id number]     Chief Complaint  Patient presents with  . Chest Pain  . Shortness of Breath     (Consider location/radiation/quality/duration/timing/severity/associated sxs/prior Treatment) HPI Comments: Patient with past medical history of hypertension, CHF, HL, TIA, and pacemaker placement presents to the emergency department with chief complaint of chest pain and shortness breath. She is accompanied by family members, who stated the patient complained of chest pain and shortness of breath this morning. She now states that she is feeling better than she was. She states that her level of discomfort is minimal now. She has received aspirin from EMS. She states that she is followed by [facility] cardiology and had a pacemaker placed by Dr. [name].  She denies any n/v/d.  She denies any other symptoms.  Of note, patient found to be quite hypertensive, 206/85, but she has not taken her daily meds.  The history is provided by the patient. No language interpreter was used.    Past Medical History  Diagnosis Date  . Cardiac pacemaker in situ   . Dizziness and giddiness   . Other specified cardiac dysrhythmias(427.89)     bradycardia  . Iron deficiency anemia, unspecified   . Unspecified hypothyroidism   . Unspecified essential hypertension   . Pure hypercholesterolemia   . Congestive heart failure, unspecified   . Complication of anesthesia     ANXIETY  . Pacemaker   . Osteoarthrosis, unspecified whether generalized or localized, unspecified site     OA  . Confusion 04/27/2013  . TIA (transient ischemic attack)   . Overactive bladder    Past Surgical History  Procedure Laterality Date  . Pacemaker insertion  01/05/07    Medtronic, dual chamber. Dr. Lewayne Bunting MD.  . Bilateral total hip arthoplasties    . Insert / replace / remove pacemaker    . Total knee arthroplasty  Bilateral   . Cataract extraction, bilateral    . Abdominal hysterectomy     Family History  Problem Relation Age of Onset  . Coronary artery disease Other   . Stroke Other   . COPD Other    History  Substance Use Topics  . Smoking status: Never Smoker   . Smokeless tobacco: Never Used  . Alcohol Use: No   OB History    No data available     Review of Systems  Constitutional: Negative for fever and chills.  Respiratory: Positive for shortness of breath.   Cardiovascular: Positive for chest pain.  Gastrointestinal: Negative for nausea, vomiting, diarrhea and constipation.  Genitourinary: Negative for dysuria.  All other systems reviewed and are negative.     Allergies  Review of patient's allergies indicates no known allergies.  Home Medications   Prior to Admission medications   Medication Sig Start Date End Date Taking? Authorizing Provider  amLODipine (NORVASC) 5 MG tablet Take 5 mg by mouth daily.    Historical Provider, MD  atenolol (TENORMIN) 50 MG tablet Take 25 mg by mouth daily as needed (if blood pressure is abover 160/100).    Historical Provider, MD  citalopram (CELEXA) 20 MG tablet Take 20 mg by mouth daily.      Historical Provider, MD  dipyridamole-aspirin (AGGRENOX) 25-200 MG per 12 hr capsule Take 1 capsule by mouth 2 (two) times daily.      Historical Provider, MD  fish oil-omega-3 fatty acids 1000  MG capsule Take 1 g by mouth daily.     Historical Provider, MD  furosemide (LASIX) 20 MG tablet Take 20 mg by mouth as needed for fluid (take with potassium).     Historical Provider, MD  gabapentin (NEURONTIN) 100 MG capsule Take 100 mg by mouth 2 (two) times daily.     Historical Provider, MD  levothyroxine (SYNTHROID, LEVOTHROID) 100 MCG tablet Take 100 mcg by mouth daily before breakfast.    Historical Provider, MD  lisinopril (PRINIVIL,ZESTRIL) 40 MG tablet Take 40 mg by mouth daily.    Historical Provider, MD  Multiple Vitamin (MULTIVITAMIN) tablet  Take 1 tablet by mouth daily.      Historical Provider, MD  oxyCODONE-acetaminophen (PERCOCET) 10-325 MG per tablet Take 0.5 tablets by mouth every 8 (eight) hours as needed for pain. 04/28/13   Elease EtienneAnand D Hongalgi, MD  potassium chloride (K-DUR,KLOR-CON) 10 MEQ tablet Take 10 mEq by mouth daily as needed (take with furosemide).    Historical Provider, MD  pravastatin (PRAVACHOL) 40 MG tablet Take 40 mg by mouth at bedtime.      Historical Provider, MD  solifenacin (VESICARE) 5 MG tablet Take 5 mg by mouth daily.    Historical Provider, MD  traZODone (DESYREL) 100 MG tablet Take 0.5 tablets (50 mg total) by mouth at bedtime as needed for sleep. 04/28/13   Elease EtienneAnand D Hongalgi, MD  vitamin B-12 (CYANOCOBALAMIN) 1000 MCG tablet Take 1,000 mcg by mouth daily.    Historical Provider, MD   BP 206/85 mmHg  Pulse 68  Temp(Src) 98.4 F (36.9 C) (Oral)  Resp 16  Ht 5\' 5"  (1.651 m)  Wt 145 lb (65.772 kg)  BMI 24.13 kg/m2  SpO2 96% Physical Exam  Constitutional: She is oriented to person, place, and time. She appears well-developed and well-nourished.  HENT:  Head: Normocephalic and atraumatic.  Eyes: Conjunctivae and EOM are normal. Pupils are equal, round, and reactive to light.  Neck: Normal range of motion. Neck supple.  Cardiovascular: Normal rate and regular rhythm.  Exam reveals no gallop and no friction rub.   No murmur heard. Pulmonary/Chest: Effort normal and breath sounds normal. No respiratory distress. She has no wheezes. She has no rales. She exhibits no tenderness.  Abdominal: Soft. She exhibits no distension and no mass. There is no tenderness. There is no rebound and no guarding.  Musculoskeletal: Normal range of motion. She exhibits no edema or tenderness.  Neurological: She is alert and oriented to person, place, and time.  Skin: Skin is warm and dry.  Psychiatric: She has a normal mood and affect. Her behavior is normal. Judgment and thought content normal.  Nursing note and vitals  reviewed.   ED Course  Procedures (including critical care time) Results for orders placed or performed during the hospital encounter of 06/01/14  CBC  Result Value Ref Range   WBC 4.7 4.0 - 10.5 K/uL   RBC 4.08 3.87 - 5.11 MIL/uL   Hemoglobin 13.2 12.0 - 15.0 g/dL   HCT 62.140.2 30.836.0 - 65.746.0 %   MCV 98.5 78.0 - 100.0 fL   MCH 32.4 26.0 - 34.0 pg   MCHC 32.8 30.0 - 36.0 g/dL   RDW 84.612.8 96.211.5 - 95.215.5 %   Platelets 242 150 - 400 K/uL  Basic metabolic panel  Result Value Ref Range   Sodium 142 137 - 147 mEq/L   Potassium 4.3 3.7 - 5.3 mEq/L   Chloride 102 96 - 112 mEq/L   CO2 28 19 -  32 mEq/L   Glucose, Bld 101 (H) 70 - 99 mg/dL   BUN 19 6 - 23 mg/dL   Creatinine, Ser 1.610.67 0.50 - 1.10 mg/dL   Calcium 9.5 8.4 - 09.610.5 mg/dL   GFR calc non Af Amer 73 (L) >90 mL/min   GFR calc Af Amer 85 (L) >90 mL/min   Anion gap 12 5 - 15  Pro b natriuretic peptide (BNP)  Result Value Ref Range   Pro B Natriuretic peptide (BNP) 513.2 (H) 0 - 450 pg/mL  Protime-INR  Result Value Ref Range   Prothrombin Time 13.0 11.6 - 15.2 seconds   INR 0.97 0.00 - 1.49  I-stat troponin, [facility])  Result Value Ref Range   Troponin i, poc 0.01 0.00 - 0.08 ng/mL   Comment 3           Dg Chest Port 1 View  06/01/2014   CLINICAL DATA:  80 year old female with history of shortness of breath for 1 day and chest pain which began at 8 a.m. this morning. Will radiation of chest pain, no associated nausea, vomiting or diaphoresis.  EXAM: PORTABLE CHEST - 1 VIEW  COMPARISON:  Chest x-ray 04/27/2013.  FINDINGS: Low lung volumes. Eventration of the medial left hemidiaphragm. No consolidative airspace disease. No pleural effusions. Mild diffuse bronchial wall thickening. Mild cardiomegaly. Upper mediastinal contours are within normal limits. Atherosclerosis in the thoracic aorta. Left-sided pacemaker device in position with lead tips projecting over the expected location of the right atrium and right ventricular apex.   IMPRESSION: 1. Mild diffuse bronchial wall thickening. This may suggest an acute bronchitis. 2. Atherosclerosis. 3. New eventration of the medial left hemidiaphragm. This may suggest interval development of a Bochdalek's hernia.   Electronically Signed   By: Trudie Reedaniel  Entrikin M.D.   On: 06/01/2014 10:17     Imaging Review No results found.   EKG Interpretation   Date/Time:  Wednesday June 01 2014 09:41:51 EST Ventricular Rate:  68 PR Interval:  300 QRS Duration: 89 QT Interval:  424 QTC Calculation: 451 R Axis:   -22 Text Interpretation:  Sinus rhythm Prolonged PR interval Borderline left  axis deviation Abnormal ekg since last tracing no significant change  Confirmed by MILLER  MD, BRIAN ([id number]) on 06/01/2014 10:01:18 AM      MDM   Final diagnoses:  Chest pain, unspecified chest pain type    Patient with chest pain and shortness of breath. Symptoms have improved dramatically since arriving in the ED. She states that her pain is minimal. She is given aspirin by EMS. She has pacemaker, and is taking Aggrenox. We'll check basic labs, EKG, chest x-ray. Will reassess.  Patient seen by and discussed with Dr. Hyacinth MeekerMiller, who recommends cardiology consult given age and comorbidities.  She has been seen by cardiology, who recommends DC to home.  Patient has been pain free in the ED.  Cardiology suspects that the chest pain was secondary to hypertension. Hypertension has resolved with at-home medications.     Roxy Horsemanobert Fabiana Dromgoole, PA-C 06/01/14 1500  Vida RollerBrian D Miller, MD 06/02/14 [phone]

## 2014-06-01 NOTE — ED Notes (Signed)
Consulting civil engineerCharge RN at bedside to Chief Financial Officerinterrogate pace maker per [place]rowning, GeorgiaPA instructions

## 2014-06-01 NOTE — ED Notes (Signed)
Cardiology at bedside.

## 2014-06-01 NOTE — Discharge Instructions (Signed)

## 2014-06-01 NOTE — ED Provider Notes (Signed)
The patient is a 80 year old female, she has a history of congestive heart failure, history of possible sick sinus syndrome, has a pacemaker, reports having chest pain and shortness of breath that started last night and has been intermittent throughout the night until this morning. She denies any symptoms at this time and on exam has a soft murmur but otherwise clear heart sounds, clear lung sounds, no peripheral edema. EKG unremarkable, enteric getting pacemaker, labs unremarkable, chest x-ray does show a possible diaphragmatic hernia on the left which is not seen on prior x-rays, she does not seem to be symptomatically from that at this time.  D/w Cardiology they rec d/c  Medical screening examination/treatment/procedure(s) were conducted as a shared visit with non-physician practitioner(s) and myself.  I personally evaluated the patient during the encounter.  Clinical Impression:   Final diagnoses:  Chest pain, unspecified chest pain type         Vida RollerBrian D Shalan Neault, MD 06/02/14 [id number]

## 2014-06-01 NOTE — ED Notes (Signed)
Per EMS: pt from home for eval of sob x1 day and cp that started this morning at 0800, Pt denies any radiation, denies any n/v/d or diaphoresis. Pt took 4 baby aspirin and denies any cp at this time. Pt currently has pacemaker, EMS noted when pt not being paced pt in NSR with 1st degree heart block, reports HR 30-80. No daily meds taken by pt, EMS reports pt hypertensive. NAD, pt axox 4.

## 2014-06-13 ENCOUNTER — Ambulatory Visit (INDEPENDENT_AMBULATORY_CARE_PROVIDER_SITE_OTHER): Payer: Medicare Other | Admitting: Physician Assistant

## 2014-06-13 ENCOUNTER — Encounter: Payer: Self-pay | Admitting: Physician Assistant

## 2014-06-13 VITALS — BP 110/78 | HR 63 | Ht 65.0 in | Wt 143.0 lb

## 2014-06-13 DIAGNOSIS — R0789 Other chest pain: Secondary | ICD-10-CM

## 2014-06-13 DIAGNOSIS — I10 Essential (primary) hypertension: Secondary | ICD-10-CM

## 2014-06-13 DIAGNOSIS — F0390 Unspecified dementia without behavioral disturbance: Secondary | ICD-10-CM

## 2014-06-13 DIAGNOSIS — I5032 Chronic diastolic (congestive) heart failure: Secondary | ICD-10-CM

## 2014-06-13 DIAGNOSIS — I495 Sick sinus syndrome: Secondary | ICD-10-CM

## 2014-06-13 NOTE — Progress Notes (Signed)
[address] [place], [place]  [address] Phone: [phone] Fax:  [phone]) [phone]  Date:  06/13/2014   Patient ID:  Dana PilonKathleen W Bukowski, DOB 11/19/1918, MRN [id number]   PCP:  Ailene RavelHAMRICK,MAURA L, MD  Cardiologist:  Dr. Ladona Ridgelaylor   History of Present Illness: [name] is a 80 y.o. female with history of mild-moderate dementia, HTN, HLD, remote TIA/CVA, symptomatic bradycardia s/p MDT pacemaker 2008, ?cerebral stent, chronic diastolic CHF (although no significant admissions) who presents to the hospital with CP and SOB. She has no known hx of CAD. She presents for follow-up post-ER visit.  I saw her with Dr. Elease HashimotoNahser on 06/01/14 in the ER for an episode of chest discomfort with negative workup. She lives alone but has an aide that stays with her and multiple children who look after her including helping with med management. Unfortunately the patient's memory is poor and she did not recall any details of why she came to the hospital (daughter reported this as baseline). She apparently told her son that she "needed some help" and indicated that she was having chest discomfort and SOB. She was unable to provide any other details about the quality, location, or intensity of discomfort and denied associated cough, nausea, vomiting, palpitations, LEE, orthopnea, abdominal swelling, change in appetite, near-syncope, syncope or recent known high BPs at home. Her son gave her 324mg  ASA and called 911. Initial ED BP was 206/85 - she was given amlodipine 5mg  and lisinopril 40mg  with improvement in BP to 130/60. Pacemaker interrogation showed normal function - no significant events; 1 mode switch <0.1% but no ventricular high rate episodes. She was not tachycardic, tachypnic or hypoxic. Labs showed mild pBNP 513 but weight stable from 11/2013. Troponin neg x 1. Otherwise CBC, BMET are unremarkable. CXR with mild diffuse bronchial wall thickening, atherosclerosis, eventration of the medial left hemidiaphragm  (may suggest interval development of a Bochdalek's hernia). She is not very active. She reported eating a fair amount of salt and was advised to back down. It was unclear what precipitated her increased blood pressure. Chest pain may have been precipitated by transient accelerated HTN. In light of her age and comorbidities to include dementia and history of stroke, we recommended to manage conservatively and observe for further symptoms as an outpatient. She was continued on home aggrenox and home meds with instructions to f/u BP at home.  She is here with her son. She denies any known recurrence of chest pain. She has poor memory but family says she has overall been doing well. She feels good today. BP has been controlled at home. No syncope, SOB, orthopnea, or LEE.  Recent Labs: 06/01/2014: Creatinine 0.67; Hemoglobin 13.2; Potassium 4.3; Pro B Natriuretic peptide (BNP) 513.2*  Wt Readings from Last 3 Encounters:  06/13/14 143 lb (64.864 kg)  06/01/14 145 lb (65.772 kg)  11/18/13 146 lb (66.225 kg)     Past Medical History  Diagnosis Date  . Cardiac pacemaker in situ   . Dizziness and giddiness   . Symptomatic bradycardia     a. Near syncope s/p Medtronic pacemaker 2008.  . Iron deficiency anemia, unspecified   . Hypothyroidism   . HTN (hypertension)   . Pure hypercholesterolemia   . Chronic diastolic CHF (congestive heart failure)   . Complication of anesthesia     ANXIETY  . Pacemaker   . Osteoarthrosis, unspecified whether generalized or localized, unspecified site     OA  . Confusion Adm - 04/27/2013  .  TIA (transient ischemic attack)   . Overactive bladder   . History of CVA (cerebrovascular accident)   . Multiple falls   . Dementia     Current Outpatient Prescriptions  Medication Sig Dispense Refill  . amLODipine (NORVASC) 5 MG tablet Take 5 mg by mouth daily.    Marland Kitchen atenolol (TENORMIN) 50 MG tablet Take 25 mg by mouth daily as needed (if blood pressure is abover  160/100).    . CALCIUM PO Take 1 tablet by mouth daily.    . citalopram (CELEXA) 20 MG tablet Take 20 mg by mouth daily.      Marland Kitchen dipyridamole-aspirin (AGGRENOX) 25-200 MG per 12 hr capsule Take 1 capsule by mouth 2 (two) times daily.      . fish oil-omega-3 fatty acids 1000 MG capsule Take 1 g by mouth daily.     . furosemide (LASIX) 20 MG tablet Take 20 mg by mouth as needed for fluid (take with potassium).     Marland Kitchen gabapentin (NEURONTIN) 100 MG capsule Take 100 mg by mouth 2 (two) times daily.     Marland Kitchen levothyroxine (SYNTHROID, LEVOTHROID) 100 MCG tablet Take 100 mcg by mouth daily before breakfast.    . MELATONIN PO Take 1 tablet by mouth daily.    . Multiple Vitamin (MULTIVITAMIN) tablet Take 1 tablet by mouth daily.      . potassium chloride (K-DUR,KLOR-CON) 10 MEQ tablet Take 10 mEq by mouth daily as needed (take with furosemide).    . pravastatin (PRAVACHOL) 40 MG tablet Take 40 mg by mouth at bedtime.      . solifenacin (VESICARE) 5 MG tablet Take 5 mg by mouth daily.    . traZODone (DESYREL) 100 MG tablet Take 0.5 tablets (50 mg total) by mouth at bedtime as needed for sleep.    . vitamin B-12 (CYANOCOBALAMIN) 1000 MCG tablet Take 1,000 mcg by mouth daily.    . nitroGLYCERIN (NITROSTAT) 0.4 MG SL tablet Place 1 tablet (0.4 mg total) under the tongue every 5 (five) minutes as needed for chest pain. (Patient not taking: Reported on 06/13/2014) 30 tablet 0  . oxyCODONE-acetaminophen (PERCOCET) 10-325 MG per tablet Take 0.5 tablets by mouth every 8 (eight) hours as needed for pain. (Patient not taking: Reported on 06/13/2014)     No current facility-administered medications for this visit.    Allergies:   Review of patient's allergies indicates no known allergies.   Social History:  The patient  reports that she has never smoked. She has never used smokeless tobacco. She reports that she does not drink alcohol or use illicit drugs.   Family History:  The patient's family history includes COPD  in her other; Coronary artery disease in her other; Stroke in her other.   ROS:  Please see the history of present illness.   All other systems reviewed and negative.   PHYSICAL EXAM:  VS:  BP 110/78 mmHg  Pulse 63  Ht 5\' 5"  (1.651 m)  Wt 143 lb (64.864 kg)  BMI 23.80 kg/m2 Well nourished, well developed elderly WF, in no acute distress HEENT: normal Neck: no JVD, kyphotic posture Cardiac:  normal S1, S2; RRR; no murmur Lungs:  clear to auscultation bilaterally, no wheezing, rhonchi or rales Abd: soft, nontender, no hepatomegaly Ext: no edema Skin: warm and dry Neuro:  follows commands, answers questions appropriately to the best of her ability but cannot really remember the ER visit that day  EKG:  Atrial paced 63bpm with prolonged AV conduction, no  acute changes     ASSESSMENT AND PLAN:  1. Chest discomfort - at this time felt related to accelerated HTN in the ER that day. No further recurrence. Will continue to manage conservatively and observe for further symptoms. Continue Aggrenox and statin. 2. HTN - currently normotensive. Continue current regimen. 3. Mild-moderate dementia - at baseline. 4. Sinus node dysfunction s/p pacemaker - functioning normally by recent ED visit. F/u Dr. Ladona Ridgelaylor 10/2014 as previously planned. 5. Chronic diastolic CHF - noted by history but no hospitalizations within the last 5 years for this. Appears euvolemic.  Dispo: F/u Dr. Ladona Ridgelaylor as planned 10/2014.   Signed, Ronie Spiesayna Dunn, PA-C  06/13/2014 11:52 AM

## 2014-06-13 NOTE — Patient Instructions (Addendum)
Your physician recommends that you continue on your current medications as directed. Please refer to the Current Medication list given to you today.     NEEDS PACEMAKER  CHECK IN MAY WITH DR Ladona RidgelAYLOR

## 2014-06-30 DIAGNOSIS — I10 Essential (primary) hypertension: Secondary | ICD-10-CM | POA: Diagnosis not present

## 2014-07-21 ENCOUNTER — Ambulatory Visit: Payer: Self-pay | Admitting: Physician Assistant

## 2014-07-26 DIAGNOSIS — I10 Essential (primary) hypertension: Secondary | ICD-10-CM | POA: Diagnosis not present

## 2014-07-26 DIAGNOSIS — R32 Unspecified urinary incontinence: Secondary | ICD-10-CM | POA: Diagnosis not present

## 2014-07-26 DIAGNOSIS — Z23 Encounter for immunization: Secondary | ICD-10-CM | POA: Diagnosis not present

## 2014-07-26 DIAGNOSIS — E780 Pure hypercholesterolemia: Secondary | ICD-10-CM | POA: Diagnosis not present

## 2014-07-26 DIAGNOSIS — Z9181 History of falling: Secondary | ICD-10-CM | POA: Diagnosis not present

## 2014-07-26 DIAGNOSIS — E039 Hypothyroidism, unspecified: Secondary | ICD-10-CM | POA: Diagnosis not present

## 2014-08-01 ENCOUNTER — Ambulatory Visit: Payer: Self-pay | Admitting: Physician Assistant

## 2014-08-16 ENCOUNTER — Ambulatory Visit (INDEPENDENT_AMBULATORY_CARE_PROVIDER_SITE_OTHER): Payer: Medicare Other | Admitting: Internal Medicine

## 2014-08-16 ENCOUNTER — Encounter: Payer: Self-pay | Admitting: Internal Medicine

## 2014-08-16 ENCOUNTER — Other Ambulatory Visit: Payer: Self-pay | Admitting: Internal Medicine

## 2014-08-16 VITALS — BP 144/64 | HR 70 | Ht 65.0 in | Wt 143.1 lb

## 2014-08-16 DIAGNOSIS — I495 Sick sinus syndrome: Secondary | ICD-10-CM

## 2014-08-16 DIAGNOSIS — Z950 Presence of cardiac pacemaker: Secondary | ICD-10-CM | POA: Diagnosis not present

## 2014-08-16 DIAGNOSIS — I10 Essential (primary) hypertension: Secondary | ICD-10-CM | POA: Diagnosis not present

## 2014-08-16 DIAGNOSIS — R0789 Other chest pain: Secondary | ICD-10-CM

## 2014-08-16 DIAGNOSIS — I5032 Chronic diastolic (congestive) heart failure: Secondary | ICD-10-CM | POA: Diagnosis not present

## 2014-08-16 LAB — MDC_IDC_ENUM_SESS_TYPE_INCLINIC
Battery Impedance: 2019 Ohm
Battery Remaining Longevity: 25 mo
Brady Statistic AP VP Percent: 1 %
Brady Statistic AP VS Percent: 70 %
Brady Statistic AS VP Percent: 0 %
Date Time Interrogation Session: 20160301144813
Lead Channel Impedance Value: 568 Ohm
Lead Channel Impedance Value: 616 Ohm
Lead Channel Pacing Threshold Amplitude: 0.5 V
Lead Channel Pacing Threshold Pulse Width: 0.4 ms
Lead Channel Sensing Intrinsic Amplitude: 15.67 mV
Lead Channel Sensing Intrinsic Amplitude: 2 mV
Lead Channel Setting Pacing Pulse Width: 0.4 ms
MDC IDC MSMT BATTERY VOLTAGE: 2.72 V
MDC IDC MSMT LEADCHNL RV PACING THRESHOLD AMPLITUDE: 0.5 V
MDC IDC MSMT LEADCHNL RV PACING THRESHOLD PULSEWIDTH: 0.4 ms
MDC IDC SET LEADCHNL RA PACING AMPLITUDE: 2 V
MDC IDC SET LEADCHNL RV PACING AMPLITUDE: 2.5 V
MDC IDC SET LEADCHNL RV SENSING SENSITIVITY: 5.6 mV
MDC IDC STAT BRADY AS VS PERCENT: 28 %

## 2014-08-16 NOTE — Assessment & Plan Note (Signed)
Her blood pressure has been more labile. I have instructed her son to hold her blood pressure medications when her pressure is on the low side, and to give an additional dose of amlodipine and or atenolol when her blood pressure is high.

## 2014-08-16 NOTE — Assessment & Plan Note (Signed)
Her symptoms appear to be noncardiac. I've recommended watchful waiting.

## 2014-08-16 NOTE — Assessment & Plan Note (Signed)
Her Medtronic dual-chamber pacemaker is working normally. We'll plan to recheck in several months. 

## 2014-08-16 NOTE — Assessment & Plan Note (Signed)
Her symptoms are class II. She has become more sedentary in her advanced age. I encouraged the patient to reduce her salt intake.

## 2014-08-16 NOTE — Patient Instructions (Signed)

## 2014-08-16 NOTE — Progress Notes (Signed)
HPI [name] returns today for follow-up. She is a very pleasant 81 year old woman with a history of symptomatic bradycardia, status post pacemaker insertion, hypertension, with labile blood pressures. She also has chest pain, which is likely noncardiac and certainly nonexertional. Her son who is with her today notes that her blood pressure at times is in the 160-180 range, and at other times is in the 95-100 range. She denies peripheral edema. Minimal dyspnea though she is becoming more inactive. No Known Allergies   Current Outpatient Prescriptions  Medication Sig Dispense Refill  . amLODipine (NORVASC) 5 MG tablet Take 5 mg by mouth daily.    Marland Kitchen. atenolol (TENORMIN) 50 MG tablet Take 25 mg by mouth daily as needed (if blood pressure is abover 160/100).    . CALCIUM PO Take 1 tablet by mouth daily.    . citalopram (CELEXA) 20 MG tablet Take 20 mg by mouth daily.      Marland Kitchen. dipyridamole-aspirin (AGGRENOX) 25-200 MG per 12 hr capsule Take 1 capsule by mouth 2 (two) times daily.      . fish oil-omega-3 fatty acids 1000 MG capsule Take 1 g by mouth daily.     . furosemide (LASIX) 20 MG tablet Take 20 mg by mouth daily as needed for fluid (take with potassium).     Marland Kitchen. gabapentin (NEURONTIN) 100 MG capsule Take 100 mg by mouth 2 (two) times daily.     Marland Kitchen. levothyroxine (SYNTHROID, LEVOTHROID) 100 MCG tablet Take 100 mcg by mouth daily before breakfast.    . MELATONIN PO Take 1 tablet by mouth daily.    . Multiple Vitamin (MULTIVITAMIN) tablet Take 1 tablet by mouth daily.      . nitroGLYCERIN (NITROSTAT) 0.4 MG SL tablet Place 1 tablet (0.4 mg total) under the tongue every 5 (five) minutes as needed for chest pain. 30 tablet 0  . oxyCODONE-acetaminophen (PERCOCET) 10-325 MG per tablet Take 0.5 tablets by mouth every 8 (eight) hours as needed for pain.    . potassium chloride (K-DUR,KLOR-CON) 10 MEQ tablet Take 10 mEq by mouth daily as needed (take with furosemide).    . pravastatin (PRAVACHOL)  40 MG tablet Take 40 mg by mouth at bedtime.      . solifenacin (VESICARE) 5 MG tablet Take 5 mg by mouth daily.    . traZODone (DESYREL) 100 MG tablet Take 0.5 tablets (50 mg total) by mouth at bedtime as needed for sleep.    . vitamin B-12 (CYANOCOBALAMIN) 1000 MCG tablet Take 1,000 mcg by mouth daily.     No current facility-administered medications for this visit.     Past Medical History  Diagnosis Date  . Cardiac pacemaker in situ   . Dizziness and giddiness   . Symptomatic bradycardia     a. Near syncope s/p Medtronic pacemaker 2008.  . Iron deficiency anemia, unspecified   . Hypothyroidism   . HTN (hypertension)   . Pure hypercholesterolemia   . Chronic diastolic CHF (congestive heart failure)   . Complication of anesthesia     ANXIETY  . Pacemaker   . Osteoarthrosis, unspecified whether generalized or localized, unspecified site     OA  . Confusion Adm - 04/27/2013  . TIA (transient ischemic attack)   . Overactive bladder   . History of CVA (cerebrovascular accident)   . Multiple falls   . Dementia     ROS:   All systems reviewed and negative except as noted in the HPI.  Past Surgical History  Procedure Laterality Date  . Pacemaker insertion  01/05/07    Medtronic, dual chamber. Dr. Lewayne Bunting MD.  . Bilateral total hip arthoplasties    . Insert / replace / remove pacemaker    . Total knee arthroplasty Bilateral   . Cataract extraction, bilateral    . Abdominal hysterectomy       Family History  Problem Relation Age of Onset  . Coronary artery disease Other   . Stroke Other   . COPD Other      History   Social History  . Marital Status: Married    Spouse Name: N/A  . Number of Children: N/A  . Years of Education: N/A   Occupational History  . Not on file.   Social History Main Topics  . Smoking status: Never Smoker   . Smokeless tobacco: Never Used  . Alcohol Use: No  . Drug Use: No  . Sexual Activity: Not on file   Other Topics  Concern  . Not on file   Social History Narrative   Lives with her husband.      BP 144/64 mmHg  Pulse 70  Ht  (1.651 m)  Wt 143 lb 1.9 oz (64.919 kg)  BMI 23.82 kg/m2  Physical Exam:  Well appearing 81 year old woman, NAD HEENT: Unremarkable Neck:  6 cm JVD, no thyromegally Back:  No CVA tenderness Lungs:  Clear, with no wheezes, rales, or rhonchi. HEART:  Regular rate rhythm, no murmurs, no rubs, no clicks Abd:  soft, positive bowel sounds, no organomegally, no rebound, no guarding Ext:  2 plus pulses, no edema, no cyanosis, no clubbing Skin:  No rashes no nodules Neuro:  CN II through XII intact, motor grossly intact   DEVICE  Normal device function.  See PaceArt for details.   Assess/Plan:

## 2014-11-15 ENCOUNTER — Telehealth: Payer: Self-pay | Admitting: Cardiology

## 2014-11-15 ENCOUNTER — Ambulatory Visit (INDEPENDENT_AMBULATORY_CARE_PROVIDER_SITE_OTHER): Payer: Medicare Other | Admitting: *Deleted

## 2014-11-15 ENCOUNTER — Telehealth: Payer: Self-pay | Admitting: Internal Medicine

## 2014-11-15 DIAGNOSIS — I495 Sick sinus syndrome: Secondary | ICD-10-CM

## 2014-11-15 NOTE — Telephone Encounter (Signed)
Follow Up        Pt's caregiver calling to speak to device clinic about pt's remote transmission. Please call back and advise.

## 2014-11-15 NOTE — Telephone Encounter (Signed)
Confirmed remote transmission w/ pt caregiver.   

## 2014-11-16 DIAGNOSIS — I495 Sick sinus syndrome: Secondary | ICD-10-CM

## 2014-11-16 NOTE — Progress Notes (Signed)
Remote pacemaker transmission.   

## 2014-11-16 NOTE — Telephone Encounter (Signed)
Instructed pt caregiver how to send manual transmission.

## 2014-11-19 LAB — CUP PACEART REMOTE DEVICE CHECK
Battery Impedance: 1908 Ohm
Battery Voltage: 2.72 V
Brady Statistic AS VS Percent: 25 %
Lead Channel Impedance Value: 478 Ohm
Lead Channel Impedance Value: 541 Ohm
Lead Channel Pacing Threshold Amplitude: 0.375 V
Lead Channel Pacing Threshold Amplitude: 0.75 V
Lead Channel Pacing Threshold Pulse Width: 0.4 ms
Lead Channel Pacing Threshold Pulse Width: 0.4 ms
Lead Channel Sensing Intrinsic Amplitude: 1.4 mV
Lead Channel Setting Pacing Amplitude: 2 V
Lead Channel Setting Pacing Amplitude: 2.5 V
Lead Channel Setting Sensing Sensitivity: 4 mV
MDC IDC MSMT BATTERY REMAINING LONGEVITY: 27 mo
MDC IDC MSMT LEADCHNL RV SENSING INTR AMPL: 8 mV
MDC IDC SESS DTM: 20160601132755
MDC IDC SET LEADCHNL RV PACING PULSEWIDTH: 0.4 ms
MDC IDC STAT BRADY AP VP PERCENT: 3 %
MDC IDC STAT BRADY AP VS PERCENT: 72 %
MDC IDC STAT BRADY AS VP PERCENT: 1 %

## 2014-11-24 DIAGNOSIS — I10 Essential (primary) hypertension: Secondary | ICD-10-CM | POA: Diagnosis not present

## 2014-11-24 DIAGNOSIS — E039 Hypothyroidism, unspecified: Secondary | ICD-10-CM | POA: Diagnosis not present

## 2014-11-24 DIAGNOSIS — M1990 Unspecified osteoarthritis, unspecified site: Secondary | ICD-10-CM | POA: Diagnosis not present

## 2014-11-24 DIAGNOSIS — E780 Pure hypercholesterolemia: Secondary | ICD-10-CM | POA: Diagnosis not present

## 2014-11-24 DIAGNOSIS — Z9181 History of falling: Secondary | ICD-10-CM | POA: Diagnosis not present

## 2014-11-25 ENCOUNTER — Encounter: Payer: Self-pay | Admitting: Cardiology

## 2014-11-29 DIAGNOSIS — H43813 Vitreous degeneration, bilateral: Secondary | ICD-10-CM | POA: Diagnosis not present

## 2014-11-29 DIAGNOSIS — H3531 Nonexudative age-related macular degeneration: Secondary | ICD-10-CM | POA: Diagnosis not present

## 2014-12-06 ENCOUNTER — Encounter: Payer: Self-pay | Admitting: Internal Medicine

## 2015-02-15 ENCOUNTER — Telehealth: Payer: Self-pay | Admitting: Cardiology

## 2015-02-15 ENCOUNTER — Ambulatory Visit (INDEPENDENT_AMBULATORY_CARE_PROVIDER_SITE_OTHER): Payer: Medicare Other | Admitting: *Deleted

## 2015-02-15 DIAGNOSIS — I495 Sick sinus syndrome: Secondary | ICD-10-CM

## 2015-02-15 NOTE — Telephone Encounter (Signed)
Confirmed remote transmission w/ pt caregiver.   

## 2015-02-15 NOTE — Progress Notes (Signed)
Remote pacemaker transmission.   

## 2015-02-27 DIAGNOSIS — Z6824 Body mass index (BMI) 24.0-24.9, adult: Secondary | ICD-10-CM | POA: Diagnosis not present

## 2015-02-27 DIAGNOSIS — Z139 Encounter for screening, unspecified: Secondary | ICD-10-CM | POA: Diagnosis not present

## 2015-02-27 DIAGNOSIS — R350 Frequency of micturition: Secondary | ICD-10-CM | POA: Diagnosis not present

## 2015-02-27 LAB — CUP PACEART REMOTE DEVICE CHECK
Battery Impedance: 2070 Ohm
Battery Remaining Longevity: 25 mo
Brady Statistic AP VS Percent: 74.7 %
Brady Statistic AS VS Percent: 21.3 %
Date Time Interrogation Session: 20160912103256
Lead Channel Impedance Value: 558 Ohm
Lead Channel Impedance Value: 570 Ohm
Lead Channel Pacing Threshold Pulse Width: 0.4 ms
Lead Channel Pacing Threshold Pulse Width: 0.4 ms
Lead Channel Sensing Intrinsic Amplitude: 1.4 mV
Lead Channel Sensing Intrinsic Amplitude: 8 mV
MDC IDC MSMT BATTERY VOLTAGE: 2.72 V
MDC IDC MSMT LEADCHNL RA PACING THRESHOLD AMPLITUDE: 0.375 V
MDC IDC MSMT LEADCHNL RV PACING THRESHOLD AMPLITUDE: 0.75 V
MDC IDC SET LEADCHNL RA PACING AMPLITUDE: 2 V
MDC IDC SET LEADCHNL RV PACING AMPLITUDE: 2.5 V
MDC IDC SET LEADCHNL RV PACING PULSEWIDTH: 0.4 ms
MDC IDC SET LEADCHNL RV SENSING SENSITIVITY: 4 mV
MDC IDC STAT BRADY AP VP PERCENT: 3.1 %
MDC IDC STAT BRADY AS VP PERCENT: 0.8 %

## 2015-02-28 ENCOUNTER — Emergency Department (HOSPITAL_COMMUNITY): Payer: Medicare Other

## 2015-02-28 ENCOUNTER — Encounter (HOSPITAL_COMMUNITY): Payer: Self-pay

## 2015-02-28 ENCOUNTER — Inpatient Hospital Stay (HOSPITAL_COMMUNITY)
Admission: EM | Admit: 2015-02-28 | Discharge: 2015-03-02 | DRG: 281 | Disposition: A | Discharge status: Home Health | Payer: Medicare Other | Attending: Internal Medicine | Admitting: Internal Medicine

## 2015-02-28 ENCOUNTER — Inpatient Hospital Stay (HOSPITAL_COMMUNITY): Payer: Medicare Other

## 2015-02-28 DIAGNOSIS — T502X5A Adverse effect of carbonic-anhydrase inhibitors, benzothiadiazides and other diuretics, initial encounter: Secondary | ICD-10-CM | POA: Diagnosis not present

## 2015-02-28 DIAGNOSIS — R509 Fever, unspecified: Secondary | ICD-10-CM | POA: Diagnosis not present

## 2015-02-28 DIAGNOSIS — Z96653 Presence of artificial knee joint, bilateral: Secondary | ICD-10-CM | POA: Diagnosis present

## 2015-02-28 DIAGNOSIS — Z23 Encounter for immunization: Secondary | ICD-10-CM | POA: Diagnosis not present

## 2015-02-28 DIAGNOSIS — Z950 Presence of cardiac pacemaker: Secondary | ICD-10-CM | POA: Diagnosis present

## 2015-02-28 DIAGNOSIS — I5042 Chronic combined systolic (congestive) and diastolic (congestive) heart failure: Secondary | ICD-10-CM | POA: Diagnosis not present

## 2015-02-28 DIAGNOSIS — E039 Hypothyroidism, unspecified: Secondary | ICD-10-CM | POA: Diagnosis not present

## 2015-02-28 DIAGNOSIS — D509 Iron deficiency anemia, unspecified: Secondary | ICD-10-CM | POA: Diagnosis not present

## 2015-02-28 DIAGNOSIS — E7800 Pure hypercholesterolemia, unspecified: Secondary | ICD-10-CM | POA: Diagnosis present

## 2015-02-28 DIAGNOSIS — F0390 Unspecified dementia without behavioral disturbance: Secondary | ICD-10-CM | POA: Diagnosis not present

## 2015-02-28 DIAGNOSIS — Z8673 Personal history of transient ischemic attack (TIA), and cerebral infarction without residual deficits: Secondary | ICD-10-CM

## 2015-02-28 DIAGNOSIS — R0902 Hypoxemia: Secondary | ICD-10-CM | POA: Diagnosis not present

## 2015-02-28 DIAGNOSIS — I248 Other forms of acute ischemic heart disease: Secondary | ICD-10-CM | POA: Diagnosis not present

## 2015-02-28 DIAGNOSIS — I495 Sick sinus syndrome: Secondary | ICD-10-CM

## 2015-02-28 DIAGNOSIS — I214 Non-ST elevation (NSTEMI) myocardial infarction: Secondary | ICD-10-CM | POA: Diagnosis not present

## 2015-02-28 DIAGNOSIS — E876 Hypokalemia: Secondary | ICD-10-CM | POA: Diagnosis not present

## 2015-02-28 DIAGNOSIS — R7989 Other specified abnormal findings of blood chemistry: Secondary | ICD-10-CM | POA: Diagnosis not present

## 2015-02-28 DIAGNOSIS — R4182 Altered mental status, unspecified: Secondary | ICD-10-CM | POA: Diagnosis not present

## 2015-02-28 DIAGNOSIS — E785 Hyperlipidemia, unspecified: Secondary | ICD-10-CM | POA: Diagnosis not present

## 2015-02-28 DIAGNOSIS — N390 Urinary tract infection, site not specified: Secondary | ICD-10-CM | POA: Diagnosis present

## 2015-02-28 DIAGNOSIS — I517 Cardiomegaly: Secondary | ICD-10-CM | POA: Diagnosis not present

## 2015-02-28 DIAGNOSIS — B9620 Unspecified Escherichia coli [E. coli] as the cause of diseases classified elsewhere: Secondary | ICD-10-CM | POA: Diagnosis present

## 2015-02-28 DIAGNOSIS — I5032 Chronic diastolic (congestive) heart failure: Secondary | ICD-10-CM | POA: Diagnosis present

## 2015-02-28 DIAGNOSIS — I10 Essential (primary) hypertension: Secondary | ICD-10-CM | POA: Diagnosis not present

## 2015-02-28 DIAGNOSIS — R0600 Dyspnea, unspecified: Secondary | ICD-10-CM | POA: Diagnosis not present

## 2015-02-28 DIAGNOSIS — R0602 Shortness of breath: Secondary | ICD-10-CM | POA: Diagnosis present

## 2015-02-28 HISTORY — DX: Depression, unspecified: F32.A

## 2015-02-28 HISTORY — DX: Non-ST elevation (NSTEMI) myocardial infarction: I21.4

## 2015-02-28 HISTORY — DX: Unspecified osteoarthritis, unspecified site: M19.90

## 2015-02-28 HISTORY — DX: Major depressive disorder, single episode, unspecified: F32.9

## 2015-02-28 HISTORY — DX: Anxiety disorder, unspecified: F41.9

## 2015-02-28 LAB — URINE MICROSCOPIC-ADD ON

## 2015-02-28 LAB — URINALYSIS, ROUTINE W REFLEX MICROSCOPIC
Bilirubin Urine: NEGATIVE
GLUCOSE, UA: NEGATIVE mg/dL
Ketones, ur: 15 mg/dL — AB
NITRITE: NEGATIVE
PH: 6 (ref 5.0–8.0)
Protein, ur: NEGATIVE mg/dL
Specific Gravity, Urine: 1.012 (ref 1.005–1.030)
Urobilinogen, UA: 1 mg/dL (ref 0.0–1.0)

## 2015-02-28 LAB — CBC WITH DIFFERENTIAL/PLATELET
BASOS ABS: 0 10*3/uL (ref 0.0–0.1)
Basophils Relative: 0 % (ref 0–1)
EOS ABS: 0 10*3/uL (ref 0.0–0.7)
EOS PCT: 0 % (ref 0–5)
HEMATOCRIT: 32.6 % — AB (ref 36.0–46.0)
Hemoglobin: 10.9 g/dL — ABNORMAL LOW (ref 12.0–15.0)
Lymphocytes Relative: 3 % — ABNORMAL LOW (ref 12–46)
Lymphs Abs: 0.3 10*3/uL — ABNORMAL LOW (ref 0.7–4.0)
MCH: 32.5 pg (ref 26.0–34.0)
MCHC: 33.4 g/dL (ref 30.0–36.0)
MCV: 97.3 fL (ref 78.0–100.0)
MONO ABS: 1.2 10*3/uL — AB (ref 0.1–1.0)
Monocytes Relative: 13 % — ABNORMAL HIGH (ref 3–12)
Neutro Abs: 7.4 10*3/uL (ref 1.7–7.7)
Neutrophils Relative %: 84 % — ABNORMAL HIGH (ref 43–77)
PLATELETS: 140 10*3/uL — AB (ref 150–400)
RBC: 3.35 MIL/uL — AB (ref 3.87–5.11)
RDW: 12.7 % (ref 11.5–15.5)
WBC: 8.9 10*3/uL (ref 4.0–10.5)

## 2015-02-28 LAB — COMPREHENSIVE METABOLIC PANEL
ALBUMIN: 2.9 g/dL — AB (ref 3.5–5.0)
ALT: 14 U/L (ref 14–54)
ANION GAP: 7 (ref 5–15)
AST: 30 U/L (ref 15–41)
Alkaline Phosphatase: 62 U/L (ref 38–126)
BUN: 15 mg/dL (ref 6–20)
CHLORIDE: 97 mmol/L — AB (ref 101–111)
CO2: 27 mmol/L (ref 22–32)
Calcium: 8.3 mg/dL — ABNORMAL LOW (ref 8.9–10.3)
Creatinine, Ser: 0.88 mg/dL (ref 0.44–1.00)
GFR calc Af Amer: >60 mL/min (ref >60)
GFR calc non Af Amer: 55 mL/min — ABNORMAL LOW (ref >60)
GLUCOSE: 112 mg/dL — AB (ref 65–99)
POTASSIUM: 3.3 mmol/L — AB (ref 3.5–5.1)
SODIUM: 131 mmol/L — AB (ref 135–145)
Total Bilirubin: 0.6 mg/dL (ref 0.3–1.2)
Total Protein: 5.3 g/dL — ABNORMAL LOW (ref 6.5–8.1)

## 2015-02-28 LAB — BRAIN NATRIURETIC PEPTIDE: B NATRIURETIC PEPTIDE 5: 539.6 pg/mL — AB (ref 0.0–100.0)

## 2015-02-28 LAB — I-STAT TROPONIN, ED: TROPONIN I, POC: 1.78 ng/mL — AB (ref 0.00–0.08)

## 2015-02-28 LAB — I-STAT CG4 LACTIC ACID, ED
LACTIC ACID, VENOUS: 1.08 mmol/L (ref 0.5–2.0)
LACTIC ACID, VENOUS: 0.82 mmol/L (ref 0.5–2.0)

## 2015-02-28 LAB — LACTIC ACID, PLASMA
LACTIC ACID, VENOUS: 0.9 mmol/L (ref 0.5–2.0)
Lactic Acid, Venous: 1 mmol/L (ref 0.5–2.0)

## 2015-02-28 LAB — TSH: TSH: 1.112 u[IU]/mL (ref 0.350–4.500)

## 2015-02-28 LAB — MAGNESIUM: MAGNESIUM: 1.5 mg/dL — AB (ref 1.7–2.4)

## 2015-02-28 LAB — HEPARIN LEVEL (UNFRACTIONATED): Heparin Unfractionated: 0.3 IU/mL (ref 0.30–0.70)

## 2015-02-28 LAB — D-DIMER, QUANTITATIVE: D-Dimer, Quant: 1.59 ug/mL-FEU — ABNORMAL HIGH (ref 0.00–0.48)

## 2015-02-28 LAB — TROPONIN I
TROPONIN I: 1.6 ng/mL — AB (ref <0.031)
TROPONIN I: 1.32 ng/mL — AB (ref <0.031)

## 2015-02-28 MED ORDER — GABAPENTIN 100 MG PO CAPS
100.0000 mg | ORAL_CAPSULE | Freq: Two times a day (BID) | ORAL | Status: DC
  Administered 2015-02-28 – 2015-03-02 (×4): 100 mg via ORAL
  Filled 2015-02-28 (×4): qty 1

## 2015-02-28 MED ORDER — INFLUENZA VAC SPLIT QUAD 0.5 ML IM SUSY
0.5000 mL | PREFILLED_SYRINGE | INTRAMUSCULAR | Status: AC
  Administered 2015-03-01: 0.5 mL via INTRAMUSCULAR
  Filled 2015-02-28: qty 0.5

## 2015-02-28 MED ORDER — SODIUM CHLORIDE 0.9 % IV SOLN: 250.0000 mL | INTRAVENOUS | Status: DC | PRN

## 2015-02-28 MED ORDER — SODIUM CHLORIDE 0.9 % IJ SOLN
3.0000 mL | Freq: Two times a day (BID) | INTRAMUSCULAR | Status: DC
Start: 2015-02-28 — End: 2015-03-02
  Administered 2015-02-28 – 2015-03-02 (×3): 3 mL via INTRAVENOUS

## 2015-02-28 MED ORDER — SODIUM CHLORIDE 0.9 % IV BOLUS (SEPSIS)
500.0000 mL | Freq: Once | INTRAVENOUS | Status: AC
  Administered 2015-02-28: 500 mL via INTRAVENOUS

## 2015-02-28 MED ORDER — OMEGA-3-ACID ETHYL ESTERS 1 G PO CAPS
1.0000 g | ORAL_CAPSULE | Freq: Every day | ORAL | Status: DC
  Administered 2015-02-28 – 2015-03-02 (×3): 1 g via ORAL
  Filled 2015-02-28 (×3): qty 1

## 2015-02-28 MED ORDER — SODIUM CHLORIDE 0.9 % IJ SOLN: 3.0000 mL | INTRAMUSCULAR | Status: DC | PRN

## 2015-02-28 MED ORDER — IOHEXOL 350 MG/ML SOLN
100.0000 mL | Freq: Once | INTRAVENOUS | Status: AC | PRN
  Administered 2015-02-28: 100 mL via INTRAVENOUS

## 2015-02-28 MED ORDER — ONDANSETRON HCL 4 MG/2ML IJ SOLN: 4.0000 mg | Freq: Four times a day (QID) | INTRAMUSCULAR | Status: DC | PRN

## 2015-02-28 MED ORDER — TRAZODONE HCL 50 MG PO TABS
50.0000 mg | ORAL_TABLET | Freq: Every evening | ORAL | Status: DC | PRN
  Administered 2015-02-28 – 2015-03-02 (×2): 50 mg via ORAL
  Filled 2015-02-28 (×3): qty 1

## 2015-02-28 MED ORDER — OXYCODONE-ACETAMINOPHEN 10-325 MG PO TABS: 0.5000 | ORAL_TABLET | Freq: Two times a day (BID) | ORAL | Status: DC

## 2015-02-28 MED ORDER — OXYCODONE-ACETAMINOPHEN 5-325 MG PO TABS
1.0000 | ORAL_TABLET | Freq: Two times a day (BID) | ORAL | Status: DC
  Administered 2015-02-28 – 2015-03-02 (×4): 1 via ORAL
  Filled 2015-02-28 (×4): qty 1

## 2015-02-28 MED ORDER — ATENOLOL 25 MG PO TABS: 25.0000 mg | ORAL_TABLET | Freq: Every day | ORAL | Status: DC | PRN

## 2015-02-28 MED ORDER — MAGNESIUM SULFATE 50 % IJ SOLN
3.0000 g | Freq: Once | INTRAVENOUS | Status: AC
  Administered 2015-02-28: 3 g via INTRAVENOUS
  Filled 2015-02-28: qty 6

## 2015-02-28 MED ORDER — VITAMIN B-12 1000 MCG PO TABS
1000.0000 ug | ORAL_TABLET | Freq: Every day | ORAL | Status: DC
  Administered 2015-02-28 – 2015-03-02 (×3): 1000 ug via ORAL
  Filled 2015-02-28 (×3): qty 1

## 2015-02-28 MED ORDER — ASPIRIN-DIPYRIDAMOLE ER 25-200 MG PO CP12
1.0000 | ORAL_CAPSULE | Freq: Once | ORAL | Status: AC
  Administered 2015-02-28: 1 via ORAL
  Filled 2015-02-28: qty 1

## 2015-02-28 MED ORDER — FUROSEMIDE 20 MG PO TABS
20.0000 mg | ORAL_TABLET | Freq: Every day | ORAL | Status: DC
  Administered 2015-03-01 – 2015-03-02 (×2): 20 mg via ORAL
  Filled 2015-02-28 (×2): qty 1

## 2015-02-28 MED ORDER — OMEGA-3 FATTY ACIDS 1000 MG PO CAPS: 1.0000 g | ORAL_CAPSULE | Freq: Every day | ORAL | Status: DC

## 2015-02-28 MED ORDER — CEFTRIAXONE SODIUM 1 G IJ SOLR
1.0000 g | Freq: Once | INTRAMUSCULAR | Status: AC
  Administered 2015-02-28: 1 g via INTRAVENOUS
  Filled 2015-02-28: qty 10

## 2015-02-28 MED ORDER — ASPIRIN-DIPYRIDAMOLE ER 25-200 MG PO CP12
1.0000 | ORAL_CAPSULE | Freq: Two times a day (BID) | ORAL | Status: DC
  Administered 2015-02-28 – 2015-03-01 (×2): 1 via ORAL
  Filled 2015-02-28 (×3): qty 1

## 2015-02-28 MED ORDER — LEVOTHYROXINE SODIUM 100 MCG PO TABS
100.0000 ug | ORAL_TABLET | Freq: Every day | ORAL | Status: DC
  Administered 2015-03-01 – 2015-03-02 (×2): 100 ug via ORAL
  Filled 2015-02-28 (×2): qty 1

## 2015-02-28 MED ORDER — HEPARIN (PORCINE) IN NACL 100-0.45 UNIT/ML-% IJ SOLN
900.0000 [IU]/h | INTRAMUSCULAR | Status: DC
  Administered 2015-02-28: 800 [IU]/h via INTRAVENOUS
  Administered 2015-03-01: 900 [IU]/h via INTRAVENOUS
  Filled 2015-02-28 (×2): qty 250

## 2015-02-28 MED ORDER — NITROGLYCERIN 0.4 MG SL SUBL: 0.4000 mg | SUBLINGUAL_TABLET | SUBLINGUAL | Status: DC | PRN

## 2015-02-28 MED ORDER — POTASSIUM CHLORIDE CRYS ER 20 MEQ PO TBCR
40.0000 meq | EXTENDED_RELEASE_TABLET | Freq: Once | ORAL | Status: AC
  Administered 2015-02-28: 40 meq via ORAL
  Filled 2015-02-28: qty 2

## 2015-02-28 MED ORDER — DEXTROSE 5 % IV SOLN
1.0000 g | INTRAVENOUS | Status: DC
  Administered 2015-03-01 – 2015-03-02 (×2): 1 g via INTRAVENOUS
  Filled 2015-02-28 (×2): qty 10

## 2015-02-28 MED ORDER — PRAVASTATIN SODIUM 40 MG PO TABS
40.0000 mg | ORAL_TABLET | Freq: Every day | ORAL | Status: DC
Start: 2015-02-28 — End: 2015-03-02
  Administered 2015-02-28 – 2015-03-01 (×2): 40 mg via ORAL
  Filled 2015-02-28 (×2): qty 1

## 2015-02-28 MED ORDER — ACETAMINOPHEN 325 MG PO TABS
650.0000 mg | ORAL_TABLET | ORAL | Status: DC | PRN
  Administered 2015-03-02: 650 mg via ORAL
  Filled 2015-02-28: qty 2

## 2015-02-28 MED ORDER — CITALOPRAM HYDROBROMIDE 20 MG PO TABS
20.0000 mg | ORAL_TABLET | Freq: Every day | ORAL | Status: DC
  Administered 2015-02-28 – 2015-03-02 (×3): 20 mg via ORAL
  Filled 2015-02-28 (×3): qty 1

## 2015-02-28 MED ORDER — FUROSEMIDE 20 MG PO TABS: 20.0000 mg | ORAL_TABLET | Freq: Every day | ORAL | Status: DC

## 2015-02-28 MED ORDER — FUROSEMIDE 20 MG PO TABS
20.0000 mg | ORAL_TABLET | Freq: Every day | ORAL | Status: DC
  Administered 2015-02-28: 20 mg via ORAL
  Filled 2015-02-28: qty 1

## 2015-02-28 MED ORDER — ASPIRIN EC 325 MG PO TBEC
325.0000 mg | DELAYED_RELEASE_TABLET | Freq: Once | ORAL | Status: DC
  Filled 2015-02-28: qty 1

## 2015-02-28 MED ORDER — ADULT MULTIVITAMIN W/MINERALS CH
1.0000 | ORAL_TABLET | Freq: Every day | ORAL | Status: DC
  Administered 2015-02-28 – 2015-03-02 (×3): 1 via ORAL
  Filled 2015-02-28 (×5): qty 1

## 2015-02-28 NOTE — Consult Note (Signed)
Patient ID: [name] MRN: [id number], DOB/AGE: [birth year]   Admit date: 02/28/2015   Primary Physician: Ailene Ravel, MD Primary Cardiologist: Dr. [name]  Pt. Profile:  81 y/o female with  a h/o sinus node dysfunction s/p PPM in 2008, chronic diastolic CHF, HTN, HLD, prior CVA and dementia presenting to the ED with a 3 day history of worsening dyspnea.   Problem List  Past Medical History  Diagnosis Date  . Cardiac pacemaker in situ   . Dizziness and giddiness   . Symptomatic bradycardia     a. Near syncope s/p Medtronic pacemaker 2008.  . Iron deficiency anemia, unspecified   . Hypothyroidism   . HTN (hypertension)   . Pure hypercholesterolemia   . Chronic diastolic CHF (congestive heart failure)   . Complication of anesthesia     ANXIETY  . Pacemaker   . Osteoarthrosis, unspecified whether generalized or localized, unspecified site     OA  . Confusion Adm - 04/27/2013  . TIA (transient ischemic attack)   . Overactive bladder   . History of CVA (cerebrovascular accident)   . Multiple falls   . Dementia     Past Surgical History  Procedure Laterality Date  . Pacemaker insertion  01/05/07    Medtronic, dual chamber. Dr. Lewayne Bunting MD.  . Bilateral total hip arthoplasties    . Insert / replace / remove pacemaker    . Total knee arthroplasty Bilateral   . Cataract extraction, bilateral    . Abdominal hysterectomy       Allergies  No Known Allergies  HPI  The patient is a 81 y/o female, followed by Dr [name]. Her past cardiac history is significant for sinus node dysfunction/symptomatic bradycardia for which she underwent PPM insertion in 2008. She has a h/o normal LV systolic function. 2D echo in 2008 showed her EF to be 60-65%. Her history is also notable for dementia, HTN, HLD, hypothryroidism, chronic diastolic CHF, h/o CVA and an overactive bladder.   She was recently seen by her PCP yesterday for increased urinary frequency and was diagnosed  with a UTI and placed on Bactrim.  She now presents to the [facility] ED with a complaint of worsening shortness of breath over a 3 day periord, along with generalized weakness. Her family notes her breathing at rest has been very labored. The patient denies any recent CP. Her family also notes that she is very sedentary but no recent complaints of leg pain or swelling.  In ER, patient was noted to be hypoxic on arrival with O2 sats of 79% on RA. This improved to the mid 90s on supplemental O2. CXR shows Cardiomegaly without failure. No acute cardiopulmonary disease. No infiltrate. She is afebrile. WBC is normal. D-dimer is abnormal at 1.59. Troponin is also abnormal at 1.60. She has mild anemia with Hgb of 10.9. She is mildy hypokalemic with K of 3.3. EKG shows SR with slightly prolonged QTc of 507 ms. UA shows trace amount of leukocytes. Nitrate negative.   No CT of the chest has been ordered.   Home Medications  Prior to Admission medications   Medication Sig Start Date End Date Taking? Authorizing Provider  amLODipine (NORVASC) 5 MG tablet Take 5 mg by mouth daily.   Yes Historical Provider, MD  atenolol (TENORMIN) 50 MG tablet Take 25 mg by mouth daily as needed (if blood pressure is abover 160/100).   Yes Historical Provider, MD  CALCIUM PO Take 1 tablet by mouth daily.  Yes Historical Provider, MD  citalopram (CELEXA) 20 MG tablet Take 20 mg by mouth daily.     Yes Historical Provider, MD  dipyridamole-aspirin (AGGRENOX) 25-200 MG per 12 hr capsule Take 1 capsule by mouth 2 (two) times daily.     Yes Historical Provider, MD  fish oil-omega-3 fatty acids 1000 MG capsule Take 1 g by mouth daily.    Yes Historical Provider, MD  furosemide (LASIX) 20 MG tablet Take 20 mg by mouth daily as needed for fluid (take with potassium).    Yes Historical Provider, MD  gabapentin (NEURONTIN) 100 MG capsule Take 100 mg by mouth 2 (two) times daily.    Yes Historical Provider, MD  levothyroxine (SYNTHROID,  LEVOTHROID) 100 MCG tablet Take 100 mcg by mouth daily before breakfast.   Yes Historical Provider, MD  Multiple Vitamin (MULTIVITAMIN) tablet Take 1 tablet by mouth daily.     Yes Historical Provider, MD  nitroGLYCERIN (NITROSTAT) 0.4 MG SL tablet Place 1 tablet (0.4 mg total) under the tongue every 5 (five) minutes as needed for chest pain. 06/01/14  Yes Roxy Horseman, PA-C  oxyCODONE-acetaminophen (PERCOCET) 10-325 MG per tablet Take 0.5 tablets by mouth every 8 (eight) hours as needed for pain. Patient taking differently: Take 0.5 tablets by mouth 2 (two) times daily.  04/28/13  Yes Elease Etienne, MD  pravastatin (PRAVACHOL) 40 MG tablet Take 40 mg by mouth at bedtime.     Yes Historical Provider, MD  vitamin B-12 (CYANOCOBALAMIN) 1000 MCG tablet Take 1,000 mcg by mouth daily.   Yes Historical Provider, MD  traZODone (DESYREL) 100 MG tablet Take 0.5 tablets (50 mg total) by mouth at bedtime as needed for sleep. 04/28/13   Elease Etienne, MD    Family History  Family History  Problem Relation Age of Onset  . Coronary artery disease Other   . Stroke Other   . COPD Other   . Tuberculosis Mother     Social History  Social History   Social History  . Marital Status: Married    Spouse Name: N/A  . Number of Children: N/A  . Years of Education: N/A   Occupational History  . Not on file.   Social History Main Topics  . Smoking status: Never Smoker   . Smokeless tobacco: Never Used  . Alcohol Use: No  . Drug Use: No  . Sexual Activity: Not on file   Other Topics Concern  . Not on file   Social History Narrative   Lives with her husband.      Review of Systems General:  No chills, fever, night sweats or weight changes.  Cardiovascular:  No chest pain, dyspnea on exertion, edema, orthopnea, palpitations, paroxysmal nocturnal dyspnea. Dermatological: No rash, lesions/masses Respiratory: No cough, dyspnea Urologic: No hematuria, dysuria Abdominal:   No nausea,  vomiting, diarrhea, bright red blood per rectum, melena, or hematemesis Neurologic:  No visual changes, wkns, changes in mental status. All other systems reviewed and are otherwise negative except as noted above.  Physical Exam  Blood pressure 119/61, pulse 59, temperature 99.8 F (37.7 C), temperature source Rectal, resp. rate 20, height 5\' 5"  (1.651 m), weight 143 lb (64.864 kg), SpO2 100 %.  General: Pleasant, NAD, dementia at baseline Psych: Normal affect. Neuro: Alert and oriented X 3. Moves all extremities spontaneously. HEENT: Normal  Neck: Supple without bruits or JVD. Lungs:  Resp regular and unlabored, CTA. Heart: RRR no s3, s4, or murmurs. Abdomen: Soft, non-tender, non-distended, BS +  x 4.  Extremities: No clubbing, cyanosis or edema. DP/PT/Radials 2+ and equal bilaterally.  Labs  Troponin [facility] Test)  Recent Labs  02/28/15 0932  TROPIPOC 1.78*    Recent Labs  02/28/15 1328  TROPONINI 1.60*   Lab Results  Component Value Date   WBC 8.9 02/28/2015   HGB 10.9* 02/28/2015   HCT 32.6* 02/28/2015   MCV 97.3 02/28/2015   PLT 140* 02/28/2015    Recent Labs Lab 02/28/15 0925  NA 131*  K 3.3*  CL 97*  CO2 27  BUN 15  CREATININE 0.88  CALCIUM 8.3*  PROT 5.3*  BILITOT 0.6  ALKPHOS 62  ALT 14  AST 30  GLUCOSE 112*   No results found for: CHOL, HDL, LDLCALC, TRIG Lab Results  Component Value Date   DDIMER 1.59* 02/28/2015     Radiology/Studies  Dg Chest 2 View  02/28/2015   CLINICAL DATA:  Altered mental status.  Dementia.  EXAM: CHEST  2 VIEW  COMPARISON:  06/01/2014.  FINDINGS: Cardiopericardial silhouette is enlarged. Tortuous thoracic aorta with arch atherosclerosis. Unchanged LEFT subclavian cardiac pacemaker. No airspace disease. No pleural effusion. Thoracic spondylosis and kyphosis.  IMPRESSION: Cardiomegaly without failure.  No acute cardiopulmonary disease.   Electronically Signed   By: Andreas Newport M.D.   On: 02/28/2015 10:01     ECG  NSR with prolonged QTc of 507 ms   ASSESSMENT AND PLAN  1. Dyspnea: 3 day history of progressive dyspnea, hypoxia and elevated d-dimer of 1.59 warrants CT to r/o PE. Her renal function is normal.  2. Elevated Troponin: initial troponin is 1.60. This may be secondary to possible PE but may also be a true ACS with dyspnea as her anginal equivalent. Agree with initiation of IV heparin until assessment is complete. She denies any chest pain and EKG shows no ischemia. Will continue heparin and will cycle cardiac enzymes x 3 to assess trend. Will also obtain a 2D echo to assess LVF and wall motion. If w/u points to an ischemic etiology, it will likely be best to treat medically given her advanced age and dementia.   3. HTN: BP is controlled.   4. HLD: on statin therapy with pravastatin.   5. UTI: management per primary.   6. Prolonged QTc: 507 ms on EKG. She is hypokalemic with K of 3.3. Repleat K. Mg pending. Monitor  7. PPM: Medtronic device. Patient had a remote download yesterday 02/27/15. Report: Impedance, sensing, auto capture thresholds consistent with previous measurements. Histograms appropriate for patient and level of activity. All other diagnostic data reviewed and is appropriate and stable for patient. Real time/magnet EGM shows appropriate sensing and capture. (2) mode switches (<0.1%)---both <30 sec, no EGMs. No ventricular high rate episodes. Estimated longevity 25 months (range: 11-38 months).   8. Chronic Diastolic CHF: BNP abnormal at 161 but CXR is w/o edema and she appears euvolemic on exam.   Signed, SIMMONS, BRITTAINY, PA-C 02/28/2015, 3:48 PM   History and all data above reviewed.  Patient examined.  I agree with the findings as above.  The patient has had progressive weakness and dyspnea.  She presents with SOB that her family reports.  She has a vague description of her symptoms.  She reports no chest pain however.  She has had no swelling.  She gets around  slowly in her house.  She is unsteady on her feet. She is noted to have a mild troponin and D dimer.    The patient  exam reveals COR:RRR  ,  Lungs: RRR  ,  Abd: Positive bowel sounds, no rebound no guarding, Ext No edema  .  All available labs, radiology testing, previous records reviewed. Agree with documented assessment and plan. Dyspnea:  She does have a mild BNP.  However, she otherwise does not seem to be hypervolemic.  She has not had any classic anginal symptoms.  I would continue to cycle cardiac enzymes and check an echocardiogram.  I do not think that she needs further diuresis.  If she has no clear enzyme trend and no significant abnormality on echo I would not pursue further cardiovascular testing.    Fayrene Fearing Makalynn Berwanger  6:10 PM  02/28/2015

## 2015-02-28 NOTE — ED Provider Notes (Signed)
CSN: [id number]     Arrival date & time 02/28/15  0836 History   First MD Initiated Contact with Patient 02/28/15 [phone]     Chief Complaint  Patient presents with  . Altered Mental Status  . Fever    HPI [name] is a 81 y.o. female presenting to the ED via EMS due to caregiver reporting that patient is not acting herself. History mainly from granddaughter and daughter who are present in room. They state that for the last week patient has been acting differently. One thing that is different about patient is her breathing. They state that her breathing has been shallow and mildly labored. Patient more short of breath with movement. Denies any oxygen use at home. Has h/o CHF but not currently on Lasix. No orthopnea, edema, or cough. Patient sleeping al ot more than usual and eating less.   Family also notes that for the last week the patient has been peeing a lot. They state that she does not drink enough for her to avoid as much as she has been going. She has started to wet her pants and she has no history of incontinence. Family start using depends due to frequent urination. A. fib patient saturates to  Every few hours. Daily went to PCPs office yesterday and she was diagnosed with UTI and given Bactrim. Urine has appeared dark. Patient denies any symptoms of dysuria. No recorded fevers per family. However they state that EMS on the way to the ED stating the patient had temperature.  Unsure if patient actually has altered mental status. Family states that she does have a history of dementia where she has good and bad days.   HCPOA - daughter  Past Medical History  Diagnosis Date  . Cardiac pacemaker in situ   . Dizziness and giddiness   . Symptomatic bradycardia     a. Near syncope s/p Medtronic pacemaker 2008.  . Iron deficiency anemia, unspecified   . Hypothyroidism   . HTN (hypertension)   . Pure hypercholesterolemia   . Chronic diastolic CHF (congestive heart failure)   .  Complication of anesthesia     ANXIETY  . Pacemaker   . Osteoarthrosis, unspecified whether generalized or localized, unspecified site     OA  . Confusion Adm - 04/27/2013  . TIA (transient ischemic attack)   . Overactive bladder   . History of CVA (cerebrovascular accident)   . Multiple falls   . Dementia    Past Surgical History  Procedure Laterality Date  . Pacemaker insertion  01/05/07    Medtronic, dual chamber. Dr. Lewayne Bunting MD.  . Bilateral total hip arthoplasties    . Insert / replace / remove pacemaker    . Total knee arthroplasty Bilateral   . Cataract extraction, bilateral    . Abdominal hysterectomy     Family History  Problem Relation Age of Onset  . Coronary artery disease Other   . Stroke Other   . COPD Other    Social History  Substance Use Topics  . Smoking status: Never Smoker   . Smokeless tobacco: Never Used  . Alcohol Use: No   OB History    No data available     Review of Systems  Constitutional: Negative for fever.  Respiratory: Positive for shortness of breath.   Cardiovascular: Negative for chest pain.  Genitourinary: Positive for frequency. Negative for dysuria.  Neurological: Positive for weakness.  ROS limited due to dementia in patient Also see HPI  Allergies  Review of patient's allergies indicates no known allergies.  Home Medications   Prior to Admission medications   Medication Sig Start Date End Date Taking? Authorizing Provider  amLODipine (NORVASC) 5 MG tablet Take 5 mg by mouth daily.    Historical Provider, MD  atenolol (TENORMIN) 50 MG tablet Take 25 mg by mouth daily as needed (if blood pressure is abover 160/100).    Historical Provider, MD  CALCIUM PO Take 1 tablet by mouth daily.    Historical Provider, MD  citalopram (CELEXA) 20 MG tablet Take 20 mg by mouth daily.      Historical Provider, MD  dipyridamole-aspirin (AGGRENOX) 25-200 MG per 12 hr capsule Take 1 capsule by mouth 2 (two) times daily.      Historical  Provider, MD  fish oil-omega-3 fatty acids 1000 MG capsule Take 1 g by mouth daily.     Historical Provider, MD  furosemide (LASIX) 20 MG tablet Take 20 mg by mouth daily as needed for fluid (take with potassium).     Historical Provider, MD  gabapentin (NEURONTIN) 100 MG capsule Take 100 mg by mouth 2 (two) times daily.     Historical Provider, MD  levothyroxine (SYNTHROID, LEVOTHROID) 100 MCG tablet Take 100 mcg by mouth daily before breakfast.    Historical Provider, MD  MELATONIN PO Take 1 tablet by mouth daily.    Historical Provider, MD  Multiple Vitamin (MULTIVITAMIN) tablet Take 1 tablet by mouth daily.      Historical Provider, MD  nitroGLYCERIN (NITROSTAT) 0.4 MG SL tablet Place 1 tablet (0.4 mg total) under the tongue every 5 (five) minutes as needed for chest pain. 06/01/14   Roxy Horseman, PA-C  oxyCODONE-acetaminophen (PERCOCET) 10-325 MG per tablet Take 0.5 tablets by mouth every 8 (eight) hours as needed for pain. 04/28/13   Elease Etienne, MD  potassium chloride (K-DUR,KLOR-CON) 10 MEQ tablet Take 10 mEq by mouth daily as needed (take with furosemide).    Historical Provider, MD  pravastatin (PRAVACHOL) 40 MG tablet Take 40 mg by mouth at bedtime.      Historical Provider, MD  solifenacin (VESICARE) 5 MG tablet Take 5 mg by mouth daily.    Historical Provider, MD  traZODone (DESYREL) 100 MG tablet Take 0.5 tablets (50 mg total) by mouth at bedtime as needed for sleep. 04/28/13   Elease Etienne, MD  vitamin B-12 (CYANOCOBALAMIN) 1000 MCG tablet Take 1,000 mcg by mouth daily.    Historical Provider, MD   BP 142/71 mmHg  Pulse 76  Temp(Src) 99.8 F (37.7 C) (Rectal)  Resp 20  Ht  (1.651 m)  Wt 143 lb (64.864 kg)  BMI 23.80 kg/m2  SpO2 91% Physical Exam  Constitutional: Vital signs are normal. She is cooperative. She is easily aroused. She appears ill. No distress.  HENT:  Head: Normocephalic and atraumatic.  Mouth/Throat: Oropharynx is clear and moist.  Nasal  canula in place  Eyes: EOM are normal. Pupils are equal, round, and reactive to light. No scleral icterus.  Neck: Normal range of motion. Neck supple. No JVD present. No thyromegaly present.  Cardiovascular: Normal rate, regular rhythm and intact distal pulses.   Pulmonary/Chest: Effort normal and breath sounds normal. She has no wheezes. She has no rales.  Abdominal: Soft. Bowel sounds are normal. There is no tenderness.  Musculoskeletal: She exhibits no edema.  Neurological: She is alert and easily aroused.  Skin: Skin is warm and dry. Bruising noted.  Psychiatric: She has a  normal mood and affect. Cognition and memory are impaired.    ED Course  Procedures (including critical care time) Labs Review Labs Reviewed - No data to display  Imaging Review No results found. I have personally reviewed and evaluated these images and lab results as part of my medical decision-making.   EKG Interpretation None      Beside cardiac [place]: RV not enlarged, IVC able to be collapsed   MDM   Final diagnoses:  None   Patient presented to ED due to worsening shortness of breath. She was recently diagnosed with UTI at PCP office yesterday. Was started on bactrim.   In ED, patient had low grade temp and O2 sat of 91%. She was started on 2L [place]. She appeared euvolemic and/or hypovolemia. She was given a 500cc bolus of NS.   Rocephin given x1 for UTI. Urine culture ordered. Labs ordered. Lactic acid normal. CBC with normal white count but morphology of WBC with toxic granulation. BNP elevated to 539.   Troponin elevated to 1.78. No prior troponin elevations. Multifactorial in nature. Consulted cardiology. EKG without signs of STEMI. Denies any CP. Troponin elevation could be multifactorial from infection and volume status. Patient was heparinized. Cardiology consulted in ED.  Will admit patient to Hospitalist service.   Caryl Ada, DO 02/28/2015, 8:50 AM PGY-2, [facility]      Pincus Large, DO 02/28/15 1216  I saw and evaluated the patient, reviewed the resident's note and I agree with the findings and plan. Please see associated encounter note.   EKG Interpretation   Date/Time:  Tuesday February 28 2015 09:22:22 EDT Ventricular Rate:  70 PR Interval:  283 QRS Duration: 99 QT Interval:  470 QTC Calculation: 507 R Axis:   -10 Text Interpretation:  Sinus rhythm Prolonged PR interval Anterior infarct,  old Prolonged QT interval Confirmed by Ayuub Penley MD, Schelly Chuba [phone]) on  02/28/2015 9:32:29 AM       Lyndal Pulley, MD 02/28/15 6045

## 2015-02-28 NOTE — Progress Notes (Addendum)
ANTICOAGULATION CONSULT NOTE - Initial Consult  Pharmacy Consult for heparin Indication: chest pain/ACS/NSTEMI  No Known Allergies  Patient Measurements: Height:  (165.1 cm) Weight: 143 lb (64.864 kg) IBW/kg (Calculated) : 57 Heparin Dosing Weight: 64.9kg  Vital Signs: Temp: 99.8 F (37.7 C) (09/13 0841) Temp Source: Rectal (09/13 0841) BP: 133/64 mmHg (09/13 1100) Pulse Rate: 63 (09/13 1100)  Labs:  Recent Labs  02/28/15 0925  HGB 10.9*  HCT 32.6*  PLT 140*  CREATININE 0.88    Estimated Creatinine Clearance: 35.2 mL/min (by C-G formula based on Cr of 0.88).   Medical History: Past Medical History  Diagnosis Date  . Cardiac pacemaker in situ   . Dizziness and giddiness   . Symptomatic bradycardia     a. Near syncope s/p Medtronic pacemaker 2008.  . Iron deficiency anemia, unspecified   . Hypothyroidism   . HTN (hypertension)   . Pure hypercholesterolemia   . Chronic diastolic CHF (congestive heart failure)   . Complication of anesthesia     ANXIETY  . Pacemaker   . Osteoarthrosis, unspecified whether generalized or localized, unspecified site     OA  . Confusion Adm - 04/27/2013  . TIA (transient ischemic attack)   . Overactive bladder   . History of CVA (cerebrovascular accident)   . Multiple falls   . Dementia      Assessment: 66 yof presenting with AMS, Fever, Trop+, symptoms concerning for NSTEMI. Pharmacy consulted to dose heparin (no anticoag pta). Cards to see patient. Hg 10.9, plt 140 on admit. No bleed documented.  Goal of Therapy:  Heparin level 0.3-0.7 units/ml Monitor platelets by anticoagulation protocol: Yes   Plan:  Heparin @ 800 units/h (no bolus) 8h HL Daily HL/CBC Mon s/sx bleeding  Babs Bertin, PharmD Clinical Pharmacist Pager [phone] 02/28/2015 11:42 AM  ______________________________ Addendum:  Initial HL therapeutic at 0.3. Continue current dose and will re-check HL with AM labs  Isaac Bliss, PharmD,  [facility] Clinical Pharmacist Pager [phone] 02/28/2015 9:18 PM

## 2015-02-28 NOTE — ED Notes (Signed)
MD at bedside. 

## 2015-02-28 NOTE — ED Notes (Signed)
[facility]- pt coming from home. Caregiver reports she is not acting herself. Pt began bactrim for UTI yesterday. Pt feels warm to touch. Vitals stable.

## 2015-02-28 NOTE — H&P (Signed)
[facility] History and Physical  [name] ZOX:[id number] DOB: [birth year] DOA: 02/28/2015  Referring physician: Dr Lowella Bandy PCP: Ailene Ravel, MD   Chief Complaint: SOB  HPI: [name] is a 81 y.o. female  With history of sinus node dysfunction/symptomatic bradycardia status post pacemaker placement 2008, hypertension, hypothyroidism, hyperlipidemia, chronic diastolic heart failure, history of CVA, overactive bladder presenting from home with her family secondary to worsening shortness of breath. Patient does have a history of dementia and a such most of the history was obtained from patient's daughter and granddaughter. Per daughter and granddaughter patient has been having some more labored breathing and has worsened over the past 3 days. Patient saw PCP one day prior to admission was diagnosed with a UTI and started on Bactrim. Patient has only had 1 dose of Bactrim. Family stating that patient is having increased urinary output with decreased oral intake. Family reports patient with some generalized weakness. Deny any fevers, no chills, no wheezing, no nausea, no vomiting, no weight gain, no chest pain, no lower extremity edema, no paroxysmal nocturnal dyspnea, no orthopnea, no dysuria, no melanotic, no hematemesis, no hematochezia. Family also states patient has had some nonproductive cough for several months. Patient also with some shallow breathing. Patient was noted to be hypoxic on admission with sats of 79% and placed on oxygen with sats coming up to the mid 90s. Patient was seen in the ED chest x-ray that was negative for any acute infiltrate. Urinalysis with trace leukocytes negative nitrite 7-10 WBCs. Comprehensive metabolic profile with a sodium of 131 potassium of 3.3 chloride of 97 glucose of 112. Abdomen of 2.9. Protein of 5.3. BNP of 539.6. Point-of-care troponin at 1.78. Lactic acid 1.08. CBC had a hemoglobin of 10.9 platelet count of 140 otherwise was within  normal limits. EKG with normal sinus rhythm with poor R-wave progression. No ischemic changes noted. Patient was given IV Rocephin. Patient placed on IV heparin. [facility] were called to admit the patient for further evaluation and management.   Review of Systems:  As per history of present illness otherwise negative. Constitutional:  No weight loss, night sweats, Fevers, chills, fatigue.  HEENT:  No headaches, Difficulty swallowing,Tooth/dental problems,Sore throat,  No sneezing, itching, ear ache, nasal congestion, post nasal drip,  Cardio-vascular:  No chest pain, Orthopnea, PND, swelling in lower extremities, anasarca, dizziness, palpitations  GI:  No heartburn, indigestion, abdominal pain, nausea, vomiting, diarrhea, change in bowel habits, loss of appetite  Resp:  No shortness of breath with exertion or at rest. No excess mucus, no productive cough, No non-productive cough, No coughing up of blood.No change in color of mucus.No wheezing.No chest wall deformity  Skin:  no rash or lesions.  GU:  no dysuria, change in color of urine, no urgency or frequency. No flank pain.  Musculoskeletal:  No joint pain or swelling. No decreased range of motion. No back pain.  Psych:  No change in mood or affect. No depression or anxiety. No memory loss.   Past Medical History  Diagnosis Date  . Cardiac pacemaker in situ   . Dizziness and giddiness   . Symptomatic bradycardia     a. Near syncope s/p Medtronic pacemaker 2008.  . Iron deficiency anemia, unspecified   . Hypothyroidism   . HTN (hypertension)   . Pure hypercholesterolemia   . Chronic diastolic CHF (congestive heart failure)   . Complication of anesthesia     ANXIETY  . Pacemaker   . Osteoarthrosis, unspecified whether generalized  or localized, unspecified site     OA  . Confusion Adm - 04/27/2013  . TIA (transient ischemic attack)   . Overactive bladder   . History of CVA (cerebrovascular accident)   . Multiple  falls   . Dementia    Past Surgical History  Procedure Laterality Date  . Pacemaker insertion  01/05/07    Medtronic, dual chamber. Dr. Lewayne Bunting MD.  . Bilateral total hip arthoplasties    . Insert / replace / remove pacemaker    . Total knee arthroplasty Bilateral   . Cataract extraction, bilateral    . Abdominal hysterectomy     Social History:  reports that she has never smoked. She has never used smokeless tobacco. She reports that she does not drink alcohol or use illicit drugs.  No Known Allergies  Family History  Problem Relation Age of Onset  . Coronary artery disease Other   . Stroke Other   . COPD Other   . Tuberculosis Mother    patient's father died from gangrene age unknown.  Prior to Admission medications   Medication Sig Start Date End Date Taking? Authorizing Provider  amLODipine (NORVASC) 5 MG tablet Take 5 mg by mouth daily.   Yes Historical Provider, MD  atenolol (TENORMIN) 50 MG tablet Take 25 mg by mouth daily as needed (if blood pressure is abover 160/100).   Yes Historical Provider, MD  CALCIUM PO Take 1 tablet by mouth daily.   Yes Historical Provider, MD  citalopram (CELEXA) 20 MG tablet Take 20 mg by mouth daily.     Yes Historical Provider, MD  dipyridamole-aspirin (AGGRENOX) 25-200 MG per 12 hr capsule Take 1 capsule by mouth 2 (two) times daily.     Yes Historical Provider, MD  fish oil-omega-3 fatty acids 1000 MG capsule Take 1 g by mouth daily.    Yes Historical Provider, MD  furosemide (LASIX) 20 MG tablet Take 20 mg by mouth daily as needed for fluid (take with potassium).    Yes Historical Provider, MD  gabapentin (NEURONTIN) 100 MG capsule Take 100 mg by mouth 2 (two) times daily.    Yes Historical Provider, MD  levothyroxine (SYNTHROID, LEVOTHROID) 100 MCG tablet Take 100 mcg by mouth daily before breakfast.   Yes Historical Provider, MD  Multiple Vitamin (MULTIVITAMIN) tablet Take 1 tablet by mouth daily.     Yes Historical Provider, MD    nitroGLYCERIN (NITROSTAT) 0.4 MG SL tablet Place 1 tablet (0.4 mg total) under the tongue every 5 (five) minutes as needed for chest pain. 06/01/14  Yes Roxy Horseman, PA-C  oxyCODONE-acetaminophen (PERCOCET) 10-325 MG per tablet Take 0.5 tablets by mouth every 8 (eight) hours as needed for pain. Patient taking differently: Take 0.5 tablets by mouth 2 (two) times daily.  04/28/13  Yes Elease Etienne, MD  pravastatin (PRAVACHOL) 40 MG tablet Take 40 mg by mouth at bedtime.     Yes Historical Provider, MD  vitamin B-12 (CYANOCOBALAMIN) 1000 MCG tablet Take 1,000 mcg by mouth daily.   Yes Historical Provider, MD  traZODone (DESYREL) 100 MG tablet Take 0.5 tablets (50 mg total) by mouth at bedtime as needed for sleep. 04/28/13   Elease Etienne, MD   Physical Exam: Filed Vitals:   02/28/15 1015 02/28/15 1030 02/28/15 1045 02/28/15 1100  BP: 107/60 145/78 130/53 133/64  Pulse: 64 72 66 63  Temp:      TempSrc:      Resp: 20 26 24 21   Height:  Weight:      SpO2: 99% 97% 97% 100%    Wt Readings from Last 3 Encounters:  02/28/15 64.864 kg (143 lb)  08/16/14 64.919 kg (143 lb 1.9 oz)  06/13/14 64.864 kg (143 lb)    General:  Well-developed well-nourished laying on gurney in no acute cardiopulmonary distress.  Eyes: PERRLA, EOMI, normal lids, irises & conjunctiva ENT: grossly normal hearing, lips & tongue Neck: no LAD, masses or thyromegaly Cardiovascular: RRR, no m/r/g. No LE edema. Telemetry: SR, no arrhythmias  Respiratory: CTA bilaterally, no w/r/r. Normal respiratory effort. Abdomen: soft, ntnd, positive bowel sounds, no rebound, no guarding Skin: no rash or induration seen on limited exam Musculoskeletal: grossly normal tone BUE/BLE Psychiatric: grossly normal mood and affect, speech fluent and appropriate Neurologic: Alert and oriented 3. Cranial nerves II through XII grossly intact. No focal deficits.           Labs on Admission:  Basic Metabolic Panel:  Recent  Labs Lab 02/28/15 0925  NA 131*  K 3.3*  CL 97*  CO2 27  GLUCOSE 112*  BUN 15  CREATININE 0.88  CALCIUM 8.3*   Liver Function Tests:  Recent Labs Lab 02/28/15 0925  AST 30  ALT 14  ALKPHOS 62  BILITOT 0.6  PROT 5.3*  ALBUMIN 2.9*   No results for input(s): LIPASE, AMYLASE in the last 168 hours. No results for input(s): AMMONIA in the last 168 hours. CBC:  Recent Labs Lab 02/28/15 0925  WBC 8.9  NEUTROABS 7.4  HGB 10.9*  HCT 32.6*  MCV 97.3  PLT 140*   Cardiac Enzymes: No results for input(s): CKTOTAL, CKMB, CKMBINDEX, TROPONINI in the last 168 hours.  BNP (last 3 results)  Recent Labs  02/28/15 0925  BNP 539.6*    ProBNP (last 3 results)  Recent Labs  06/01/14 0954  PROBNP 513.2*    CBG: No results for input(s): GLUCAP in the last 168 hours.  Radiological Exams on Admission: Dg Chest 2 View  02/28/2015   CLINICAL DATA:  Altered mental status.  Dementia.  EXAM: CHEST  2 VIEW  COMPARISON:  06/01/2014.  FINDINGS: Cardiopericardial silhouette is enlarged. Tortuous thoracic aorta with arch atherosclerosis. Unchanged LEFT subclavian cardiac pacemaker. No airspace disease. No pleural effusion. Thoracic spondylosis and kyphosis.  IMPRESSION: Cardiomegaly without failure.  No acute cardiopulmonary disease.   Electronically Signed   By: Andreas Newport M.D.   On: 02/28/2015 10:01    EKG: Independently reviewed. Normal sinus rhythm. Poor R-wave progression. Prolonged PR interval of 283. First-degree heart block. No ischemic changes noted.  Assessment/Plan Principal Problem:   NSTEMI (non-ST elevated myocardial infarction) Active Problems:   Hypothyroidism   HYPERCHOLESTEROLEMIA   Iron deficiency anemia   Essential hypertension   PACEMAKER, PERMANENT   H/O: CVA (cerebrovascular accident)   Dementia   Sinus node dysfunction s/p Medtronic pacemaker 2008   Chronic diastolic CHF (congestive heart failure)   UTI (lower urinary tract infection)   SOB  (shortness of breath)   Hypokalemia  #1 non-STEMI Questionable etiology. Patient with elevated troponin. Patient had presented with worsening generalized weakness as well as shortness of breath. Chest x-ray negative for any acute infiltrate and negative for volume overload. Patient looks euvolemic on examination. BNP elevated in the 500s. Will admit patient to telemetry. EKG with normal sinus rhythm, poor R-wave progression, prolonged PR interval of 283. Cycle cardiac enzymes every 6 hours 3. Check a TSH. Check a d-dimer. Check a fasting lipid panel. Check a 2-D echo. Continue  home dose beta blocker, Aggrenox. Patient was given an aspirin in the ED. Continue IV heparin. Continue home dose statin. Consult with cardiology for further evaluation and management.  #2 hypothyroidism Check a TSH. Continue home dose Synthroid.  #3 shortness of breath Likely secondary to problem #1. Patient also had complaints of generalized weakness. Patient with elevated troponin. BNP was also elevated however patient looks euvolemic on examination. Will check a d-dimer. Cycle cardiac enzymes every 6 hours 3. Check a 2-D echo. Continue oxygen, IV heparin, supportive care. Follow.  #4 hypertension Stable. Continue beta blocker.  #5 sinus node dysfunction/symptomatic bradycardia status post PPM 2008 Patient was recently interrogated pacemaker 02/15/2015.  #6 UTI Patient diagnosed with UTI per PCP one day prior to admission and had only taken 1 dose of Bactrim. Urine cultures have been ordered and are pending. Place on IV Rocephin.  #7 hypokalemia Likely secondary to diuretics. Replete.  #8 chronic diastolic heart failure Stable. Patient looks euvolemic on examination. Continue home dose beta blocker, statin, Aggrenox, Lasix.  #9 history of CVA Stable. Continue Aggrenox for secondary stroke prevention.  #10 iron deficiency anemia Follow H&H.  #11 hyperlipidemia Check a fasting lipid panel. Continue  statin.  #12 prophylaxis Heparin for DVT prophylaxis.  Code Status: Full DVT Prophylaxis: Heparin Family Communication: Updated patient daughter, granddaughter at bedside. Disposition Plan: admit to telemetry  Time spent: 40 MINS  [facility] MD [facility] Pager [phone]

## 2015-02-28 NOTE — ED Notes (Signed)
Critical troponin reported to Dr. Clydene Pugh.

## 2015-02-28 NOTE — ED Notes (Signed)
Cardiology at bedside.

## 2015-02-28 NOTE — ED Provider Notes (Signed)
I saw and evaluated the patient, reviewed the resident's note and I agree with the findings and plan. Please see associated encounter note.   EKG Interpretation   Date/Time:  Tuesday February 28 2015 09:22:22 EDT Ventricular Rate:  70 PR Interval:  283 QRS Duration: 99 QT Interval:  470 QTC Calculation: 507 R Axis:   -10 Text Interpretation:  Sinus rhythm Prolonged PR interval Anterior infarct,  old Prolonged QT interval Confirmed by KNOTT MD, DANIEL [phone]) on  02/28/2015 9:32:29 AM      Emergency Focused Ultrasound Exam Limited Ultrasound of the Heart and Pericardium  Performed and interpreted by Dr. Clydene Pugh Indication: shortness of breath Multiple views of the heart, pericardium, and IVC are obtained with a multi frequency probe.  Findings: decreased contractility, no anechoic fluid, 50% IVC collapse Interpretation: mildly depressed ejection fraction, no pericardial effusion, no depressed CVP Images archived electronically.  CPT Code: 60454  CRITICAL CARE Performed by: Lyndal Pulley Total critical care time: 30 Critical care time was exclusive of separately billable procedures and treating other patients. Critical care was necessary to treat or prevent imminent or life-threatening deterioration. Critical care was time spent personally by me on the following activities: development of treatment plan with patient and/or surrogate as well as nursing, discussions with consultants, evaluation of patient's response to treatment, examination of patient, obtaining history from patient or surrogate, ordering and performing treatments and interventions, ordering and review of laboratory studies, ordering and review of radiographic studies, pulse oximetry and re-evaluation of patient's condition.   81 year old feel presents with gradual onset worsening shortness of breath last night. She is being currently treated for UTI. She is mildly febrile, ill-appearing on exam with no apparent  distress or dyspnea. Daughter is relating history of prior "fluid around the heart" unclear if this means pericardial effusion or congestive heart failure exacerbation. Will rule out for sepsis, optimize antibiotics as patient has been on Bactrim, and reassess after cardiac and infectious workup. Vital signs stable. Troponin elevation concerning for NSTEMI, given aspirin and heparinized, will require admission.  Lyndal Pulley, MD 02/28/15 [phone]

## 2015-02-28 NOTE — ED Notes (Signed)
Lab results reported to Dr.Knott.

## 2015-03-01 ENCOUNTER — Inpatient Hospital Stay (HOSPITAL_COMMUNITY): Payer: Medicare Other

## 2015-03-01 ENCOUNTER — Encounter: Payer: Self-pay | Admitting: Cardiology

## 2015-03-01 DIAGNOSIS — Z950 Presence of cardiac pacemaker: Secondary | ICD-10-CM | POA: Diagnosis not present

## 2015-03-01 DIAGNOSIS — I5042 Chronic combined systolic (congestive) and diastolic (congestive) heart failure: Secondary | ICD-10-CM | POA: Diagnosis not present

## 2015-03-01 DIAGNOSIS — Z96653 Presence of artificial knee joint, bilateral: Secondary | ICD-10-CM | POA: Diagnosis not present

## 2015-03-01 DIAGNOSIS — I214 Non-ST elevation (NSTEMI) myocardial infarction: Secondary | ICD-10-CM | POA: Diagnosis not present

## 2015-03-01 DIAGNOSIS — R0600 Dyspnea, unspecified: Secondary | ICD-10-CM

## 2015-03-01 DIAGNOSIS — N390 Urinary tract infection, site not specified: Secondary | ICD-10-CM | POA: Diagnosis not present

## 2015-03-01 DIAGNOSIS — Z8673 Personal history of transient ischemic attack (TIA), and cerebral infarction without residual deficits: Secondary | ICD-10-CM | POA: Diagnosis not present

## 2015-03-01 DIAGNOSIS — E039 Hypothyroidism, unspecified: Secondary | ICD-10-CM | POA: Diagnosis not present

## 2015-03-01 DIAGNOSIS — I10 Essential (primary) hypertension: Secondary | ICD-10-CM | POA: Diagnosis not present

## 2015-03-01 DIAGNOSIS — E876 Hypokalemia: Secondary | ICD-10-CM

## 2015-03-01 DIAGNOSIS — I5032 Chronic diastolic (congestive) heart failure: Secondary | ICD-10-CM

## 2015-03-01 DIAGNOSIS — E780 Pure hypercholesterolemia: Secondary | ICD-10-CM | POA: Diagnosis not present

## 2015-03-01 DIAGNOSIS — F0390 Unspecified dementia without behavioral disturbance: Secondary | ICD-10-CM | POA: Diagnosis not present

## 2015-03-01 DIAGNOSIS — R0902 Hypoxemia: Secondary | ICD-10-CM | POA: Diagnosis not present

## 2015-03-01 DIAGNOSIS — D509 Iron deficiency anemia, unspecified: Secondary | ICD-10-CM | POA: Diagnosis not present

## 2015-03-01 DIAGNOSIS — Z23 Encounter for immunization: Secondary | ICD-10-CM | POA: Diagnosis not present

## 2015-03-01 DIAGNOSIS — I248 Other forms of acute ischemic heart disease: Secondary | ICD-10-CM | POA: Diagnosis not present

## 2015-03-01 DIAGNOSIS — T502X5A Adverse effect of carbonic-anhydrase inhibitors, benzothiadiazides and other diuretics, initial encounter: Secondary | ICD-10-CM | POA: Diagnosis not present

## 2015-03-01 DIAGNOSIS — E785 Hyperlipidemia, unspecified: Secondary | ICD-10-CM | POA: Diagnosis not present

## 2015-03-01 DIAGNOSIS — B9620 Unspecified Escherichia coli [E. coli] as the cause of diseases classified elsewhere: Secondary | ICD-10-CM | POA: Diagnosis not present

## 2015-03-01 DIAGNOSIS — F0391 Unspecified dementia with behavioral disturbance: Secondary | ICD-10-CM

## 2015-03-01 LAB — LIPID PANEL
CHOLESTEROL: 118 mg/dL (ref 0–200)
HDL: 41 mg/dL (ref >40)
LDL Cholesterol: 69 mg/dL (ref 0–99)
TRIGLYCERIDES: 42 mg/dL (ref <150)
Total CHOL/HDL Ratio: 2.9 RATIO
VLDL: 8 mg/dL (ref 0–40)

## 2015-03-01 LAB — PROTIME-INR
INR: 1.26 (ref 0.00–1.49)
Prothrombin Time: 16 seconds — ABNORMAL HIGH (ref 11.6–15.2)

## 2015-03-01 LAB — CBC
HEMATOCRIT: 29.9 % — AB (ref 36.0–46.0)
Hemoglobin: 10.1 g/dL — ABNORMAL LOW (ref 12.0–15.0)
MCH: 33 pg (ref 26.0–34.0)
MCHC: 33.8 g/dL (ref 30.0–36.0)
MCV: 97.7 fL (ref 78.0–100.0)
Platelets: 127 10*3/uL — ABNORMAL LOW (ref 150–400)
RBC: 3.06 MIL/uL — ABNORMAL LOW (ref 3.87–5.11)
RDW: 12.9 % (ref 11.5–15.5)
WBC: 6.1 10*3/uL (ref 4.0–10.5)

## 2015-03-01 LAB — BASIC METABOLIC PANEL
ANION GAP: 8 (ref 5–15)
BUN: 15 mg/dL (ref 6–20)
CALCIUM: 7.9 mg/dL — AB (ref 8.9–10.3)
CO2: 24 mmol/L (ref 22–32)
CREATININE: 0.86 mg/dL (ref 0.44–1.00)
Chloride: 99 mmol/L — ABNORMAL LOW (ref 101–111)
GFR, EST NON AFRICAN AMERICAN: 56 mL/min — AB (ref >60)
Glucose, Bld: 109 mg/dL — ABNORMAL HIGH (ref 65–99)
Potassium: 3.6 mmol/L (ref 3.5–5.1)
SODIUM: 131 mmol/L — AB (ref 135–145)

## 2015-03-01 LAB — TROPONIN I: TROPONIN I: 1.09 ng/mL — AB (ref <0.031)

## 2015-03-01 LAB — HEPARIN LEVEL (UNFRACTIONATED)
HEPARIN UNFRACTIONATED: 0.22 [IU]/mL — AB (ref 0.30–0.70)
Heparin Unfractionated: 0.5 IU/mL (ref 0.30–0.70)

## 2015-03-01 LAB — MAGNESIUM: MAGNESIUM: 2.7 mg/dL — AB (ref 1.7–2.4)

## 2015-03-01 MED ORDER — LISINOPRIL 2.5 MG PO TABS: 2.5000 mg | ORAL_TABLET | Freq: Every day | ORAL | Status: DC

## 2015-03-01 MED ORDER — LISINOPRIL 5 MG PO TABS
5.0000 mg | ORAL_TABLET | Freq: Every day | ORAL | Status: DC
  Administered 2015-03-02: 5 mg via ORAL
  Filled 2015-03-01: qty 1

## 2015-03-01 MED ORDER — METOPROLOL TARTRATE 12.5 MG HALF TABLET
12.5000 mg | ORAL_TABLET | Freq: Two times a day (BID) | ORAL | Status: DC
  Administered 2015-03-01 – 2015-03-02 (×3): 12.5 mg via ORAL
  Filled 2015-03-01 (×3): qty 1

## 2015-03-01 MED ORDER — CLOPIDOGREL BISULFATE 75 MG PO TABS
75.0000 mg | ORAL_TABLET | Freq: Every day | ORAL | Status: DC
  Administered 2015-03-01 – 2015-03-02 (×2): 75 mg via ORAL
  Filled 2015-03-01 (×2): qty 1

## 2015-03-01 MED ORDER — ASPIRIN 81 MG PO CHEW
81.0000 mg | CHEWABLE_TABLET | Freq: Every day | ORAL | Status: DC
  Administered 2015-03-02: 81 mg via ORAL
  Filled 2015-03-01: qty 1

## 2015-03-01 NOTE — Progress Notes (Addendum)
[facility] PROGRESS NOTE  TRISHA KEN ZOX:[id number] DOB: [birth year] DOA: 02/28/2015 PCP: Ailene Ravel, MD  Assessment/Plan: #1 non-STEMI -demand ischemia vs ACS -family has discussed with cardiology service and given no further CP or SOB currently will focus on medical management -will stop aggrenox and use plavix instead -will continue ASA and statins -start metoprolol and low dose lisinopril -will continue heparin drip for 48 hours while inpatient -PT/OT to evaluate activity capacity   #2 hypothyroidism -Continue home dose Synthroid.  #3 shortness of breath: patient denies SOB today 9/14 -Likely secondary to problem #1. Patient also had complaints of generalized weakness. Patient with elevated troponin.  -BNP was also elevated however patient looks euvolemic on examination. Will check a d-dimer. -cardiology on board and helping with medication adjustment  #4 hypertension -overall stable and with fair control. Will monitor  #5 sinus node dysfunction/symptomatic bradycardia status post PPM 2008 -Patient had a recent interrogation of pacemaker 02/15/2015; appears to be working as intended -will follow HR as per cardiology rec's will start metoprolol  #6 E. COli UTI -Patient diagnosed with UTI per PCP one day prior to admission -will continue rocephin -outpatient cx demonstrated that microorganism is sensitive to rocephin -continue supportive care  #7 hypokalemia -Likely secondary to diuretics.  -will monitor and Replete as needed  #8 chronic diastolic heart failure -compensated -Patient looks euvolemic on examination. Continue home dose beta blocker, statin, plavix now and Lasix.  #9 history of CVA -no new neurologic deficit -will use plavix and ASA 81mg  for secondary prevention  #10 iron deficiency anemia -no signs of overt bleeding -will follow Hgb trend  #11 hyperlipidemia -Will Continue statin.  #12 dementia -will provide supportive care and  constant reorientation  #13 chronic combined CHF: EF 45% 50%; poor quality study; positive concerns for septal hypokinesis and inferior wall as well. -will use metoprolol and lisinopril has been adeded  Code Status: Full Family Communication: daughter at bedside Disposition Plan: to be determine; remains inpatient   Consultants:  Cardiology   Procedures:  2-D echo: - Left ventricle: Poor image quality septal hypokineis ? inferior wall as well. The cavity size was mildly dilated. Wall thickness was increased in a pattern of mild LVH. There was mild focal basal hypertrophy of the septum. Systolic function was mildly reduced. The estimated ejection fraction was in the range of 45% to 50%. - Left atrium: The atrium was mildly dilated. - Pulmonary arteries: PA peak pressure: 32 mm Hg (S).  Antibiotics:  Rocephin 9/13  HPI/Subjective: Elderly woman, in no distress, laying on bed. Denies CP and endorses no SOB  Objective: Filed Vitals:   03/01/15 2102  BP: 139/67  Pulse: 62  Temp: 99.5 F (37.5 C)  Resp: 18    Intake/Output Summary (Last 24 hours) at 03/01/15 2146 Last data filed at 03/01/15 1300  Gross per 24 hour  Intake    243 ml  Output    870 ml  Net   -627 ml   Filed Weights   02/28/15 0841 02/28/15 1642 03/01/15 0642  Weight: 64.864 kg (143 lb) 66.9 kg (147 lb 7.8 oz) 65.499 kg (144 lb 6.4 oz)    Exam:   General:  Afebrile, pleasantly confused and denying CP or SOB  Cardiovascular: no rubs or gallops, regular rate  Respiratory: CTA bilaterally  Abdomen: soft, NT, positive BS  Musculoskeletal: no edema, cyanosis or clubbing    Data Reviewed: Basic Metabolic Panel:  Recent Labs Lab 02/28/15 0925 02/28/15 1830 03/01/15 0020  NA  131*  --  131*  K 3.3*  --  3.6  CL 97*  --  99*  CO2 27  --  24  GLUCOSE 112*  --  109*  BUN 15  --  15  CREATININE 0.88  --  0.86  CALCIUM 8.3*  --  7.9*  MG  --  1.5* 2.7*   Liver Function  Tests:  Recent Labs Lab 02/28/15 0925  AST 30  ALT 14  ALKPHOS 62  BILITOT 0.6  PROT 5.3*  ALBUMIN 2.9*   CBC:  Recent Labs Lab 02/28/15 0925 03/01/15 0020  WBC 8.9 6.1  NEUTROABS 7.4  --   HGB 10.9* 10.1*  HCT 32.6* 29.9*  MCV 97.3 97.7  PLT 140* 127*   Cardiac Enzymes:  Recent Labs Lab 02/28/15 1328 02/28/15 1830 03/01/15 0020  TROPONINI 1.60* 1.32* 1.09*   BNP (last 3 results)  Recent Labs  02/28/15 0925  BNP 539.6*    ProBNP (last 3 results)  Recent Labs  06/01/14 0954  PROBNP 513.2*    Recent Results (from the past 240 hour(s))  Urine culture     Status: None (Preliminary result)   Collection Time: 02/28/15 10:36 AM  Result Value Ref Range Status   Specimen Description URINE, RANDOM  Final   Special Requests NONE  Final   Culture NO GROWTH < 12 HOURS  Final   Report Status PENDING  Incomplete     Studies: Dg Chest 2 View  02/28/2015   CLINICAL DATA:  Altered mental status.  Dementia.  EXAM: CHEST  2 VIEW  COMPARISON:  06/01/2014.  FINDINGS: Cardiopericardial silhouette is enlarged. Tortuous thoracic aorta with arch atherosclerosis. Unchanged LEFT subclavian cardiac pacemaker. No airspace disease. No pleural effusion. Thoracic spondylosis and kyphosis.  IMPRESSION: Cardiomegaly without failure.  No acute cardiopulmonary disease.   Electronically Signed   By: Andreas Newport M.D.   On: 02/28/2015 10:01   Ct Angio Chest Pe W/cm &/or Wo Cm  02/28/2015   CLINICAL DATA:  81 year old female with shortness of breath  EXAM: CT ANGIOGRAPHY CHEST WITH CONTRAST  TECHNIQUE: Multidetector CT imaging of the chest was performed using the standard protocol during bolus administration of intravenous contrast. Multiplanar CT image reconstructions and MIPs were obtained to evaluate the vascular anatomy.  CONTRAST:  OMNIPAQUE IOHEXOL 350 MG/ML SOLN  COMPARISON:  Radiograph dated 03/02/2015  FINDINGS: Evaluation is limited due to streak artifact caused by left  pectoral pacemaker device. Evaluation is also limited due to respiratory motion artifact.  There are emphysematous changes of the lungs. There is no focal consolidation, pleural effusion, or pneumothorax. Bilateral posterior pleural thickening noted. The central airways are patent.  Atherosclerotic calcification of the thoracic aorta. There is mild prominence of the main pulmonary trunk indicative of a degree of pulmonary hypertension. Clinical correlation is recommended. No CT evidence of pulmonary embolism.  There is no hilar or mediastinal adenopathy cardiomegaly. No pericardial effusion. Esophagus is grossly unremarkable there is no axillary adenopathy. The chest wall soft tissues appear unremarkable. Left pectoral pacemaker device noted with there is degenerative changes of the spine. There are degenerative changes of the shoulders. Old right posterior eighth rib fracture.  The visualized upper abdomen appears unremarkable.  Review of the MIP images confirms the above findings.  IMPRESSION: No CT evidence of pulmonary embolism.   Electronically Signed   By: Elgie Collard M.D.   On: 02/28/2015 23:01    Scheduled Meds: . [START ON 03/02/2015] aspirin  81 mg Oral  Daily  . cefTRIAXone (ROCEPHIN)  IV  1 g Intravenous Q24H  . citalopram  20 mg Oral Daily  . clopidogrel  75 mg Oral Daily  . furosemide  20 mg Oral Daily  . gabapentin  100 mg Oral BID  . levothyroxine  100 mcg Oral QAC breakfast  . [START ON 03/02/2015] lisinopril  5 mg Oral Daily  . metoprolol tartrate  12.5 mg Oral BID  . multivitamin with minerals  1 tablet Oral Daily  . omega-3 acid ethyl esters  1 g Oral Daily  . oxyCODONE-acetaminophen  1 tablet Oral BID  . pravastatin  40 mg Oral QHS  . sodium chloride  3 mL Intravenous Q12H  . vitamin B-12  1,000 mcg Oral Daily   Continuous Infusions: . heparin 900 Units/hr (03/01/15 1615)    Principal Problem:   NSTEMI (non-ST elevated myocardial infarction) Active Problems:    Hypothyroidism   HYPERCHOLESTEROLEMIA   Iron deficiency anemia   Essential hypertension   PACEMAKER, PERMANENT   H/O: CVA (cerebrovascular accident)   Dementia   Sinus node dysfunction s/p Medtronic pacemaker 2008   Chronic diastolic CHF (congestive heart failure)   UTI (lower urinary tract infection)   SOB (shortness of breath)   Hypokalemia    Time spent: 30 minutes    Vassie Loll  [facility] Pager [phone]. If 7PM-7AM, please contact night-coverage at [email or web address], password [facility] 03/01/2015, 9:46 PM  LOS: 1 day

## 2015-03-01 NOTE — Progress Notes (Signed)
Patient Name: [name] Date of Encounter: 03/01/2015   SUBJECTIVE  Feeling well. No chest pain, sob or palpitations. Wants to go home.   CURRENT MEDS . aspirin EC  325 mg Oral Once  . cefTRIAXone (ROCEPHIN)  IV  1 g Intravenous Q24H  . citalopram  20 mg Oral Daily  . dipyridamole-aspirin  1 capsule Oral BID  . furosemide  20 mg Oral Daily  . gabapentin  100 mg Oral BID  . Influenza vac split quadrivalent PF  0.5 mL Intramuscular Tomorrow-1000  . levothyroxine  100 mcg Oral QAC breakfast  . multivitamin with minerals  1 tablet Oral Daily  . omega-3 acid ethyl esters  1 g Oral Daily  . oxyCODONE-acetaminophen  1 tablet Oral BID  . pravastatin  40 mg Oral QHS  . sodium chloride  3 mL Intravenous Q12H  . vitamin B-12  1,000 mcg Oral Daily    OBJECTIVE  Filed Vitals:   02/28/15 1500 02/28/15 1642 02/28/15 1939 03/01/15 0642  BP: 119/61 145/66 159/78 155/77  Pulse: 59 70 72 84  Temp:  98.8 F (37.1 C) 98.4 F (36.9 C) 99.2 F (37.3 C)  TempSrc:  Oral Oral Oral  Resp: 20 18 18 18   Height:  5\' 2"  (1.575 m)    Weight:  147 lb 7.8 oz (66.9 kg)  144 lb 6.4 oz (65.499 kg)  SpO2: 100%  99% 95%    Intake/Output Summary (Last 24 hours) at 03/01/15 0923 Last data filed at 03/01/15 0900  Gross per 24 hour  Intake    243 ml  Output   1495 ml  Net  -1252 ml   Filed Weights   02/28/15 0841 02/28/15 1642 03/01/15 0642  Weight: 143 lb (64.864 kg) 147 lb 7.8 oz (66.9 kg) 144 lb 6.4 oz (65.499 kg)    PHYSICAL EXAM  General: Pleasant, NAD. Neuro: Alert and oriented X 3. Moves all extremities spontaneously. Psych: Normal affect. HEENT:  Normal  Neck: Supple without bruits or JVD. Lungs:  Resp regular and unlabored, CTA. Heart: RRR no s3, s4, or murmurs. Abdomen: Soft, non-tender, non-distended, BS + x 4.  Extremities: No clubbing, cyanosis or edema. DP/PT/Radials 2+ and equal bilaterally. No calf tenderness.   Accessory Clinical Findings  CBC  Recent Labs  02/28/15 0925 03/01/15 0020  WBC 8.9 6.1  NEUTROABS 7.4  --   HGB 10.9* 10.1*  HCT 32.6* 29.9*  MCV 97.3 97.7  PLT 140* 127*   Basic Metabolic Panel  Recent Labs  02/28/15 0925 02/28/15 1830 03/01/15 0020  NA 131*  --  131*  K 3.3*  --  3.6  CL 97*  --  99*  CO2 27  --  24  GLUCOSE 112*  --  109*  BUN 15  --  15  CREATININE 0.88  --  0.86  CALCIUM 8.3*  --  7.9*  MG  --  1.5* 2.7*   Liver Function Tests  Recent Labs  02/28/15 0925  AST 30  ALT 14  ALKPHOS 62  BILITOT 0.6  PROT 5.3*  ALBUMIN 2.9*   No results for input(s): LIPASE, AMYLASE in the last 72 hours. Cardiac Enzymes  Recent Labs  02/28/15 1328 02/28/15 1830 03/01/15 0020  TROPONINI 1.60* 1.32* 1.09*   BNP Invalid input(s): POCBNP D-Dimer  Recent Labs  02/28/15 1328  DDIMER 1.59*   Hemoglobin A1C No results for input(s): HGBA1C in the last 72 hours. Fasting Lipid Panel  Recent Labs  03/01/15 0020  CHOL 118  HDL 41  LDLCALC 69  TRIG 42  CHOLHDL 2.9   Thyroid Function Tests  Recent Labs  02/28/15 1328  TSH 1.112    TELE  Sinus rhythm with 1st degree block and paced rhytm  Radiology/Studies  Dg Chest 2 View  02/28/2015   CLINICAL DATA:  Altered mental status.  Dementia.  EXAM: CHEST  2 VIEW  COMPARISON:  06/01/2014.  FINDINGS: Cardiopericardial silhouette is enlarged. Tortuous thoracic aorta with arch atherosclerosis. Unchanged LEFT subclavian cardiac pacemaker. No airspace disease. No pleural effusion. Thoracic spondylosis and kyphosis.  IMPRESSION: Cardiomegaly without failure.  No acute cardiopulmonary disease.   Electronically Signed   By: Andreas Newport M.D.   On: 02/28/2015 10:01   Ct Angio Chest Pe W/cm &/or Wo Cm  02/28/2015   CLINICAL DATA:  81 year old female with shortness of breath  EXAM: CT ANGIOGRAPHY CHEST WITH CONTRAST  TECHNIQUE: Multidetector CT imaging of the chest was performed using the standard protocol during bolus administration of intravenous  contrast. Multiplanar CT image reconstructions and MIPs were obtained to evaluate the vascular anatomy.  CONTRAST:  OMNIPAQUE IOHEXOL 350 MG/ML SOLN  COMPARISON:  Radiograph dated 03/02/2015  FINDINGS: Evaluation is limited due to streak artifact caused by left pectoral pacemaker device. Evaluation is also limited due to respiratory motion artifact.  There are emphysematous changes of the lungs. There is no focal consolidation, pleural effusion, or pneumothorax. Bilateral posterior pleural thickening noted. The central airways are patent.  Atherosclerotic calcification of the thoracic aorta. There is mild prominence of the main pulmonary trunk indicative of a degree of pulmonary hypertension. Clinical correlation is recommended. No CT evidence of pulmonary embolism.  There is no hilar or mediastinal adenopathy cardiomegaly. No pericardial effusion. Esophagus is grossly unremarkable there is no axillary adenopathy. The chest wall soft tissues appear unremarkable. Left pectoral pacemaker device noted with there is degenerative changes of the spine. There are degenerative changes of the shoulders. Old right posterior eighth rib fracture.  The visualized upper abdomen appears unremarkable.  Review of the MIP images confirms the above findings.  IMPRESSION: No CT evidence of pulmonary embolism.   Electronically Signed   By: Elgie Collard M.D.   On: 02/28/2015 23:01    ASSESSMENT AND PLAN    1. Dyspnea: 3 day history of progressive dyspnea, hypoxia and elevated d-dimer of 1.59. CT angio negative for PE. No calf tenderness. Her renal function is normal.  2. Elevated Troponin/NSTEMI: initial troponin is 1.60--> now trending down to 1.09. No classic anginal symptoms. CT angio negative for PE. - Given her age, cath would not be a best option. Had long discussion with daughter at bedside. She will discuss with her brother for further intervention and let us know. Pending 2D echo to assess LVF and wall motion.  If w/u points to an ischemic etiology, it will likely be best to treat medically given her advanced age and dementia.  - Patient had eaten today. .   - EKG this morning showed paced rhythm, no ischemia.  -Will continue heparin.   3. HTN: BP up this morning. Continue to follow.    4. HLD: on statin therapy with pravastatin.   5. UTI: management per primary.   6. Prolonged QTc: 507 ms on EKG. She is hypokalemic with K of 3.3, Supplement given, K of 3.6 this morning. Mg of 1.5 yesterday, given supplement today 2.7. Continue to Monitor  7. PPM: [employer] device. Patient had a remote download 02/27/15. Report: Impedance, sensing,  auto capture thresholds consistent with previous measurements. Histograms appropriate for patient and level of activity. All other diagnostic data reviewed and is appropriate and stable for patient. Real time/magnet EGM shows appropriate sensing and capture. (2) mode switches (<0.1%)---both <30 sec, no EGMs. No ventricular high rate episodes. Estimated longevity 25 months (range: 11-38 months).  8. Chronic Diastolic CHF: BNP abnormal at 161 but CXR is w/o edema and she appears euvolemic on exam.    Signed, Ivoree Felmlee PA-C

## 2015-03-01 NOTE — Progress Notes (Signed)
ANTICOAGULATION CONSULT NOTE  Pharmacy Consult for heparin Indication: chest pain/ACS/NSTEMI  No Known Allergies  Patient Measurements: Height:  (157.5 cm) Weight: 144 lb 6.4 oz (65.499 kg) IBW/kg (Calculated) : 50.1 Heparin Dosing Weight: 64.9kg  Vital Signs: Temp: 99.2 F (37.3 C) (09/14 0642) Temp Source: Oral (09/14 0642) BP: 146/82 mmHg (09/14 1013) Pulse Rate: 84 (09/14 0642)  Labs:  Recent Labs  02/28/15 0925 02/28/15 1328 02/28/15 1830 02/28/15 2021 03/01/15 0020 03/01/15 0930  HGB 10.9*  --   --   --  10.1*  --   HCT 32.6*  --   --   --  29.9*  --   PLT 140*  --   --   --  127*  --   LABPROT  --   --   --   --  16.0*  --   INR  --   --   --   --  1.26  --   HEPARINUNFRC  --   --   --  0.30 0.22* 0.50  CREATININE 0.88  --   --   --  0.86  --   TROPONINI  --  1.60* 1.32*  --  1.09*  --     Estimated Creatinine Clearance: 35.6 mL/min (by C-G formula based on Cr of 0.86).   Assessment: 42 yof presenting with AMS, Fever, Trop+, symptoms concerning for NSTEMI. Pharmacy consulted to dose heparin (no anticoag pta).  Heparin level now therapeutic  Goal of Therapy:  Heparin level 0.3-0.7 units/ml Monitor platelets by anticoagulation protocol: Yes   Plan:  Continue heparin at 900 units / hr Daily HL/CBC Mon s/sx bleeding  Thank you Okey Regal, PharmD [phone]  03/01/2015 10:56 AM

## 2015-03-01 NOTE — Care Management Note (Signed)
Case Management Note  Patient Details  Name: [name] MRN: [id number] Date of Birth: 1918/09/06  Subjective/Objective:  Pt admitted for SOB. Increased troponin. Positive for UTI. Initiated on IV Rocephin.                  Action/Plan: PT/ OT to be consulted for recommendations. CM will continue to monitor.    Expected Discharge Date:                  Expected Discharge Plan:  Home w Home Health Services  In-House Referral:     Discharge planning Services  CM Consult  Post Acute Care Choice:    Choice offered to:     DME Arranged:    DME Agency:     HH Arranged:    HH Agency:     Status of Service:  In process, will continue to follow  Medicare Important Message Given:    Date Medicare IM Given:    Medicare IM give by:    Date Additional Medicare IM Given:    Additional Medicare Important Message give by:     If discussed at Long Length of Stay Meetings, dates discussed:    Additional Comments:  Gala Lewandowsky, RN 03/01/2015, 10:35 AM

## 2015-03-01 NOTE — Progress Notes (Signed)
*  PRELIMINARY RESULTS* Echocardiogram 2D Echocardiogram has been performed.  [name] 03/01/2015, 11:46 AM

## 2015-03-01 NOTE — Progress Notes (Signed)
UR Completed Remmy Riffe Graves-Bigelow, RN,BSN [phone]  

## 2015-03-01 NOTE — Progress Notes (Signed)
ANTICOAGULATION CONSULT NOTE - Follow Up Consult  Pharmacy Consult for heparin Indication: r.o ACS   Labs:  Recent Labs  02/28/15 0925 02/28/15 1328 02/28/15 1830 02/28/15 2021 03/01/15 0020  HGB 10.9*  --   --   --  10.1*  HCT 32.6*  --   --   --  29.9*  PLT 140*  --   --   --  127*  LABPROT  --   --   --   --  16.0*  INR  --   --   --   --  1.26  HEPARINUNFRC  --   --   --  0.30 0.22*  CREATININE 0.88  --   --   --  0.86  TROPONINI  --  1.60* 1.32*  --  1.09*     Assessment: 81yo female now subtherapeutic on heparin after one level at low end of goal.  Goal of Therapy:  Heparin level 0.3-0.7 units/ml   Plan:  Will increase heparin gtt by ~2 units/kg/hr to 900 units/hr and check level in 8hr.  Vernard Gambles, PharmD, BCPS  03/01/2015,2:35 AM

## 2015-03-02 DIAGNOSIS — R0602 Shortness of breath: Secondary | ICD-10-CM

## 2015-03-02 DIAGNOSIS — I5042 Chronic combined systolic (congestive) and diastolic (congestive) heart failure: Secondary | ICD-10-CM

## 2015-03-02 DIAGNOSIS — F0390 Unspecified dementia without behavioral disturbance: Secondary | ICD-10-CM

## 2015-03-02 LAB — CBC
HEMATOCRIT: 30.9 % — AB (ref 36.0–46.0)
HEMOGLOBIN: 10 g/dL — AB (ref 12.0–15.0)
MCH: 31.6 pg (ref 26.0–34.0)
MCHC: 32.4 g/dL (ref 30.0–36.0)
MCV: 97.8 fL (ref 78.0–100.0)
Platelets: 151 10*3/uL (ref 150–400)
RBC: 3.16 MIL/uL — AB (ref 3.87–5.11)
RDW: 13 % (ref 11.5–15.5)
WBC: 6.2 10*3/uL (ref 4.0–10.5)

## 2015-03-02 LAB — BASIC METABOLIC PANEL
Anion gap: 6 (ref 5–15)
BUN: 13 mg/dL (ref 6–20)
CHLORIDE: 96 mmol/L — AB (ref 101–111)
CO2: 28 mmol/L (ref 22–32)
CREATININE: 0.84 mg/dL (ref 0.44–1.00)
Calcium: 7.7 mg/dL — ABNORMAL LOW (ref 8.9–10.3)
GFR calc Af Amer: >60 mL/min (ref >60)
GFR calc non Af Amer: 58 mL/min — ABNORMAL LOW (ref >60)
Glucose, Bld: 103 mg/dL — ABNORMAL HIGH (ref 65–99)
POTASSIUM: 3.7 mmol/L (ref 3.5–5.1)
Sodium: 130 mmol/L — ABNORMAL LOW (ref 135–145)

## 2015-03-02 LAB — URINE CULTURE

## 2015-03-02 LAB — HEPARIN LEVEL (UNFRACTIONATED): Heparin Unfractionated: 0.48 IU/mL (ref 0.30–0.70)

## 2015-03-02 MED ORDER — LISINOPRIL 5 MG PO TABS: 5.0000 mg | ORAL_TABLET | Freq: Every day | ORAL | Status: DC

## 2015-03-02 MED ORDER — OXYCODONE-ACETAMINOPHEN 5-325 MG PO TABS: 1.0000 | ORAL_TABLET | Freq: Three times a day (TID) | ORAL | Status: DC | PRN

## 2015-03-02 MED ORDER — METOPROLOL TARTRATE 25 MG/10 ML ORAL SUSPENSION
6.2500 mg | Freq: Two times a day (BID) | ORAL | Status: DC
Start: 2015-03-02 — End: 2015-03-02
  Filled 2015-03-02: qty 2.5

## 2015-03-02 MED ORDER — CLOPIDOGREL BISULFATE 75 MG PO TABS: 75.0000 mg | ORAL_TABLET | Freq: Every day | ORAL | Status: DC

## 2015-03-02 MED ORDER — METOPROLOL TARTRATE 25 MG PO TABS: 12.5000 mg | ORAL_TABLET | Freq: Two times a day (BID) | ORAL | Status: DC

## 2015-03-02 MED ORDER — METOPROLOL TARTRATE 12.5 MG HALF TABLET: 6.2500 mg | ORAL_TABLET | Freq: Two times a day (BID) | ORAL | Status: DC

## 2015-03-02 MED ORDER — FUROSEMIDE 20 MG PO TABS: 20.0000 mg | ORAL_TABLET | Freq: Every day | ORAL | Status: DC

## 2015-03-02 MED ORDER — ASPIRIN 81 MG PO CHEW: 81.0000 mg | CHEWABLE_TABLET | Freq: Every day | ORAL | Status: AC

## 2015-03-02 NOTE — Evaluation (Signed)
Physical Therapy Evaluation Patient Details Name: [name] MRN: [id number] DOB: 1918/11/08 Today's Date: 03/02/2015   History of Present Illness  81 y.o. female admitted to [facility] on 02/28/15 for SOB.  Dx with NSTEMI.  Pt with significant PMHx of bil TKA, bil THA, bil shoulders and elbow arthritis, dizziness, symptomatic bradycardia s/p PPM, HTN, CHF, confusion/dementia, TIA, h/o multiple falls, poor vision (bil cataract surgeries and very thick glasses).    Clinical Impression  Pt is upset that this is her first day she has been able to get up to walk.  "you get weak when you can't walk and are in the bed all day!"  Per family report she is a little more wobbly looking than usual and has a history of falls.  I recommended that her caregivers give her physical assistance for a week or so when she is up and moving around.  HEP provided of LE and UE exercises and RN CM informed of HHPT recommendation.   PT to follow acutely for deficits listed below.     Follow Up Recommendations Home health PT;Supervision/Assistance - 24 hour    Equipment Recommendations  None recommended by PT    Recommendations for Other Services   NA    Precautions / Restrictions Precautions Precautions: Fall Precaution Comments: per granddaughter's report she has fallen recently.       Mobility  Bed Mobility Overal bed mobility: Needs Assistance Bed Mobility: Supine to Sit;Sit to Supine     Supine to sit: Min guard;HOB elevated Sit to supine: Supervision   General bed mobility comments: Min guard assist to prevent posterior LOB while scooting to EOB.  Pt using bed rail to pull up to sitting for leverage.  Pt able to get both legs back into the bed with bed rail for leverage unassisted.   Transfers Overall transfer level: Needs assistance Equipment used: 1 person hand held assist Transfers: Sit to/from Stand Sit to Stand: Min assist         General transfer comment: Min assist to steady pt for  balance.  Verbal cues for controlled descent to sit, with fatigue she tends to crash down.  Ambulation/Gait Ambulation/Gait assistance: Min assist Ambulation Distance (Feet): 65 Feet Assistive device: 1 person hand held assist (hallway railing. ) Gait Pattern/deviations: Step-through pattern;Staggering left;Staggering right;Shuffle Gait velocity: decreased   General Gait Details: Pt with difficult time finding her way to the bathroom/out of the door.  Not sure if this is related to her vision.  Lights on in room.  Shuffling, staggering gait pattern with narrow BOS.  Pt reaching for every stability surface she can in the room and hallway.  Granddaughter reports that she has very bad arthritis in her shoulders and elbows and that is why she has not used a RW for fear that it would case pain to the arms.       Balance Overall balance assessment: Needs assistance Sitting-balance support: Feet unsupported;No upper extremity supported;Bilateral upper extremity supported Sitting balance-Leahy Scale: Fair Sitting balance - Comments: Poor to fair.  With feet unsupported poor due to posterior preference, with feet supported fair.  I did no challenge her sitting balance to see if she could rate good.  Postural control: Posterior lean Standing balance support: Single extremity supported;Bilateral upper extremity supported Standing balance-Leahy Scale: Poor Standing balance comment: pt needs external support for balance in standing.  Pertinent Vitals/Pain Pain Assessment: No/denies pain    Home Living Family/patient expects to be discharged to:: Private residence Living Arrangements: Alone Available Help at Discharge: Personal care attendant;Available 24 hours/day Type of Home: House Home Access: Stairs to enter Entrance Stairs-Rails: None Entrance Stairs-Number of Steps: 2 Home Layout: Two level;Full bath on main level (pt doesn't go upstairs) Home  Equipment: Walker - 2 wheels;Cane - single point;Shower seat;Grab bars - tub/shower;Bedside commode;Hospital bed Additional Comments: Pt is widowed    Prior Function Level of Independence: Needs assistance   Gait / Transfers Assistance Needed: pt uses furniture  ADL's / Homemaking Assistance Needed: pt reports normally she does it herself  Comments: wears very thick glasses vs contacts at home, still seems to not see well with her glasses on during PT session     Hand Dominance   Dominant Hand: Right    Extremity/Trunk Assessment   Upper Extremity Assessment: Defer to OT evaluation           Lower Extremity Assessment: Generalized weakness      Cervical / Trunk Assessment: Kyphotic  Communication   Communication: HOH  Cognition Arousal/Alertness: Awake/alert Behavior During Therapy: WFL for tasks assessed/performed Overall Cognitive Status: History of cognitive impairments - at baseline Area of Impairment: Orientation;Memory Orientation Level: Disoriented to;Time ("March" "Tuesday")   Memory: Decreased short-term memory              General Comments General comments (skin integrity, edema, etc.): Per granddaughter who is present for session, she seems a little more off balance than usual, a little weaker on her feet.     Exercises General Exercises - Upper Extremity Elbow Flexion: AAROM;Both;10 reps;Seated Elbow Extension: AAROM;Both;10 reps;Seated General Exercises - Lower Extremity Long Arc Quad: AROM;Both;10 reps;Seated Hip ABduction/ADduction: AROM;Both;10 reps;Seated Hip Flexion/Marching: AROM;Both;10 reps;Seated Toe Raises: AROM;Both;10 reps;Seated Heel Raises: AROM;Both;10 reps;Seated      Assessment/Plan    PT Assessment Patient needs continued PT services  PT Diagnosis Difficulty walking;Abnormality of gait;Generalized weakness   PT Problem List Decreased strength;Decreased activity tolerance;Decreased balance;Decreased coordination;Decreased  mobility;Decreased knowledge of use of DME;Decreased cognition  PT Treatment Interventions DME instruction;Gait training;Stair training;Functional mobility training;Therapeutic activities;Therapeutic exercise;Balance training;Neuromuscular re-education;Patient/family education;Cognitive remediation   PT Goals (Current goals can be found in the Care Plan section) Acute Rehab PT Goals Patient Stated Goal: to go home PT Goal Formulation: With patient/family Time For Goal Achievement: 03/16/15 Potential to Achieve Goals: Good    Frequency Min 3X/week    End of Session Equipment Utilized During Treatment: Gait belt Activity Tolerance: Patient tolerated treatment well Patient left: in bed;with call bell/phone within reach;with family/visitor present Nurse Communication: Mobility status         Time: 1027-1103 PT Time Calculation (min) (ACUTE ONLY): 36 min   Charges:   PT Evaluation $Initial PT Evaluation Tier I: 1 Procedure PT Treatments $Gait Training: 8-22 mins        Monika Chestang B. Ulrich Soules, PT, DPT [phone]   03/02/2015, 11:20 AM

## 2015-03-02 NOTE — Discharge Summary (Signed)
Physician Discharge Summary  [name] Selle ZOX:[id number] DOB: 01/27/1919 DOA: 02/28/2015  PCP: Ailene Ravel, MD  Admit date: 02/28/2015 Discharge date: 03/02/2015  Time spent: 40 minutes  Recommendations for Outpatient Follow-up:  1. Repeat BMET to follow electrolytes and renal function 2. Reassess BP and adjust medications as needed 3. Please clean medication list further as family feel she is on to much medications, especially PRN meds that she is actively taking all the time (list partially cleaned base on PE findings, PMH needs and family preference)  Discharge Diagnoses:  Principal Problem:   NSTEMI (non-ST elevated myocardial infarction) Active Problems:   Hypothyroidism   HYPERCHOLESTEROLEMIA   Iron deficiency anemia   Essential hypertension   PACEMAKER, PERMANENT   H/O: CVA (cerebrovascular accident)   Dementia   Sinus node dysfunction s/p Medtronic pacemaker 2008   Chronic diastolic CHF (congestive heart failure)   UTI (lower urinary tract infection)   SOB (shortness of breath)   Hypokalemia   Chronic combined systolic and diastolic CHF (congestive heart failure)   Discharge Condition: stable and improved. Discharge home with [facility] services.  Diet recommendation: low sodium diet   Filed Weights   02/28/15 1642 03/01/15 0642 03/02/15 0557  Weight: 66.9 kg (147 lb 7.8 oz) 65.499 kg (144 lb 6.4 oz) 66.497 kg (146 lb 9.6 oz)    History of present illness:  81 y.o. female With history of sinus node dysfunction/symptomatic bradycardia status post pacemaker placement 2008, hypertension, hypothyroidism, hyperlipidemia, chronic diastolic heart failure, history of CVA, overactive bladder presenting from home with her family secondary to worsening shortness of breath. Patient does have a history of dementia and a such most of the history was obtained from patient's daughter and granddaughter. Per daughter and granddaughter patient has been having some more labored breathing  and has worsened over the past 3 days. Patient saw PCP one day prior to admission was diagnosed with a UTI and started on Bactrim. Patient has only had 1 dose of Bactrim. Family stating that patient is having increased urinary output with decreased oral intake. Family reports patient with some generalized weakness. Deny any fevers, no chills, no wheezing, no nausea, no vomiting, no weight gain, no chest pain, no lower extremity edema, no paroxysmal nocturnal dyspnea, no orthopnea, no dysuria, no melanotic, no hematemesis, no hematochezia. Family also states patient has had some nonproductive cough for several months. Patient also with some shallow breathing. Patient was noted to be hypoxic on admission with sats of 79% and placed on oxygen with sats coming up to the mid 90s.  Hospital Course:  #1 NSTEMI -demand ischemia vs ACS -family has discussed with cardiology service and given no further CP or SOB currently will focus on medical management -will stop aggrenox and use plavix instead -will continue ASA and statins -started on metoprolol and low dose lisinopril -she received 48 hours of heparin drip while inpatient -PT/OT to evaluated activity and ADL's capacity and recommended HH services   #2 hypothyroidism -Continue home dose Synthroid.  #3 shortness of breath: patient denies SOB at discharge -Likely secondary to problem #1. Patient also has complaints of generalized weakness. Patient with elevated troponin.  -BNP was also elevated however patient looks euvolemic on examination.  -given decrease on her EF found on 2-D echo, cardiology recommended low dose lasix on daily basis  #4 hypertension -overall stable and with fair control. -will recommend heart healthy diet -will discharge on metoprolol, lisinopril and lasix  #5 sinus node dysfunction/symptomatic bradycardia status post PPM 2008 -  Patient had a recent interrogation of pacemaker 02/15/2015; appears to be working as  intended -will follow HR as she is now on metoprolol  -norvasc discontinue   #6 E. COli UTI -Patient diagnosed with UTI by her PCP one day prior to admission -was started on Bactrim  -will resume and complete antibiotics as prescribed, minus days (she received 2 days worth of treatment while inpatient) -advise to maintain adequate hydration   #7 hypokalemia -Likely secondary to diuretics.  -will monitor  -at discharge K 3.7  #8 chronic combined CHF: EF 45% 50%; poor quality study; positive concerns for septal hypokinesis and inferior wall as well. -will use metoprolol and lisinopril  -patient advise to follow heart healthy diet and to check weight on daily basis -per cardiology has been discharge on lasix 20mg  daily until follow up with them; at that time will assess needs for long term daily diuretics base on volume status   #9 history of CVA -no new neurologic deficit -will use plavix and ASA 81mg  for secondary prevention  #10 iron deficiency anemia -no signs of overt bleeding -will recommend follow up of Hgb trend  #11 hyperlipidemia -Will Continue statin.  #12 dementia -will provide supportive care and constant reorientation -discharge home with family care    Procedures:  2-D echo: - Left ventricle: Poor image quality septal hypokineis ? inferior wall as well. The cavity size was mildly dilated. Wall thickness was increased in a pattern of mild LVH. There was mild focal basal hypertrophy of the septum. Systolic function was mildly reduced. The estimated ejection fraction was in the range of 45% to 50%. - Left atrium: The atrium was mildly dilated. - Pulmonary arteries: PA peak pressure: 32 mm Hg (S).  Consultations:  Cardiology   Discharge Exam: Filed Vitals:   03/02/15 1050  BP:   Pulse: 83  Temp:   Resp:     General: Afebrile, pleasantly confused and denying CP or SOB  Cardiovascular: no rubs or gallops, regular rate  Respiratory:  CTA bilaterally  Abdomen: soft, NT, positive BS  Musculoskeletal: trace edema bilaterally, cyanosis or clubbing  Discharge Instructions   Discharge Instructions    Diet - low sodium heart healthy    Complete by:  As directed      Discharge instructions    Complete by:  As directed   Take medications as prescribed Arrange Follow up with PCP in 10 days Please follow with Cardiology service as instructed Check weight on daily basis and follow low sodium diet Complete treatment with Bactrim as instructed minus two days (she received 2 days treatment of abx's while in the hospital)          Current Discharge Medication List    START taking these medications   Details  aspirin 81 MG chewable tablet Chew 1 tablet (81 mg total) by mouth daily.    clopidogrel (PLAVIX) 75 MG tablet Take 1 tablet (75 mg total) by mouth daily. Qty: 30 tablet, Refills: 1    lisinopril (PRINIVIL,ZESTRIL) 5 MG tablet Take 1 tablet (5 mg total) by mouth daily. Qty: 30 tablet, Refills: 1    metoprolol tartrate (LOPRESSOR) 25 MG tablet Take 0.5 tablets (12.5 mg total) by mouth 2 (two) times daily. Qty: 30 tablet, Refills: 1    oxyCODONE-acetaminophen (PERCOCET/ROXICET) 5-325 MG per tablet Take 1 tablet by mouth every 8 (eight) hours as needed for severe pain. Qty: 30 tablet, Refills: 0      CONTINUE these medications which have CHANGED  Details  furosemide (LASIX) 20 MG tablet Take 1 tablet (20 mg total) by mouth daily. Qty: 30 tablet, Refills: 1      CONTINUE these medications which have NOT CHANGED   Details  citalopram (CELEXA) 20 MG tablet Take 20 mg by mouth daily.      fish oil-omega-3 fatty acids 1000 MG capsule Take 1 g by mouth daily.     levothyroxine (SYNTHROID, LEVOTHROID) 100 MCG tablet Take 100 mcg by mouth daily before breakfast.    Multiple Vitamin (MULTIVITAMIN) tablet Take 1 tablet by mouth daily.      nitroGLYCERIN (NITROSTAT) 0.4 MG SL tablet Place 1 tablet (0.4 mg total)  under the tongue every 5 (five) minutes as needed for chest pain. Qty: 30 tablet, Refills: 0    pravastatin (PRAVACHOL) 40 MG tablet Take 40 mg by mouth at bedtime.      vitamin B-12 (CYANOCOBALAMIN) 1000 MCG tablet Take 1,000 mcg by mouth daily.    traZODone (DESYREL) 100 MG tablet Take 0.5 tablets (50 mg total) by mouth at bedtime as needed for sleep.      STOP taking these medications     amLODipine (NORVASC) 5 MG tablet      atenolol (TENORMIN) 50 MG tablet      CALCIUM PO      dipyridamole-aspirin (AGGRENOX) 25-200 MG per 12 hr capsule      gabapentin (NEURONTIN) 100 MG capsule      oxyCODONE-acetaminophen (PERCOCET) 10-325 MG per tablet        No Known Allergies Follow-up Information    Follow up with Ronie Spies, PA-C On 03/10/2015.   Specialties:  Cardiology, Radiology   Why:  @ 11:00 for cardiology follow up   Contact information:   [address] [place] [address] [phone]       Follow up with [facility], MD. Schedule an appointment as soon as possible for a visit in 10 days.   Specialty:  Family Medicine   Contact information:   Dr. Burnell Blanks [address] [place] [address] [phone]       The results of significant diagnostics from this hospitalization (including imaging, microbiology, ancillary and laboratory) are listed below for reference.    Significant Diagnostic Studies: Dg Chest 2 View  02/28/2015   CLINICAL DATA:  Altered mental status.  Dementia.  EXAM: CHEST  2 VIEW  COMPARISON:  06/01/2014.  FINDINGS: Cardiopericardial silhouette is enlarged. Tortuous thoracic aorta with arch atherosclerosis. Unchanged LEFT subclavian cardiac pacemaker. No airspace disease. No pleural effusion. Thoracic spondylosis and kyphosis.  IMPRESSION: Cardiomegaly without failure.  No acute cardiopulmonary disease.   Electronically Signed   By: Andreas Newport M.D.   On: 02/28/2015 10:01   Ct Angio Chest Pe W/cm &/or  Wo Cm  02/28/2015   CLINICAL DATA:  81 year old female with shortness of breath  EXAM: CT ANGIOGRAPHY CHEST WITH CONTRAST  TECHNIQUE: Multidetector CT imaging of the chest was performed using the standard protocol during bolus administration of intravenous contrast. Multiplanar CT image reconstructions and MIPs were obtained to evaluate the vascular anatomy.  CONTRAST:  OMNIPAQUE IOHEXOL 350 MG/ML SOLN  COMPARISON:  Radiograph dated 03/02/2015  FINDINGS: Evaluation is limited due to streak artifact caused by left pectoral pacemaker device. Evaluation is also limited due to respiratory motion artifact.  There are emphysematous changes of the lungs. There is no focal consolidation, pleural effusion, or pneumothorax. Bilateral posterior pleural thickening noted. The central airways are patent.  Atherosclerotic  calcification of the thoracic aorta. There is mild prominence of the main pulmonary trunk indicative of a degree of pulmonary hypertension. Clinical correlation is recommended. No CT evidence of pulmonary embolism.  There is no hilar or mediastinal adenopathy cardiomegaly. No pericardial effusion. Esophagus is grossly unremarkable there is no axillary adenopathy. The chest wall soft tissues appear unremarkable. Left pectoral pacemaker device noted with there is degenerative changes of the spine. There are degenerative changes of the shoulders. Old right posterior eighth rib fracture.  The visualized upper abdomen appears unremarkable.  Review of the MIP images confirms the above findings.  IMPRESSION: No CT evidence of pulmonary embolism.   Electronically Signed   By: Elgie Collard M.D.   On: 02/28/2015 23:01    Microbiology: Recent Results (from the past 240 hour(s))  Urine culture     Status: None   Collection Time: 02/28/15 10:36 AM  Result Value Ref Range Status   Specimen Description URINE, RANDOM  Final   Special Requests NONE  Final   Culture MULTIPLE SPECIES PRESENT, SUGGEST  RECOLLECTION  Final   Report Status 03/02/2015 FINAL  Final     Labs: Basic Metabolic Panel:  Recent Labs Lab 02/28/15 0925 02/28/15 1830 03/01/15 0020 03/02/15 0308  NA 131*  --  131* 130*  K 3.3*  --  3.6 3.7  CL 97*  --  99* 96*  CO2 27  --  24 28  GLUCOSE 112*  --  109* 103*  BUN 15  --  15 13  CREATININE 0.88  --  0.86 0.84  CALCIUM 8.3*  --  7.9* 7.7*  MG  --  1.5* 2.7*  --    Liver Function Tests:  Recent Labs Lab 02/28/15 0925  AST 30  ALT 14  ALKPHOS 62  BILITOT 0.6  PROT 5.3*  ALBUMIN 2.9*   CBC:  Recent Labs Lab 02/28/15 0925 03/01/15 0020 03/02/15 0308  WBC 8.9 6.1 6.2  NEUTROABS 7.4  --   --   HGB 10.9* 10.1* 10.0*  HCT 32.6* 29.9* 30.9*  MCV 97.3 97.7 97.8  PLT 140* 127* 151   Cardiac Enzymes:  Recent Labs Lab 02/28/15 1328 02/28/15 1830 03/01/15 0020  TROPONINI 1.60* 1.32* 1.09*   BNP: BNP (last 3 results)  Recent Labs  02/28/15 0925  BNP 539.6*    ProBNP (last 3 results)  Recent Labs  06/01/14 0954  PROBNP 513.2*    Signed:  Vassie Loll  [facility] 03/02/2015, 3:05 PM

## 2015-03-02 NOTE — Progress Notes (Signed)
Pt uses [employer].   [phone]

## 2015-03-02 NOTE — Care Management Note (Signed)
Case Management Note  Patient Details  Name: [name] MRN: [id number] Date of Birth: 04-24-1919  Subjective/Objective:   Pt admitted for Nstemi-Plan for home today.                  Action/Plan: CM did make referral for [facility] services. Pt and family agreeable to [facility] for [facility] Services- SOC to begin within 24-48 hrs post d/c. No DME needs at this time. OT to evaluate for [facility] services. No further needs from CM at this time.   Expected Discharge Date:                  Expected Discharge Plan:  Home w Home Health Services  In-House Referral:     Discharge planning Services  CM Consult  Post Acute Care Choice:  Home Health Choice offered to:  Adult Children  DME Arranged:  N/A DME Agency:  NA  HH Arranged:  PT, OT HH Agency:  NA  Status of Service:  Completed, signed off  Medicare Important Message Given:    Date Medicare IM Given:    Medicare IM give by:    Date Additional Medicare IM Given:    Additional Medicare Important Message give by:     If discussed at Long Length of Stay Meetings, dates discussed:    Additional Comments:  Gala Lewandowsky, RN 03/02/2015, 11:37 AM

## 2015-03-02 NOTE — Progress Notes (Signed)
ANTICOAGULATION CONSULT NOTE  Pharmacy Consult for Heparin Indication: chest pain/ACS/NSTEMI  No Known Allergies  Patient Measurements: Height:  (157.5 cm) Weight: 146 lb 9.6 oz (66.497 kg) IBW/kg (Calculated) : 50.1 Heparin Dosing Weight: 64.9kg  Vital Signs: Temp: 98 F (36.7 C) (09/15 0557) Temp Source: Oral (09/15 0557) BP: 145/74 mmHg (09/15 0557) Pulse Rate: 59 (09/15 0557)  Labs:  Recent Labs  02/28/15 0925 02/28/15 1328 02/28/15 1830  03/01/15 0020 03/01/15 0930 03/02/15 0308  HGB 10.9*  --   --   --  10.1*  --  10.0*  HCT 32.6*  --   --   --  29.9*  --  30.9*  PLT 140*  --   --   --  127*  --  151  LABPROT  --   --   --   --  16.0*  --   --   INR  --   --   --   --  1.26  --   --   HEPARINUNFRC  --   --   --   < > 0.22* 0.50 0.48  CREATININE 0.88  --   --   --  0.86  --  0.84  TROPONINI  --  1.60* 1.32*  --  1.09*  --   --   < > = values in this interval not displayed.  Estimated Creatinine Clearance: 36.7 mL/min (by C-G formula based on Cr of 0.84).   Assessment: 16 yof presenting with AMS, Fever, Trop+, symptoms concerning for NSTEMI - planning medical management Continues on heparin CBC stable Heparin level therapeutic  Goal of Therapy:  Heparin level 0.3-0.7 units/ml Monitor platelets by anticoagulation protocol: Yes   Plan:  Continue heparin at 900 units / hr Daily HL/CBC Mon s/sx bleeding  Thank you Okey Regal, PharmD [phone]  03/02/2015 10:19 AM

## 2015-03-02 NOTE — Progress Notes (Signed)
Pt discharged home with family, home health services to follow. Pt family verbalized understanding off all discharge instructions including medication changes.

## 2015-03-02 NOTE — Progress Notes (Signed)
Patient Name: [name] Date of Encounter: 03/02/2015   SUBJECTIVE  Feeling well. No chest pain, sob or palpitations. She was agitated last night after taking Trazodone. Per daughter, she has not taken lasix for more than 2 years. Pending PT/OT eval.    CURRENT MEDS . aspirin  81 mg Oral Daily  . cefTRIAXone (ROCEPHIN)  IV  1 g Intravenous Q24H  . citalopram  20 mg Oral Daily  . clopidogrel  75 mg Oral Daily  . furosemide  20 mg Oral Daily  . gabapentin  100 mg Oral BID  . levothyroxine  100 mcg Oral QAC breakfast  . lisinopril  5 mg Oral Daily  . metoprolol tartrate  12.5 mg Oral BID  . multivitamin with minerals  1 tablet Oral Daily  . omega-3 acid ethyl esters  1 g Oral Daily  . oxyCODONE-acetaminophen  1 tablet Oral BID  . pravastatin  40 mg Oral QHS  . sodium chloride  3 mL Intravenous Q12H  . vitamin B-12  1,000 mcg Oral Daily    OBJECTIVE  Filed Vitals:   03/01/15 1331 03/01/15 1413 03/01/15 2102 03/02/15 0557  BP: 113/56 108/55 139/67 145/74  Pulse: 68 55 62 59  Temp:  98.9 F (37.2 C) 99.5 F (37.5 C) 98 F (36.7 C)  TempSrc:  Oral Oral Oral  Resp:  Height:      Weight:    146 lb 9.6 oz (66.497 kg)  SpO2:  92% 95% 95%    Intake/Output Summary (Last 24 hours) at 03/02/15 0838 Last data filed at 03/02/15 0754  Gross per 24 hour  Intake      0 ml  Output    900 ml  Net   -900 ml   Filed Weights   02/28/15 1642 03/01/15 0642 03/02/15 0557  Weight: 147 lb 7.8 oz (66.9 kg) 144 lb 6.4 oz (65.499 kg) 146 lb 9.6 oz (66.497 kg)    PHYSICAL EXAM  General: Pleasant, NAD. Neuro: Alert and oriented X 3. Moves all extremities spontaneously. Psych: Normal affect. HEENT:  Normal  Neck: Supple without bruits or JVD. Lungs:  Resp regular and unlabored. Faint bibasilar crackles.  Heart: RRR no s3, s4, or murmurs. Abdomen: Soft, non-tender, non-distended, BS + x 4.  Extremities: No clubbing, cyanosis or edema. DP/PT/Radials 2+ and equal  bilaterally. No calf tenderness.   Accessory Clinical Findings  CBC  Recent Labs  02/28/15 0925 03/01/15 0020 03/02/15 0308  WBC 8.9 6.1 6.2  NEUTROABS 7.4  --   --   HGB 10.9* 10.1* 10.0*  HCT 32.6* 29.9* 30.9*  MCV 97.3 97.7 97.8  PLT 140* 127* 151   Basic Metabolic Panel  Recent Labs  02/28/15 1830 03/01/15 0020 03/02/15 0308  NA  --  131* 130*  K  --  3.6 3.7  CL  --  99* 96*  CO2  --  24 28  GLUCOSE  --  109* 103*  BUN  --  15 13  CREATININE  --  0.86 0.84  CALCIUM  --  7.9* 7.7*  MG 1.5* 2.7*  --    Liver Function Tests  Recent Labs  02/28/15 0925  AST 30  ALT 14  ALKPHOS 62  BILITOT 0.6  PROT 5.3*  ALBUMIN 2.9*   No results for input(s): LIPASE, AMYLASE in the last 72 hours. Cardiac Enzymes  Recent Labs  02/28/15 1328 02/28/15 1830 03/01/15 0020  TROPONINI 1.60* 1.32* 1.09*   BNP  Invalid input(s): POCBNP D-Dimer  Recent Labs  02/28/15 1328  DDIMER 1.59*   Hemoglobin A1C No results for input(s): HGBA1C in the last 72 hours. Fasting Lipid Panel  Recent Labs  03/01/15 0020  CHOL 118  HDL 41  LDLCALC 69  TRIG 42  CHOLHDL 2.9   Thyroid Function Tests  Recent Labs  02/28/15 1328  TSH 1.112    TELE  Sinus rhythm with 1st degree block and paced rhytm  Radiology/Studies  Dg Chest 2 View  02/28/2015   CLINICAL DATA:  Altered mental status.  Dementia.  EXAM: CHEST  2 VIEW  COMPARISON:  06/01/2014.  FINDINGS: Cardiopericardial silhouette is enlarged. Tortuous thoracic aorta with arch atherosclerosis. Unchanged LEFT subclavian cardiac pacemaker. No airspace disease. No pleural effusion. Thoracic spondylosis and kyphosis.  IMPRESSION: Cardiomegaly without failure.  No acute cardiopulmonary disease.   Electronically Signed   By: Andreas Newport M.D.   On: 02/28/2015 10:01   Ct Angio Chest Pe W/cm &/or Wo Cm  02/28/2015   CLINICAL DATA:  81 year old female with shortness of breath  EXAM: CT ANGIOGRAPHY CHEST WITH CONTRAST   TECHNIQUE: Multidetector CT imaging of the chest was performed using the standard protocol during bolus administration of intravenous contrast. Multiplanar CT image reconstructions and MIPs were obtained to evaluate the vascular anatomy.  CONTRAST:  OMNIPAQUE IOHEXOL 350 MG/ML SOLN  COMPARISON:  Radiograph dated 03/02/2015  FINDINGS: Evaluation is limited due to streak artifact caused by left pectoral pacemaker device. Evaluation is also limited due to respiratory motion artifact.  There are emphysematous changes of the lungs. There is no focal consolidation, pleural effusion, or pneumothorax. Bilateral posterior pleural thickening noted. The central airways are patent.  Atherosclerotic calcification of the thoracic aorta. There is mild prominence of the main pulmonary trunk indicative of a degree of pulmonary hypertension. Clinical correlation is recommended. No CT evidence of pulmonary embolism.  There is no hilar or mediastinal adenopathy cardiomegaly. No pericardial effusion. Esophagus is grossly unremarkable there is no axillary adenopathy. The chest wall soft tissues appear unremarkable. Left pectoral pacemaker device noted with there is degenerative changes of the spine. There are degenerative changes of the shoulders. Old right posterior eighth rib fracture.  The visualized upper abdomen appears unremarkable.  Review of the MIP images confirms the above findings.  IMPRESSION: No CT evidence of pulmonary embolism.   Electronically Signed   By: Elgie Collard M.D.   On: 02/28/2015 23:01   Echo 03/01/15 LV EF: 45% -  50%  ------------------------------------------------------------------- Indications:   Dyspnea 786.09.  ------------------------------------------------------------------- History:  PMH: SA Node Dysfunction, NSTEMI. Congestive heart failure. Risk factors: Hypertension. Diabetes mellitus.  ------------------------------------------------------------------- Study  Conclusions  - Left ventricle: Poor image quality septal hypokineis ? inferior wall as well. The cavity size was mildly dilated. Wall thickness was increased in a pattern of mild LVH. There was mild focal basal hypertrophy of the septum. Systolic function was mildly reduced. The estimated ejection fraction was in the range of 45% to 50%. - Left atrium: The atrium was mildly dilated. - Pulmonary arteries: PA peak pressure: 32 mm Hg (S).  ASSESSMENT AND PLAN    1. Dyspnea: 3 day history of progressive dyspnea, hypoxia and elevated d-dimer of 1.59. CT angio negative for PE. No calf tenderness. Her renal function is normal.  2. Elevated Troponin/NSTEMI: Peak of troponin was 1.60. No classic anginal symptoms. CT angio negative for PE. - Echo revealed LVEF 45-50% with possible inferior and septal hypokinesis. Plan for medical management.  -  Discontinued atenolol and started on metoprolol.  However, patient has prolonged PR interval on EKG this morning, with A paced rhythm with PVCs. Consider discontinue vs follow closely. Will discuss with MD.   - Continue heparin and lisinopril.  - Pending PT/OT.  3. HTN: BP improved. Continue to follow.  She is not on home dose of Norvasc.   4. HLD: on statin therapy with pravastatin.   5. UTI: management per primary.   6. Prolonged QTc: 540 ms on EKG on EKG this morning. K normal. Continue to Monitor  7. PPM: [employer] device. Patient had a remote download 02/27/15. Report: Impedance, sensing, auto capture thresholds consistent with previous measurements. Histograms appropriate for patient and level of activity. All other diagnostic data reviewed and is appropriate and stable for patient. Real time/magnet EGM shows appropriate sensing and capture. (2) mode switches (<0.1%)---both <30 sec, no EGMs. No ventricular high rate episodes. Estimated longevity 25 months (range: 11-38 months).  8. Chronic Diastolic CHF: BNP abnormal at 161 but CXR  is w/o edema and she appears euvolemic on exam. She has not taken lasix for more than 2 years. Consider discontinue.   9. Hx of CVA - continue ASA and plavix.   Signed, Marilee Ditommaso PA-C

## 2015-03-03 NOTE — Progress Notes (Signed)
Occupational Therapy Evaluation  Pt admitted with below problems.  She requires min A- min guard assist for ADLs.  Recommend HHOT and 24 hour supervision/assist.  She has excellent family support.   03/02/15 1300  OT Visit Information  Last OT Received On 03/03/15  Assistance Needed +1  History of Present Illness 81 y.o. female admitted to [facility] on 02/28/15 for SOB.  Dx with NSTEMI.  Pt with significant PMHx of bil TKA, bil THA, bil shoulders and elbow arthritis, dizziness, symptomatic bradycardia s/p PPM, HTN, CHF, confusion/dementia, TIA, h/o multiple falls, poor vision (bil cataract surgeries and very thick glasses).    Precautions  Precautions Fall  Precaution Comments per granddaughter's report she has fallen recently.   Home Living  Family/patient expects to be discharged to: Private residence  Living Arrangements Alone  Available Help at Discharge Personal care attendant;Available 24 hours/day  Type of Home House  Home Access Stairs to enter  Entrance Stairs-Number of Steps 2  Entrance Stairs-Rails None  Home Layout Two level;Full bath on main level  Bathroom Shower/Tub Tub/shower unit  Shower/tub characteristics Curtain  Bathroom Toilet Handicapped height  Bathroom Accessibility No  Home Equipment Walker - 2 wheels;Cane - single point;Shower seat;Grab bars - tub/shower;BSC;Hospital bed  Additional Comments Pt is widowed  Prior Function  Level of Independence Needs assistance  Gait / Transfers Assistance Needed pt uses furniture  ADL's / Homemaking Assistance Needed pt reports normally she does it herself  Communication / Swallowing Assistance Needed HOH  Communication  Communication HOH  Pain Assessment  Pain Assessment No/denies pain  Cognition  Arousal/Alertness Awake/alert  Behavior During Therapy WFL for tasks assessed/performed  Overall Cognitive Status History of cognitive impairments - at baseline  Upper Extremity Assessment  Upper Extremity Assessment Generalized  weakness  Lower Extremity Assessment  Lower Extremity Assessment Defer to PT evaluation  Cervical / Trunk Assessment  Cervical / Trunk Assessment Kyphotic  ADL  Overall ADL's  Needs assistance/impaired  Eating/Feeding Independent  Grooming Wash/dry hands;Wash/dry face;Brushing hair;Min guard;Standing  Upper Body Bathing Set up;Sitting  Lower Body Bathing Minimal assistance;Sit to/from stand  Upper Body Dressing  Set up;Sitting  Lower Body Dressing Minimal assistance;Sit to/from stand  Lower Body Dressing Details (indicate cue type and reason) increased time  Toilet Transfer Minimal assistance  Toilet Transfer Details (indicate cue type and reason) LOB ambulating into BR  Toileting- Clothing Manipulation and Hygiene Minimal assistance;Sit to/from stand  Functional mobility during ADLs Min guard;Minimal assistance  General ADL Comments Pt requires increased time, occasional LOB requiring min A to revoer.  Family very supportive  Vision- History  Baseline Vision/History Wears glasses  Wears Glasses At all times  Patient Visual Report No change from baseline  Vision- Assessment  Additional Comments Glasses very dirty and were cleaned improving abilty to locate items  Bed Mobility  Overal bed mobility Needs Assistance  Bed Mobility Supine to Sit;Sit to Supine  Supine to sit Min guard;HOB elevated  Sit to supine Supervision  Transfers  Overall transfer level Needs assistance  Equipment used Rolling walker (2 wheeled)  Transfers Sit to/from Stand;Stand Pivot Transfers  Sit to Stand Min guard  Stand pivot transfers Min assist  Balance  Overall balance assessment Needs assistance  Sitting-balance support Feet supported  Sitting balance-Leahy Scale Good  Sitting balance - Comments donned socks with min guard  Standing balance support During functional activity  Standing balance-Leahy Scale Poor  OT - End of Session  Equipment Utilized During Treatment Rolling walker  Activity  Tolerance   Patient tolerated treatment well  Patient left in bed;with call bell/phone within reach;with bed alarm set;with family/visitor present  Nurse Communication Mobility status  OT Assessment  OT Therapy Diagnosis  Generalized weakness;Cognitive deficits  OT Recommendation/Assessment All further OT needs can be met in the next venue of care  OT Problem List Decreased strength;Decreased activity tolerance;Impaired balance (sitting and/or standing)  OT Recommendation  Follow Up Recommendations Home health OT;Supervision/Assistance - 24 hour  OT Equipment None recommended by OT  Individuals Consulted  Consulted and Agree with Results and Recommendations Family member/caregiver;Patient  Family Member Consulted grand daughter  Acute Rehab OT Goals  Patient Stated Goal to go home  OT Goal Formulation All assessment and education complete, DC therapy  OT General Charges  $OT Visit 1 Procedure  OT Evaluation  $Initial OT Evaluation Tier I 1 Procedure  Written Expression  Dominant Hand Right   , OTR/L [phone]  

## 2015-03-04 DIAGNOSIS — F0390 Unspecified dementia without behavioral disturbance: Secondary | ICD-10-CM | POA: Diagnosis not present

## 2015-03-04 DIAGNOSIS — D509 Iron deficiency anemia, unspecified: Secondary | ICD-10-CM | POA: Diagnosis not present

## 2015-03-04 DIAGNOSIS — I5032 Chronic diastolic (congestive) heart failure: Secondary | ICD-10-CM | POA: Diagnosis not present

## 2015-03-04 DIAGNOSIS — Z8673 Personal history of transient ischemic attack (TIA), and cerebral infarction without residual deficits: Secondary | ICD-10-CM | POA: Diagnosis not present

## 2015-03-04 DIAGNOSIS — E039 Hypothyroidism, unspecified: Secondary | ICD-10-CM | POA: Diagnosis not present

## 2015-03-04 DIAGNOSIS — I10 Essential (primary) hypertension: Secondary | ICD-10-CM | POA: Diagnosis not present

## 2015-03-04 DIAGNOSIS — I222 Subsequent non-ST elevation (NSTEMI) myocardial infarction: Secondary | ICD-10-CM | POA: Diagnosis not present

## 2015-03-04 DIAGNOSIS — Z950 Presence of cardiac pacemaker: Secondary | ICD-10-CM | POA: Diagnosis not present

## 2015-03-04 DIAGNOSIS — Z87440 Personal history of urinary (tract) infections: Secondary | ICD-10-CM | POA: Diagnosis not present

## 2015-03-06 DIAGNOSIS — Z87440 Personal history of urinary (tract) infections: Secondary | ICD-10-CM | POA: Diagnosis not present

## 2015-03-06 DIAGNOSIS — F0390 Unspecified dementia without behavioral disturbance: Secondary | ICD-10-CM | POA: Diagnosis not present

## 2015-03-06 DIAGNOSIS — I222 Subsequent non-ST elevation (NSTEMI) myocardial infarction: Secondary | ICD-10-CM | POA: Diagnosis not present

## 2015-03-06 DIAGNOSIS — Z950 Presence of cardiac pacemaker: Secondary | ICD-10-CM | POA: Diagnosis not present

## 2015-03-06 DIAGNOSIS — E039 Hypothyroidism, unspecified: Secondary | ICD-10-CM | POA: Diagnosis not present

## 2015-03-06 DIAGNOSIS — D509 Iron deficiency anemia, unspecified: Secondary | ICD-10-CM | POA: Diagnosis not present

## 2015-03-06 DIAGNOSIS — Z8673 Personal history of transient ischemic attack (TIA), and cerebral infarction without residual deficits: Secondary | ICD-10-CM | POA: Diagnosis not present

## 2015-03-06 DIAGNOSIS — I5032 Chronic diastolic (congestive) heart failure: Secondary | ICD-10-CM | POA: Diagnosis not present

## 2015-03-06 DIAGNOSIS — I10 Essential (primary) hypertension: Secondary | ICD-10-CM | POA: Diagnosis not present

## 2015-03-07 DIAGNOSIS — I10 Essential (primary) hypertension: Secondary | ICD-10-CM | POA: Diagnosis not present

## 2015-03-07 DIAGNOSIS — I222 Subsequent non-ST elevation (NSTEMI) myocardial infarction: Secondary | ICD-10-CM | POA: Diagnosis not present

## 2015-03-07 DIAGNOSIS — D509 Iron deficiency anemia, unspecified: Secondary | ICD-10-CM | POA: Diagnosis not present

## 2015-03-07 DIAGNOSIS — Z950 Presence of cardiac pacemaker: Secondary | ICD-10-CM | POA: Diagnosis not present

## 2015-03-07 DIAGNOSIS — Z8673 Personal history of transient ischemic attack (TIA), and cerebral infarction without residual deficits: Secondary | ICD-10-CM | POA: Diagnosis not present

## 2015-03-07 DIAGNOSIS — Z87440 Personal history of urinary (tract) infections: Secondary | ICD-10-CM | POA: Diagnosis not present

## 2015-03-07 DIAGNOSIS — I5032 Chronic diastolic (congestive) heart failure: Secondary | ICD-10-CM | POA: Diagnosis not present

## 2015-03-07 DIAGNOSIS — F0390 Unspecified dementia without behavioral disturbance: Secondary | ICD-10-CM | POA: Diagnosis not present

## 2015-03-07 DIAGNOSIS — E039 Hypothyroidism, unspecified: Secondary | ICD-10-CM | POA: Diagnosis not present

## 2015-03-08 ENCOUNTER — Encounter: Payer: Self-pay | Admitting: Internal Medicine

## 2015-03-08 DIAGNOSIS — D509 Iron deficiency anemia, unspecified: Secondary | ICD-10-CM | POA: Diagnosis not present

## 2015-03-08 DIAGNOSIS — Z87440 Personal history of urinary (tract) infections: Secondary | ICD-10-CM | POA: Diagnosis not present

## 2015-03-08 DIAGNOSIS — E039 Hypothyroidism, unspecified: Secondary | ICD-10-CM | POA: Diagnosis not present

## 2015-03-08 DIAGNOSIS — I5032 Chronic diastolic (congestive) heart failure: Secondary | ICD-10-CM | POA: Diagnosis not present

## 2015-03-08 DIAGNOSIS — I10 Essential (primary) hypertension: Secondary | ICD-10-CM | POA: Diagnosis not present

## 2015-03-08 DIAGNOSIS — Z8673 Personal history of transient ischemic attack (TIA), and cerebral infarction without residual deficits: Secondary | ICD-10-CM | POA: Diagnosis not present

## 2015-03-08 DIAGNOSIS — F0390 Unspecified dementia without behavioral disturbance: Secondary | ICD-10-CM | POA: Diagnosis not present

## 2015-03-08 DIAGNOSIS — I222 Subsequent non-ST elevation (NSTEMI) myocardial infarction: Secondary | ICD-10-CM | POA: Diagnosis not present

## 2015-03-08 DIAGNOSIS — Z950 Presence of cardiac pacemaker: Secondary | ICD-10-CM | POA: Diagnosis not present

## 2015-03-09 ENCOUNTER — Encounter: Payer: Self-pay | Admitting: Physician Assistant

## 2015-03-09 NOTE — Progress Notes (Signed)
Cardiology Office Note Date:  03/10/2015  Patient ID:  Dana, Keller Apr 22, 1919, MRN [id number] PCP:  Ailene Ravel, MD  Cardiologist:  Dr. [name]  Chief Complaint: f/u hospitalization for NSTEMI  History of Present Illness: [name] is a 81 y.o. female with history of sinus node dysfunction s/p PPM in 2008, previously chronic diastolic CHF (now combined), HTN, HLD, prior CVA, dementia, hypothyroidism, overactive bladder, anemia, hypokalemia, recent NSTEMI who presents for post-hospital follow-up.  She was recently admitted with generalized weakness, SOB, incresaed UOP and decreased oral intake - found to have acute hypoxic respiratory failure with O2 sat 79%. Had been recently diagnosed with UTI. During admission also found to have NSTEMI which was treated medically - trop peak 1.6. D-dimer also high but CTA neg for PE. Aggrenox was changed to Plavix instead. She was treated with 48 hours of heparin and started on lisinopril, metoprolol, and Lasix. 2D Echo 03/01/15: poor image quality, septal HK, ? inferior wall as well, mild LVH, mild focal basal hypertrophy of septum, EF 45-50%, mildly dilated LA, PASP . Her metoprolol was decreased due to prolonged PR. Dr. Duke Salvia was not overly concerned due to PPM but she did not want the patient to start RV pacing or have worsened diastolic dysfunction. Per cardiology note, remote download 02/27/15 showed normal device function. She recommended ECG to check PR interval and ensure she is not regularly RV pacing. The patient was also found to have iron deficiency anemia with no signs of overt bleeding. Last Hgb 10.0 (prev 12-13 range).  She comes in today with her son and his wife who is a Engineer, civil (consulting). She had a bit of wheezing when she left the hospital but she feels like she's doing better now. She and her family agree she is both back to baseline. She sleeps a lot, but this was normal for her pre-MI. No recurrent CP or SOB. She has been  working with PT at her home several times a week. Se has been noted to have some orthostatic drop in BP but no associated symptoms. Lowest BP reported by BP got down to 105/68 with standing so did not actually become hypotensive.  Past Medical History  Diagnosis Date  . Dizziness and giddiness   . Symptomatic bradycardia     a. Near syncope s/p Medtronic pacemaker 2008.  . Iron deficiency anemia, unspecified   . Hypothyroidism   . HTN (hypertension)   . Pure hypercholesterolemia   . Complication of anesthesia     ANXIETY  . Pacemaker   . Confusion Adm - 04/27/2013  . TIA (transient ischemic attack) "many of them"  . Overactive bladder   . Multiple falls     "over the last 6 months she's had 4" (02/28/2015)  . NSTEMI (non-ST elevated myocardial infarction) 02/28/2015    a. Dx 02/2015 - treated medically.  . Osteoarthritis     "everywhere"  . Anxiety   . Depression   . Dementia     "early" (02/28/2015)  . History of stroke     a. Per chart.  . LV dysfunction     a. 2D Echo 03/01/15: poor image quality, septal HK, ? inferior wall as well, mild LVH, mild focal basal hypertrophy of septum, EF 45-50%, mildly dilated LA, PASP .  Marland Kitchen Chronic combined systolic and diastolic CHF (congestive heart failure)     Past Surgical History  Procedure Laterality Date  . Pacemaker insertion  01/05/07    Medtronic, dual chamber. Dr. Sharlot Gowda  [name] MD.  . Partial hip arthroplasty Bilateral   . Insert / replace / remove pacemaker    . Total knee arthroplasty Bilateral   . Cataract extraction, bilateral Bilateral 1960's  . Abdominal hysterectomy      Current Outpatient Prescriptions  Medication Sig Dispense Refill  . aspirin 81 MG chewable tablet Chew 1 tablet (81 mg total) by mouth daily.    . citalopram (CELEXA) 20 MG tablet Take 20 mg by mouth daily.      . clopidogrel (PLAVIX) 75 MG tablet Take 1 tablet (75 mg total) by mouth daily. 30 tablet 1  . fish oil-omega-3 fatty acids 1000 MG capsule  Take 1 g by mouth daily.     . furosemide (LASIX) 20 MG tablet Take 1 tablet (20 mg total) by mouth daily. 30 tablet 1  . levothyroxine (SYNTHROID, LEVOTHROID) 100 MCG tablet Take 100 mcg by mouth daily before breakfast.    . lisinopril (PRINIVIL,ZESTRIL) 5 MG tablet Take 1 tablet (5 mg total) by mouth daily. 30 tablet 1  . metoprolol tartrate (LOPRESSOR) 25 MG tablet Take 0.5 tablets (12.5 mg total) by mouth 2 (two) times daily. 30 tablet 1  . Multiple Vitamin (MULTIVITAMIN) tablet Take 1 tablet by mouth daily.      . nitroGLYCERIN (NITROSTAT) 0.4 MG SL tablet Place 1 tablet (0.4 mg total) under the tongue every 5 (five) minutes as needed for chest pain. 30 tablet 0  . oxyCODONE-acetaminophen (PERCOCET/ROXICET) 5-325 MG per tablet Take 1 tablet by mouth every 8 (eight) hours as needed for severe pain. (Patient taking differently: Take 1 tablet by mouth every 8 (eight) hours as needed for severe pain. Patient is taking 1/2 tablet by mouth every 8 hours as needed for severe pain) 30 tablet 0  . pravastatin (PRAVACHOL) 40 MG tablet Take 40 mg by mouth at bedtime.      . vitamin B-12 (CYANOCOBALAMIN) 1000 MCG tablet Take 1,000 mcg by mouth daily.     No current facility-administered medications for this visit.    Allergies:   Review of patient's allergies indicates no known allergies.   Social History:  The patient  reports that she has never smoked. She has never used smokeless tobacco. She reports that she does not drink alcohol or use illicit drugs.   Family History:  The patient's family history includes COPD in her other; Coronary artery disease in her other; Stroke in her other; Tuberculosis in her mother.  ROS:  Please see the history of present illness.  All other systems are reviewed and otherwise negative.   PHYSICAL EXAM:  VS:  BP 108/58 mmHg  Pulse 63  Ht  (1.575 m)  Wt 140 lb 9.6 oz (63.776 kg)  BMI 25.71 kg/m2  SpO2 98% BMI: Body mass index is 25.71 kg/(m^2). Well  nourished, well developed elderly WF in no acute distress HEENT: normocephalic, atraumatic Neck: no JVD, carotid bruits or masses Cardiac:  normal S1, S2; RRR; no murmurs, rubs, or gallops Lungs:  clear to auscultation bilaterally, no wheezing, rhonchi or rales Abd: soft, nontender, no hepatomegaly, + BS MS: no deformity or atrophy other than kyphotic posture Ext: no edema Skin: warm and dry, no rash Neuro:  moves all extremities spontaneously, no focal abnormalities noted, follows commands Psych: euthymic mood, full affect  EKG:  Done today shows electronic atrial pacemaker 63bpm, one PVC, one ventricularly paced beat, PR 362 ms, prior septal infarct age undetermined, nonspecific ST-T changes  Recent Labs: 06/01/2014: Pro B Natriuretic  peptide (BNP) 513.2* 02/28/2015: ALT 14; B Natriuretic Peptide 539.6*; TSH 1.112 03/01/2015: Magnesium 2.7* 03/02/2015: BUN 13; Creatinine, Ser 0.84; Hemoglobin 10.0*; Platelets 151; Potassium 3.7; Sodium 130*  03/01/2015: Cholesterol 118; HDL 41; LDL Cholesterol 69; Total CHOL/HDL Ratio 2.9; Triglycerides 42; VLDL 8   Estimated Creatinine Clearance: 35.9 mL/min (by C-G formula based on Cr of 0.84).   Wt Readings from Last 3 Encounters:  03/10/15 140 lb 9.6 oz (63.776 kg)  03/02/15 146 lb 9.6 oz (66.497 kg)  08/16/14 143 lb 1.9 oz (64.919 kg)     Other studies reviewed: Additional studies/records reviewed today include: summarized above  ASSESSMENT AND PLAN:  1. Recent NSTEMI - stable with conservative management. She is on ASA, Plavix, BB, and statin. 2. History of pacemaker with prolonged PR interval - she only V paced on one beat by EKG today. She is feeling well. Will have her f/u with EP in December when next pacer check is due to further assess. 3. Chronic combined CHF - check BMET today given recent initiation of Lasix and lisinopril. Her family inquired if she will require Lasix long-term. Will f/u BMET result. If there is any indication she is  on the dry side, will discontinue. She is euvolemic on exam today.  4. Essential hypertension - continue current regimen. Orthostatic drop in BP noted by PT but no associated symptoms. Would follow for now since she is feeling well. 5. Dementia - seems to be oriented to situation today and answers questions appropriately.  Disposition: F/u with EP (Either Dr. [name] or Gypsy Balsam NP) in December when next pacer check is due given recent concern for surveillance for RV pacing.  Current medicines are reviewed at length with the patient today.  The patient did not have any concerns regarding medicines.  Thomasene Mohair PA-C 03/10/2015 11:24 AM     [facility] [address] [place] [address] [phone] (office)  [phone] (fax)

## 2015-03-10 ENCOUNTER — Encounter: Payer: Self-pay | Admitting: Physician Assistant

## 2015-03-10 ENCOUNTER — Ambulatory Visit (INDEPENDENT_AMBULATORY_CARE_PROVIDER_SITE_OTHER): Payer: Medicare Other | Admitting: Physician Assistant

## 2015-03-10 VITALS — BP 108/58 | HR 63 | Ht 62.0 in | Wt 140.6 lb

## 2015-03-10 DIAGNOSIS — I222 Subsequent non-ST elevation (NSTEMI) myocardial infarction: Secondary | ICD-10-CM

## 2015-03-10 DIAGNOSIS — I10 Essential (primary) hypertension: Secondary | ICD-10-CM

## 2015-03-10 DIAGNOSIS — I5042 Chronic combined systolic (congestive) and diastolic (congestive) heart failure: Secondary | ICD-10-CM

## 2015-03-10 DIAGNOSIS — Z8679 Personal history of other diseases of the circulatory system: Secondary | ICD-10-CM

## 2015-03-10 DIAGNOSIS — Z950 Presence of cardiac pacemaker: Secondary | ICD-10-CM

## 2015-03-10 DIAGNOSIS — F0390 Unspecified dementia without behavioral disturbance: Secondary | ICD-10-CM

## 2015-03-10 DIAGNOSIS — I214 Non-ST elevation (NSTEMI) myocardial infarction: Secondary | ICD-10-CM | POA: Diagnosis not present

## 2015-03-10 LAB — BASIC METABOLIC PANEL
BUN: 23 mg/dL (ref 6–23)
CALCIUM: 9.4 mg/dL (ref 8.4–10.5)
CO2: 30 mEq/L (ref 19–32)
Chloride: 99 mEq/L (ref 96–112)
Creatinine, Ser: 0.79 mg/dL (ref 0.40–1.20)
GFR: 71.93 mL/min (ref >60.00)
GLUCOSE: 101 mg/dL — AB (ref 70–99)
Potassium: 4.3 mEq/L (ref 3.5–5.1)
SODIUM: 138 meq/L (ref 135–145)

## 2015-03-10 NOTE — Progress Notes (Signed)
Quick Note:  Please let patient and family know BMET looks good. I would continue current regimen for now since she was feeling stable. (Her family had inquired if she should stop the Lasix but her labs look ok.) Since she is feeling back to baseline on current regimen, I would continue current regimen. Dayna Dunn PA-C  ______

## 2015-03-10 NOTE — Patient Instructions (Signed)
Medication Instructions:  Your physician recommends that you continue on your current medications as directed. Please refer to the Current Medication list given to you today.   Labwork: TODAY:  BMET    Testing/Procedures: None Ordered  Follow-Up: Your physician recommends that you schedule a follow-up appointment in: December with EP: Dr. [name] or Gypsy Balsam, NP at the time of her pacer check.  She is scheduled for Remote device check, but we would rather the pt come in for office visit instead.    Follow up with your Primary Care Physician for Anemia.

## 2015-03-13 DIAGNOSIS — F419 Anxiety disorder, unspecified: Secondary | ICD-10-CM | POA: Diagnosis not present

## 2015-03-13 DIAGNOSIS — E780 Pure hypercholesterolemia: Secondary | ICD-10-CM | POA: Diagnosis not present

## 2015-03-13 DIAGNOSIS — M5126 Other intervertebral disc displacement, lumbar region: Secondary | ICD-10-CM | POA: Diagnosis not present

## 2015-03-13 DIAGNOSIS — D649 Anemia, unspecified: Secondary | ICD-10-CM | POA: Diagnosis not present

## 2015-03-13 DIAGNOSIS — M1990 Unspecified osteoarthritis, unspecified site: Secondary | ICD-10-CM | POA: Diagnosis not present

## 2015-03-14 DIAGNOSIS — D509 Iron deficiency anemia, unspecified: Secondary | ICD-10-CM | POA: Diagnosis not present

## 2015-03-14 DIAGNOSIS — E039 Hypothyroidism, unspecified: Secondary | ICD-10-CM | POA: Diagnosis not present

## 2015-03-14 DIAGNOSIS — I10 Essential (primary) hypertension: Secondary | ICD-10-CM | POA: Diagnosis not present

## 2015-03-14 DIAGNOSIS — Z950 Presence of cardiac pacemaker: Secondary | ICD-10-CM | POA: Diagnosis not present

## 2015-03-14 DIAGNOSIS — Z87440 Personal history of urinary (tract) infections: Secondary | ICD-10-CM | POA: Diagnosis not present

## 2015-03-14 DIAGNOSIS — F0390 Unspecified dementia without behavioral disturbance: Secondary | ICD-10-CM | POA: Diagnosis not present

## 2015-03-14 DIAGNOSIS — Z8673 Personal history of transient ischemic attack (TIA), and cerebral infarction without residual deficits: Secondary | ICD-10-CM | POA: Diagnosis not present

## 2015-03-14 DIAGNOSIS — I222 Subsequent non-ST elevation (NSTEMI) myocardial infarction: Secondary | ICD-10-CM | POA: Diagnosis not present

## 2015-03-14 DIAGNOSIS — I5032 Chronic diastolic (congestive) heart failure: Secondary | ICD-10-CM | POA: Diagnosis not present

## 2015-03-16 DIAGNOSIS — D509 Iron deficiency anemia, unspecified: Secondary | ICD-10-CM | POA: Diagnosis not present

## 2015-03-16 DIAGNOSIS — I5032 Chronic diastolic (congestive) heart failure: Secondary | ICD-10-CM | POA: Diagnosis not present

## 2015-03-16 DIAGNOSIS — Z8673 Personal history of transient ischemic attack (TIA), and cerebral infarction without residual deficits: Secondary | ICD-10-CM | POA: Diagnosis not present

## 2015-03-16 DIAGNOSIS — F0390 Unspecified dementia without behavioral disturbance: Secondary | ICD-10-CM | POA: Diagnosis not present

## 2015-03-16 DIAGNOSIS — Z87440 Personal history of urinary (tract) infections: Secondary | ICD-10-CM | POA: Diagnosis not present

## 2015-03-16 DIAGNOSIS — Z950 Presence of cardiac pacemaker: Secondary | ICD-10-CM | POA: Diagnosis not present

## 2015-03-16 DIAGNOSIS — I222 Subsequent non-ST elevation (NSTEMI) myocardial infarction: Secondary | ICD-10-CM | POA: Diagnosis not present

## 2015-03-16 DIAGNOSIS — E039 Hypothyroidism, unspecified: Secondary | ICD-10-CM | POA: Diagnosis not present

## 2015-03-16 DIAGNOSIS — I10 Essential (primary) hypertension: Secondary | ICD-10-CM | POA: Diagnosis not present

## 2015-03-20 DIAGNOSIS — I10 Essential (primary) hypertension: Secondary | ICD-10-CM | POA: Diagnosis not present

## 2015-03-20 DIAGNOSIS — Z87440 Personal history of urinary (tract) infections: Secondary | ICD-10-CM | POA: Diagnosis not present

## 2015-03-20 DIAGNOSIS — Z950 Presence of cardiac pacemaker: Secondary | ICD-10-CM | POA: Diagnosis not present

## 2015-03-20 DIAGNOSIS — I5032 Chronic diastolic (congestive) heart failure: Secondary | ICD-10-CM | POA: Diagnosis not present

## 2015-03-20 DIAGNOSIS — I222 Subsequent non-ST elevation (NSTEMI) myocardial infarction: Secondary | ICD-10-CM | POA: Diagnosis not present

## 2015-03-20 DIAGNOSIS — E039 Hypothyroidism, unspecified: Secondary | ICD-10-CM | POA: Diagnosis not present

## 2015-03-20 DIAGNOSIS — Z8673 Personal history of transient ischemic attack (TIA), and cerebral infarction without residual deficits: Secondary | ICD-10-CM | POA: Diagnosis not present

## 2015-03-20 DIAGNOSIS — D509 Iron deficiency anemia, unspecified: Secondary | ICD-10-CM | POA: Diagnosis not present

## 2015-03-20 DIAGNOSIS — F0390 Unspecified dementia without behavioral disturbance: Secondary | ICD-10-CM | POA: Diagnosis not present

## 2015-04-03 DIAGNOSIS — Z6823 Body mass index (BMI) 23.0-23.9, adult: Secondary | ICD-10-CM | POA: Diagnosis not present

## 2015-04-03 DIAGNOSIS — Z1389 Encounter for screening for other disorder: Secondary | ICD-10-CM | POA: Diagnosis not present

## 2015-04-03 DIAGNOSIS — R350 Frequency of micturition: Secondary | ICD-10-CM | POA: Diagnosis not present

## 2015-04-04 ENCOUNTER — Telehealth: Payer: Self-pay | Admitting: Internal Medicine

## 2015-04-04 DIAGNOSIS — I493 Ventricular premature depolarization: Secondary | ICD-10-CM

## 2015-04-04 NOTE — Telephone Encounter (Signed)
Spoke with grand-daughter and she is going to get her medications and get back with me tomorrow. She says she was told to take 1/4 of a 25 mg tablet of Metoprolol but she is going to get all her notes and call me back tomorrow

## 2015-04-04 NOTE — Telephone Encounter (Signed)
New Message   Pt daughter is concerned she is urinating more than half  Of last week, patient daughter wants to receive a call back form the RN   She wants to know if medication can be altered

## 2015-04-04 NOTE — Telephone Encounter (Signed)
Questioning about the BP pill that was changed in the hospital.  She says her BP has been up since being home 150's.  She has asked me to call her daughter(pt's granddaughter) to clarify medications.  Her name is Dana HaleMary Keller [phone] ext [phone].  I have placed a call to her and left a mess for her to return my call to discuss medications

## 2015-04-05 NOTE — Telephone Encounter (Signed)
Follow up     Pt c/o BP issue: STAT if pt c/o blurred vision, one-sided weakness or slurred speech  1. What are your last 5 BP readings? All am readings--168/93 HR  62, 192/101 HR 70, 222/83, 136/81 HR 72, 166/88 HR 73, afternoon readings 125/68 HR 87, 123/66 HR 73, 157/81 HR 61, 127/72 HR 63------after rx 177/80  2. Are you having any other symptoms (ex. Dizziness, headache, blurred vision, passed out)? no 3. What is your BP issue? Calling to give bp readings.  (Pt does not take her medication at the same time each day)

## 2015-04-05 NOTE — Telephone Encounter (Signed)
The patient's grand daughter, [name], called back to confirm the faxed medication list was received. This was located and I spoke with [name] about the patient's metoprolol dosing. Per the 03/02/15 discharge summary, she was discharged on metoprolol tartrate 25 mg 1/2 tablet (12.5 mg) BID. Per [name], she confirms that she was told at discharge, the if there was not a 12.5 mg tablet, they would need to 1/4 the 25 mg tablet for 6.25 mg to be taken twice daily. [facility], which a copy of the patient's medications she received at discharge, does state metoprolol 25 mg take 1/2 tablet (12.5 mg ) BID, with comments below stating "the Doctor would like for you to take 6.25 mg of this medication. Please hold if HR <60."  Per [name], they have been giving the patient metoprolol 25 mg 1/4 tablet (6.25 mg) BID since discharge 03/02/15. [name] did confirm this is the dose the patient was taking when she was seen by Ronie Spiesayna Dunn, PA on 03/10/15. At that visit, the patient's BP was 108/58 HR- 63. I inquired if they have been checking the patient's BP/ HR at home. Per [name], her mother and uncle have been checking this and she is not sure what the numbers have been. She will find this out and call Tresa EndoKelly back later today. I advised that if she has been running similar BP/ HR to what she was in the office, that she will probably not tolerate a much higher dose of metoprolol than the 6.25 mg BID they have been giving her. [name] will call back to confirm.

## 2015-04-05 NOTE — Telephone Encounter (Signed)
Spoke with Dana HaleMary Keller and she is going to give her medications on a more regulated pattern and see if this helps with her BP.  She will call me back next week with readings.  If still up will increase the dose to 12.5mg  bid

## 2015-04-14 NOTE — Telephone Encounter (Signed)
Received a fax today of BP readings.  Her am readings are high.  Per her hospital d/c summary she was to be on 12.5 mg twice daily but the mediation sheet she faxed me says 6.25mg  twice daily.    She is going to increase her pm dose of Metoprolol to 12.5 mg and get more reads and fax to me next week.

## 2015-04-17 DIAGNOSIS — Z6824 Body mass index (BMI) 24.0-24.9, adult: Secondary | ICD-10-CM | POA: Diagnosis not present

## 2015-04-17 DIAGNOSIS — J069 Acute upper respiratory infection, unspecified: Secondary | ICD-10-CM | POA: Diagnosis not present

## 2015-04-25 ENCOUNTER — Encounter: Payer: Self-pay | Admitting: Internal Medicine

## 2015-04-25 NOTE — Telephone Encounter (Signed)
[name] at 04/25/2015 1:34 PM     Status: Signed       Expand All Collapse All   New Message  Pt grandaughter calling to speak w/ RN about pt's Lisinopril, Plavix, lasix- all prescribed during hospital stay. Please call back and discuss.

## 2015-04-25 NOTE — Telephone Encounter (Signed)
Follow Up  Pt grand daughter returned call

## 2015-04-25 NOTE — Telephone Encounter (Signed)
This encounter was created in error - please disregard.

## 2015-04-25 NOTE — Telephone Encounter (Signed)
lmom for granddaughter to return my call

## 2015-04-25 NOTE — Telephone Encounter (Signed)
New Message  Pt grandaughter calling to speak w/ RN about pt's Lisinopril, Plavix, lasix- all prescribed during hospital stay. Please call back and discuss.

## 2015-04-26 ENCOUNTER — Telehealth: Payer: Self-pay | Admitting: Internal Medicine

## 2015-04-26 ENCOUNTER — Other Ambulatory Visit: Payer: Self-pay | Admitting: *Deleted

## 2015-04-26 MED ORDER — LISINOPRIL 5 MG PO TABS: 5.0000 mg | ORAL_TABLET | Freq: Every day | ORAL | Status: DC

## 2015-04-26 MED ORDER — LISINOPRIL 5 MG PO TABS
5.0000 mg | ORAL_TABLET | Freq: Every day | ORAL | Status: DC
Start: 2015-04-26 — End: 2015-04-26

## 2015-04-26 MED ORDER — CLOPIDOGREL BISULFATE 75 MG PO TABS
75.0000 mg | ORAL_TABLET | Freq: Every day | ORAL | Status: DC
Start: 2015-04-26 — End: 2015-08-21

## 2015-04-26 MED ORDER — FUROSEMIDE 20 MG PO TABS: 20.0000 mg | ORAL_TABLET | Freq: Every day | ORAL | Status: DC

## 2015-04-26 NOTE — Telephone Encounter (Signed)
Spoke with granddaughter and let her know meds were called in

## 2015-04-26 NOTE — Telephone Encounter (Signed)
lmom for granddaughter that I have spoken with pharmacy and all medications are in

## 2015-04-26 NOTE — Telephone Encounter (Signed)
Called and left a message on her VM, I will try back

## 2015-04-26 NOTE — Telephone Encounter (Signed)
F/u  Pt grandaughter calling to speak w/ Rn- stated that the Rxs were incorrectly sent in today- they were Lisinopril and Amlodipine (stated this was d/c at the hospital). Pt grand daughter stated that Lisinopril, Lasix, and Plavix should have been sent in . Please call back and discuss.

## 2015-04-28 ENCOUNTER — Telehealth: Payer: Self-pay | Admitting: Internal Medicine

## 2015-04-28 NOTE — Telephone Encounter (Signed)
Granddaughter faxed over BP readings this morning and would like to know if Dr. Ladona Ridgelaylor would like to make any changes to medications before she refills pt's meds. Vitals as follows: 04/14/15- AM 136/76, 61, wt 139--PM 149/89, 63 04/15/15- AM 122/76, 62, wt 138.4--PM 125/72, 79 04/16/15- AM 125/72, 79, wt 139,-- PM 117/67, 61 04/17/15- AM 137/78, 65, wt 140-- PM 215/105 (6:55PM), 76, 204/100 (7PM), 68, took meds and then 129/76 (8:10PM), 64 04/18/15- AM 182/93, 84, wt 140.4-- PM 146/75, 61 04/19/15- AM 165/92, 72, wt 139.4-- PM 126/70, 67 04/20/15- AM 182/85, 64, wt 139-- PM 153/95 (2:15PM), 78, 136/85 (7:30PM), 77 04/21/15- AM 138/58, 62, wt 138.6 04/22/15- AM 162/84, 65, wt 138.8-- PM 125/55, 50 04/23/15- AM 190/97, 65, wt 139-- PM 136/78, 60 04/24/15- AM 214/102 (before meds), 70, wt 138.2-- PM 118/63, 71, 140/65, 60 04/25/15- AM 125/65, 71, wt 138.8-- PM 159/83, 60 04/26/15- AM 163/78, 75, wt 139.8-- PM 152/81, 82 04/27/15- AM 210/101 (before meds), 62, wt 139.4-- PM 109/61, 59  Advised granddaughter that I would forward this information to Dr. Ladona Ridgelaylor and would call with any recommendations if he responded today. Advised granddaughter that it may be Monday before we have a response though. She verbalized understanding and was in agreement with this plan.

## 2015-04-28 NOTE — Telephone Encounter (Signed)
New Message    Pt's grand daughter calling stating that she faxed the pt's recent BP readings this morning and wants to know if pt's medications need to be changed. Please call back and advise.

## 2015-05-02 NOTE — Telephone Encounter (Signed)
Lm Tresa EndoKelly will call back once Dr Ladona Ridgelaylor reviews BPs.

## 2015-05-02 NOTE — Telephone Encounter (Signed)
Follow up      Calling because pt is wanting to hear what Dr Ladona Ridgelaylor said about the bp readings that were faxed by granddaughter.  Please call

## 2015-05-03 NOTE — Telephone Encounter (Signed)
No change in medications at this time.  

## 2015-05-14 NOTE — Telephone Encounter (Signed)
Would recommend she increase her evening dose of metoprolol to 25 mg at nightime.

## 2015-05-26 ENCOUNTER — Telehealth: Payer: Self-pay | Admitting: Internal Medicine

## 2015-05-26 ENCOUNTER — Other Ambulatory Visit: Payer: Self-pay | Admitting: *Deleted

## 2015-05-26 MED ORDER — METOPROLOL TARTRATE 25 MG PO TABS: 12.5000 mg | ORAL_TABLET | Freq: Two times a day (BID) | ORAL | Status: DC

## 2015-05-26 NOTE — Telephone Encounter (Signed)
°*  STAT* If patient is at the pharmacy, call can be transferred to refill team.   1. Which medications need to be refilled? (please list name of each medication and dose if known) Metoprolol  2. Which pharmacy/location (including street and city if local pharmacy) is medication to be sent to? [facility] in [place], [place]  3. Do they need a 30 day or 90 day supply? 90 Day

## 2015-05-29 DIAGNOSIS — I10 Essential (primary) hypertension: Secondary | ICD-10-CM | POA: Diagnosis not present

## 2015-05-29 DIAGNOSIS — E039 Hypothyroidism, unspecified: Secondary | ICD-10-CM | POA: Diagnosis not present

## 2015-05-29 DIAGNOSIS — M25552 Pain in left hip: Secondary | ICD-10-CM | POA: Diagnosis not present

## 2015-05-29 DIAGNOSIS — E7800 Pure hypercholesterolemia, unspecified: Secondary | ICD-10-CM | POA: Diagnosis not present

## 2015-05-29 DIAGNOSIS — M1990 Unspecified osteoarthritis, unspecified site: Secondary | ICD-10-CM | POA: Diagnosis not present

## 2015-05-30 ENCOUNTER — Emergency Department (HOSPITAL_COMMUNITY)
Admission: EM | Admit: 2015-05-30 | Discharge: 2015-05-30 | Disposition: A | Discharge status: Home / Self Care | Payer: Medicare Other | Attending: Emergency Medicine | Admitting: Emergency Medicine

## 2015-05-30 ENCOUNTER — Emergency Department (HOSPITAL_COMMUNITY): Payer: Medicare Other

## 2015-05-30 ENCOUNTER — Encounter (HOSPITAL_COMMUNITY): Payer: Self-pay | Admitting: Emergency Medicine

## 2015-05-30 DIAGNOSIS — W01198A Fall on same level from slipping, tripping and stumbling with subsequent striking against other object, initial encounter: Secondary | ICD-10-CM | POA: Insufficient documentation

## 2015-05-30 DIAGNOSIS — I5042 Chronic combined systolic (congestive) and diastolic (congestive) heart failure: Secondary | ICD-10-CM | POA: Diagnosis not present

## 2015-05-30 DIAGNOSIS — Z950 Presence of cardiac pacemaker: Secondary | ICD-10-CM | POA: Insufficient documentation

## 2015-05-30 DIAGNOSIS — S4991XA Unspecified injury of right shoulder and upper arm, initial encounter: Secondary | ICD-10-CM | POA: Diagnosis not present

## 2015-05-30 DIAGNOSIS — W19XXXA Unspecified fall, initial encounter: Secondary | ICD-10-CM

## 2015-05-30 DIAGNOSIS — Z87448 Personal history of other diseases of urinary system: Secondary | ICD-10-CM | POA: Diagnosis not present

## 2015-05-30 DIAGNOSIS — F419 Anxiety disorder, unspecified: Secondary | ICD-10-CM | POA: Insufficient documentation

## 2015-05-30 DIAGNOSIS — M25511 Pain in right shoulder: Secondary | ICD-10-CM | POA: Diagnosis not present

## 2015-05-30 DIAGNOSIS — Z7902 Long term (current) use of antithrombotics/antiplatelets: Secondary | ICD-10-CM | POA: Insufficient documentation

## 2015-05-30 DIAGNOSIS — E7800 Pure hypercholesterolemia, unspecified: Secondary | ICD-10-CM | POA: Diagnosis not present

## 2015-05-30 DIAGNOSIS — Z79899 Other long term (current) drug therapy: Secondary | ICD-10-CM | POA: Diagnosis not present

## 2015-05-30 DIAGNOSIS — E039 Hypothyroidism, unspecified: Secondary | ICD-10-CM | POA: Diagnosis not present

## 2015-05-30 DIAGNOSIS — Y92009 Unspecified place in unspecified non-institutional (private) residence as the place of occurrence of the external cause: Secondary | ICD-10-CM | POA: Insufficient documentation

## 2015-05-30 DIAGNOSIS — Z862 Personal history of diseases of the blood and blood-forming organs and certain disorders involving the immune mechanism: Secondary | ICD-10-CM | POA: Insufficient documentation

## 2015-05-30 DIAGNOSIS — M25551 Pain in right hip: Secondary | ICD-10-CM | POA: Diagnosis not present

## 2015-05-30 DIAGNOSIS — Z7982 Long term (current) use of aspirin: Secondary | ICD-10-CM | POA: Diagnosis not present

## 2015-05-30 DIAGNOSIS — M1990 Unspecified osteoarthritis, unspecified site: Secondary | ICD-10-CM | POA: Insufficient documentation

## 2015-05-30 DIAGNOSIS — Y9389 Activity, other specified: Secondary | ICD-10-CM | POA: Insufficient documentation

## 2015-05-30 DIAGNOSIS — Z8673 Personal history of transient ischemic attack (TIA), and cerebral infarction without residual deficits: Secondary | ICD-10-CM | POA: Diagnosis not present

## 2015-05-30 DIAGNOSIS — T148 Other injury of unspecified body region: Secondary | ICD-10-CM | POA: Diagnosis not present

## 2015-05-30 DIAGNOSIS — I252 Old myocardial infarction: Secondary | ICD-10-CM | POA: Diagnosis not present

## 2015-05-30 DIAGNOSIS — S79911A Unspecified injury of right hip, initial encounter: Secondary | ICD-10-CM | POA: Diagnosis not present

## 2015-05-30 DIAGNOSIS — F329 Major depressive disorder, single episode, unspecified: Secondary | ICD-10-CM | POA: Insufficient documentation

## 2015-05-30 DIAGNOSIS — F0390 Unspecified dementia without behavioral disturbance: Secondary | ICD-10-CM | POA: Insufficient documentation

## 2015-05-30 DIAGNOSIS — Y998 Other external cause status: Secondary | ICD-10-CM | POA: Diagnosis not present

## 2015-05-30 NOTE — ED Provider Notes (Signed)
CSN: [id number]     Arrival date & time 05/30/15  1116 History   First MD Initiated Contact with Patient 05/30/15 1146     Chief Complaint  Patient presents with  . Shoulder Pain  . Hip Pain     HPI Patient was brought to the emergency department after falling last night.  She struck her right shoulder and her right hip.  She awoke this morning was having ongoing discomfort and pain in her right shoulder and right hip.  The family reports that she's been falling more often as she has been resistant to using her walker.  No head injury.  No loss consciousness.  Denies neck pain.  She reports mild pain with range of motion of her right shoulder localizes this to the right shoulder joint.  She also reports mild discomfort and pain to the right hip.  She has been ambulatory at home since the fall.  No altered mental status.  No recent illness.  Symptoms are mild in severity.  The patient.  The family is requesting x-rays to evaluate for possible fracture.   Past Medical History  Diagnosis Date  . Dizziness and giddiness   . Symptomatic bradycardia     a. Near syncope s/p Medtronic pacemaker 2008.  . Iron deficiency anemia, unspecified   . Hypothyroidism   . HTN (hypertension)   . Pure hypercholesterolemia   . Complication of anesthesia     ANXIETY  . Pacemaker   . Confusion Adm - 04/27/2013  . TIA (transient ischemic attack) "many of them"  . Overactive bladder   . Multiple falls     "over the last 6 months she's had 4" (02/28/2015)  . NSTEMI (non-ST elevated myocardial infarction) (HCC) 02/28/2015    a. Dx 02/2015 - treated medically.  . Osteoarthritis     "everywhere"  . Anxiety   . Depression   . Dementia     "early" (02/28/2015)  . History of stroke     a. Per chart.  . LV dysfunction     a. 2D Echo 03/01/15: poor image quality, septal HK, ? inferior wall as well, mild LVH, mild focal basal hypertrophy of septum, EF 45-50%, mildly dilated LA, PASP .  Marland Kitchen Chronic combined  systolic and diastolic CHF (congestive heart failure) [facility])    Past Surgical History  Procedure Laterality Date  . Pacemaker insertion  01/05/07    Medtronic, dual chamber. Dr. Lewayne Bunting MD.  . Partial hip arthroplasty Bilateral   . Insert / replace / remove pacemaker    . Total knee arthroplasty Bilateral   . Cataract extraction, bilateral Bilateral 1960's  . Abdominal hysterectomy     Family History  Problem Relation Age of Onset  . Coronary artery disease Other   . Stroke Other   . COPD Other   . Tuberculosis Mother    Social History  Substance Use Topics  . Smoking status: Never Smoker   . Smokeless tobacco: Never Used  . Alcohol Use: No   OB History    No data available     Review of Systems  All other systems reviewed and are negative.     Allergies  Review of patient's allergies indicates no known allergies.  Home Medications   Prior to Admission medications   Medication Sig Start Date End Date Taking? Authorizing Provider  aspirin 81 MG chewable tablet Chew 1 tablet (81 mg total) by mouth daily. 03/02/15  Yes Vassie Loll, MD  citalopram (CELEXA) 20 MG  tablet Take 20 mg by mouth daily.    Yes Historical Provider, MD  clopidogrel (PLAVIX) 75 MG tablet Take 1 tablet (75 mg total) by mouth daily. 04/26/15  Yes Marinus MawGregg W Taylor, MD  furosemide (LASIX) 20 MG tablet Take 1 tablet (20 mg total) by mouth daily. 04/26/15  Yes Marinus MawGregg W Taylor, MD  gabapentin (NEURONTIN) 100 MG capsule Take 100 mg by mouth daily.   Yes Historical Provider, MD  levothyroxine (SYNTHROID, LEVOTHROID) 100 MCG tablet Take 100 mcg by mouth daily before breakfast.   Yes Historical Provider, MD  lisinopril (PRINIVIL,ZESTRIL) 5 MG tablet Take 1 tablet (5 mg total) by mouth daily. 04/26/15  Yes Marinus MawGregg W Taylor, MD  loratadine (CLARITIN) 10 MG tablet Take 10 mg by mouth at bedtime.   Yes Historical Provider, MD  metoprolol tartrate (LOPRESSOR) 25 MG tablet Take 0.5 tablets (12.5 mg total) by mouth 2  (two) times daily. Patient taking differently: Take 6.25-12.5 mg by mouth 2 (two) times daily. 6.25-am 12.5-pm 05/26/15  Yes Marinus MawGregg W Taylor, MD  Multiple Vitamin (MULTIVITAMIN) tablet Take 1 tablet by mouth daily.     Yes Historical Provider, MD  nitroGLYCERIN (NITROSTAT) 0.4 MG SL tablet Place 1 tablet (0.4 mg total) under the tongue every 5 (five) minutes as needed for chest pain. 06/01/14  Yes Roxy Horsemanobert Browning, PA-C  Omega-3 Fatty Acids (FISH OIL) 1000 MG CAPS Take 1,000 mg by mouth daily.   Yes Historical Provider, MD  oxyCODONE-acetaminophen (PERCOCET) 10-325 MG tablet Take 0.5 tablets by mouth 2 (two) times daily as needed for pain.   Yes Historical Provider, MD  oxyCODONE-acetaminophen (PERCOCET/ROXICET) 5-325 MG per tablet Take 1 tablet by mouth every 8 (eight) hours as needed for severe pain. Patient taking differently: Take 1 tablet by mouth every 8 (eight) hours as needed for severe pain. Patient is taking 1/2 tablet by mouth every 8 hours as needed for severe pain 03/02/15  Yes Vassie Lollarlos Madera, MD  pravastatin (PRAVACHOL) 40 MG tablet Take 40 mg by mouth at bedtime.     Yes Historical Provider, MD   BP 129/65 mmHg  Pulse 60  Temp(Src) 98.9 F (37.2 C) (Oral)  Resp 18  SpO2 97% Physical Exam  Constitutional: She is oriented to person, place, and time. She appears well-developed and well-nourished. No distress.  HENT:  Head: Normocephalic and atraumatic.  Eyes: EOM are normal.  Neck: Normal range of motion.  C-spine nontender  Cardiovascular: Normal rate, regular rhythm and normal heart sounds.   Pulmonary/Chest: Effort normal and breath sounds normal.  Abdominal: Soft. She exhibits no distension. There is no tenderness.  Musculoskeletal: Normal range of motion.  Full range of motion of bilateral elbows and wrists.  Mild pain with range of motion of the right shoulder although range of motion is not limited.  Mild pain with range of motion of the right hip.  Able to fully range.  No  shortening or rotation noted  Neurological: She is alert and oriented to person, place, and time.  Skin: Skin is warm and dry.  Psychiatric: She has a normal mood and affect. Judgment normal.  Nursing note and vitals reviewed.   ED Course  Procedures (including critical care time) Labs Review Labs Reviewed - No data to display  Imaging Review Dg Clavicle Right  05/30/2015  CLINICAL DATA:  Larey SeatFell EXAM: RIGHT CLAVICLE - 2+ VIEWS COMPARISON:  01/01/2013 FINDINGS: Advanced degenerative changes and possible posttraumatic deformity of the glenoid and humeral head with remodeling of both, subchondral sclerosis  and some marginal spurring. Clavicle and AC joint intact. Transvenous pacing leads partially visualized. Atheromatous aorta. IMPRESSION: 1. Advanced glenohumeral degenerative changes without fracture, dislocation, or other acute abnormality identified. Electronically Signed   By: Corlis Leak M.D.   On: 05/30/2015 13:00   Dg Hip Unilat  With Pelvis 2-3 Views Right  05/30/2015  CLINICAL DATA:  Fall last night onto right side; right clavicle pain and right hip pain EXAM: DG HIP (WITH OR WITHOUT PELVIS) 2-3V RIGHT COMPARISON:  None. FINDINGS: Right hip arthroplasty hardware intact. No fracture or dislocation. Left hip arthroplasty hardware partially visualized, unremarkable. Heavy aortoiliac arterial calcifications. Bilateral pelvic phleboliths. IMPRESSION: 1. Unremarkable appearance of right hip arthroplasty. No fracture or dislocation. Electronically Signed   By: Corlis Leak M.D.   On: 05/30/2015 12:56   I have personally reviewed and evaluated these images and lab results as part of my medical decision-making.   EKG Interpretation None      MDM   Final diagnoses:  Right hip pain  Right shoulder pain    Patient be discharged home in good condition.  Forward motion of right shoulder and right hip.  Plain films are without fracture.  Discharge home in good condition.  I've encouraged her to  use her walker more frequently.  I recommended that her family contact the primary care physician is I think she'll benefit from home health physical therapy as I'm concerned that she is becoming more debilitated over the past several months given her advanced age.    Azalia Bilis, MD 05/30/15 [id number]

## 2015-05-30 NOTE — ED Notes (Signed)
Pt here for a fall last night, pain to rt shoulder and rt hip. Did get up and walk with family this am. No loc, unknown if she hit arm on furniture. Lives at home with family that stays with her. Alert x4. Strong pulses. No shortening or rotation.

## 2015-05-30 NOTE — ED Notes (Signed)
Waiting on d/c paperwork. Family at bedside

## 2015-05-30 NOTE — ED Notes (Signed)
Patient transported to X-ray 

## 2015-05-31 ENCOUNTER — Encounter: Payer: Self-pay | Admitting: Internal Medicine

## 2015-06-01 ENCOUNTER — Other Ambulatory Visit: Payer: Self-pay | Admitting: *Deleted

## 2015-06-01 MED ORDER — METOPROLOL TARTRATE 25 MG PO TABS: 12.5000 mg | ORAL_TABLET | Freq: Two times a day (BID) | ORAL | Status: AC

## 2015-06-01 MED ORDER — METOPROLOL TARTRATE 25 MG PO TABS: 12.5000 mg | ORAL_TABLET | Freq: Two times a day (BID) | ORAL | Status: DC

## 2015-06-02 ENCOUNTER — Encounter: Payer: Medicare Other | Admitting: Internal Medicine

## 2015-06-06 ENCOUNTER — Encounter: Payer: Self-pay | Admitting: Internal Medicine

## 2015-06-06 ENCOUNTER — Ambulatory Visit (INDEPENDENT_AMBULATORY_CARE_PROVIDER_SITE_OTHER): Payer: Medicare Other | Admitting: Internal Medicine

## 2015-06-06 VITALS — BP 110/62 | HR 64 | Ht 62.0 in | Wt 146.0 lb

## 2015-06-06 DIAGNOSIS — I10 Essential (primary) hypertension: Secondary | ICD-10-CM

## 2015-06-06 DIAGNOSIS — I5042 Chronic combined systolic (congestive) and diastolic (congestive) heart failure: Secondary | ICD-10-CM

## 2015-06-06 DIAGNOSIS — I214 Non-ST elevation (NSTEMI) myocardial infarction: Secondary | ICD-10-CM | POA: Diagnosis not present

## 2015-06-06 DIAGNOSIS — Z950 Presence of cardiac pacemaker: Secondary | ICD-10-CM

## 2015-06-06 LAB — CUP PACEART INCLINIC DEVICE CHECK
Battery Impedance: 2513 Ohm
Battery Voltage: 2.69 V
Brady Statistic AP VP Percent: 3 %
Brady Statistic AP VS Percent: 76 %
Brady Statistic AS VP Percent: 1 %
Brady Statistic AS VS Percent: 20 %
Implantable Lead Implant Date: 20080721
Implantable Lead Location: 753859
Implantable Lead Location: 753860
Implantable Lead Model: 5076
Lead Channel Impedance Value: 543 Ohm
Lead Channel Impedance Value: 753 Ohm
Lead Channel Pacing Threshold Pulse Width: 0.4 ms
Lead Channel Sensing Intrinsic Amplitude: 15.68 mV
Lead Channel Sensing Intrinsic Amplitude: 2 mV
Lead Channel Setting Pacing Pulse Width: 0.4 ms
MDC IDC LEAD IMPLANT DT: 20080721
MDC IDC MSMT BATTERY REMAINING LONGEVITY: 20 mo
MDC IDC MSMT LEADCHNL RA PACING THRESHOLD AMPLITUDE: 0.5 V
MDC IDC MSMT LEADCHNL RV PACING THRESHOLD AMPLITUDE: 0.5 V
MDC IDC MSMT LEADCHNL RV PACING THRESHOLD PULSEWIDTH: 0.4 ms
MDC IDC SESS DTM: 20161220172100
MDC IDC SET LEADCHNL RA PACING AMPLITUDE: 1.5 V
MDC IDC SET LEADCHNL RV PACING AMPLITUDE: 2 V
MDC IDC SET LEADCHNL RV SENSING SENSITIVITY: 5.6 mV

## 2015-06-06 MED ORDER — LISINOPRIL 5 MG PO TABS: 5.0000 mg | ORAL_TABLET | Freq: Every day | ORAL | Status: DC

## 2015-06-06 NOTE — Assessment & Plan Note (Addendum)
Her blood pressure is good today. Will recheck. She has had some a.m. Hypertension so I asked the patient to take her lisinopril at night.

## 2015-06-06 NOTE — Assessment & Plan Note (Signed)
Her medtronic DDD PM is working normally. Will recheck in several months. 

## 2015-06-06 NOTE — Patient Instructions (Signed)
Medication Instructions:  Your physician recommends that you continue on your current medications as directed. Please refer to the Current Medication list given to you today. Take your Lisinopril at night  Labwork: None ordered   Testing/Procedures: None ordered   Follow-Up: Your physician wants you to follow-up in: 12 months with Dr Court Joyaylor You will receive a reminder letter in the mail two months in advance. If you don't receive a letter, please call our office to schedule the follow-up appointment.  Remote monitoring is used to monitor your Pacemaker  from home. This monitoring reduces the number of office visits required to check your device to one time per year. It allows us to keep an eye on the functioning of your device to ensure it is working properly. You are scheduled for a device check from home on 09/05/15. You may send your transmission at any time that day. If you have a wireless device, the transmission will be sent automatically. After your physician reviews your transmission, you will receive a postcard with your next transmission date.    Any Other Special Instructions Will Be Listed Below (If Applicable).     If you need a refill on your cardiac medications before your next appointment, please call your pharmacy.

## 2015-06-06 NOTE — Assessment & Plan Note (Signed)
Her symptoms are class 2. She is limited by inability to walk and propensity to fall as much as anything. She will continue her current meds and maintain a low sodium diet.

## 2015-06-06 NOTE — Assessment & Plan Note (Signed)
She denies anginal symptoms. She is limited by her propensity to fall.

## 2015-06-06 NOTE — Progress Notes (Signed)
HPI [name] returns today for follow-up. She is a very pleasant 81 year old woman with a history of symptomatic bradycardia, status post pacemaker insertion, hypertension, with labile blood pressures. She also has chest pain, which is likely noncardiac and certainly nonexertional. Her son who is with her today notes that her blood pressure at times is in the 200 range especially in the morning, and at other times is in the 95-100 range. She denies peripheral edema. Minimal dyspnea though she is becoming more inactive. No Known Allergies   Current Outpatient Prescriptions  Medication Sig Dispense Refill  . aspirin 81 MG chewable tablet Chew 1 tablet (81 mg total) by mouth daily.    . citalopram (CELEXA) 20 MG tablet Take 20 mg by mouth daily.     . clopidogrel (PLAVIX) 75 MG tablet Take 1 tablet (75 mg total) by mouth daily. 90 tablet 3  . furosemide (LASIX) 20 MG tablet Take 1 tablet (20 mg total) by mouth daily. 90 tablet 3  . gabapentin (NEURONTIN) 100 MG capsule Take 100 mg by mouth daily.    Marland Kitchen levothyroxine (SYNTHROID, LEVOTHROID) 100 MCG tablet Take 100 mcg by mouth daily before breakfast.    . lisinopril (PRINIVIL,ZESTRIL) 5 MG tablet Take 1 tablet (5 mg total) by mouth at bedtime. 90 tablet 3  . loratadine (CLARITIN) 10 MG tablet Take 10 mg by mouth at bedtime.    . metoprolol tartrate (LOPRESSOR) 25 MG tablet Take 0.5 tablets (12.5 mg total) by mouth 2 (two) times daily. 30 tablet 11  . Multiple Vitamin (MULTIVITAMIN) tablet Take 1 tablet by mouth daily.      . nitroGLYCERIN (NITROSTAT) 0.4 MG SL tablet Place 1 tablet (0.4 mg total) under the tongue every 5 (five) minutes as needed for chest pain. 30 tablet 0  . Omega-3 Fatty Acids (FISH OIL) 1000 MG CAPS Take 1,000 mg by mouth daily.    Marland Kitchen oxyCODONE-acetaminophen (PERCOCET) 10-325 MG tablet Take 0.5 tablets by mouth 2 (two) times daily as needed for pain.    Marland Kitchen oxyCODONE-acetaminophen (PERCOCET/ROXICET) 5-325 MG per tablet  Take 1 tablet by mouth every 8 (eight) hours as needed for severe pain. (Patient taking differently: Take 1 tablet by mouth every 8 (eight) hours as needed for severe pain. Patient is taking 1/2 tablet by mouth every 8 hours as needed for severe pain) 30 tablet 0  . pravastatin (PRAVACHOL) 40 MG tablet Take 40 mg by mouth at bedtime.       No current facility-administered medications for this visit.     Past Medical History  Diagnosis Date  . Dizziness and giddiness   . Symptomatic bradycardia     a. Near syncope s/p Medtronic pacemaker 2008.  . Iron deficiency anemia, unspecified   . Hypothyroidism   . HTN (hypertension)   . Pure hypercholesterolemia   . Complication of anesthesia     ANXIETY  . Pacemaker   . Confusion Adm - 04/27/2013  . TIA (transient ischemic attack) "many of them"  . Overactive bladder   . Multiple falls     "over the last 6 months she's had 4" (02/28/2015)  . NSTEMI (non-ST elevated myocardial infarction) (HCC) 02/28/2015    a. Dx 02/2015 - treated medically.  . Osteoarthritis     "everywhere"  . Anxiety   . Depression   . Dementia     "early" (02/28/2015)  . History of stroke     a. Per chart.  . LV dysfunction  a. 2D Echo 03/01/15: poor image quality, septal HK, ? inferior wall as well, mild LVH, mild focal basal hypertrophy of septum, EF 45-50%, mildly dilated LA, PASP 32mmHg.  Marland Kitchen. Chronic combined systolic and diastolic CHF (congestive heart failure) (HCC)     ROS:   All systems reviewed and negative except as noted in the HPI.   Past Surgical History  Procedure Laterality Date  . Pacemaker insertion  01/05/07    Medtronic, dual chamber. Dr. Lewayne BuntingGregg Wilborn Membreno MD.  . Partial hip arthroplasty Bilateral   . Insert / replace / remove pacemaker    . Total knee arthroplasty Bilateral   . Cataract extraction, bilateral Bilateral 1960's  . Abdominal hysterectomy       Family History  Problem Relation Age of Onset  . Coronary artery disease Other   .  Stroke Other   . COPD Other   . Tuberculosis Mother      Social History   Social History  . Marital Status: Widowed    Spouse Name: N/A  . Number of Children: N/A  . Years of Education: N/A   Occupational History  . Not on file.   Social History Main Topics  . Smoking status: Never Smoker   . Smokeless tobacco: Never Used  . Alcohol Use: No  . Drug Use: No  . Sexual Activity: Not on file   Other Topics Concern  . Not on file   Social History Narrative   Lives with her husband.      BP 110/62 mmHg  Pulse 64  Ht 5\' 2"  (1.575 m)  Wt 146 lb (66.225 kg)  BMI 26.70 kg/m2  Physical Exam:  elderly appearing 81 year old woman, NAD HEENT: Unremarkable Neck:  6 cm JVD, no thyromegally Back:  No CVA tenderness Lungs:  Clear, with no wheezes, rales, or rhonchi. HEART:  Regular rate rhythm, no murmurs, no rubs, no clicks Abd:  soft, positive bowel sounds, no organomegally, no rebound, no guarding Ext:  2 plus pulses, no edema, no cyanosis, no clubbing Skin:  No rashes no nodules Neuro:  CN II through XII intact, motor grossly intact   DEVICE  Normal device function.  See PaceArt for details.   Assess/Plan:

## 2015-08-21 ENCOUNTER — Telehealth: Payer: Self-pay | Admitting: Internal Medicine

## 2015-08-21 MED ORDER — CLOPIDOGREL BISULFATE 75 MG PO TABS
75.0000 mg | ORAL_TABLET | Freq: Every day | ORAL | Status: DC
Start: 2015-08-21 — End: 2016-09-11

## 2015-08-21 MED ORDER — FUROSEMIDE 20 MG PO TABS: 20.0000 mg | ORAL_TABLET | Freq: Every day | ORAL | Status: DC

## 2015-08-21 NOTE — Telephone Encounter (Signed)
Please call,question about her medicine. °

## 2015-08-21 NOTE — Telephone Encounter (Signed)
Returned call to granddaughter and she is needing refills for patient for her Furosemide and Plavix.  I have called both in with a 90 day supply.

## 2015-09-05 ENCOUNTER — Telehealth: Payer: Self-pay | Admitting: Cardiology

## 2015-09-05 ENCOUNTER — Encounter: Payer: Medicare Other | Admitting: *Deleted

## 2015-09-05 NOTE — Telephone Encounter (Signed)
Attempted to confirm remote transmission with pt. No answer and was unable to leave a message.   

## 2015-09-08 ENCOUNTER — Encounter: Payer: Self-pay | Admitting: Cardiology

## 2015-09-18 ENCOUNTER — Ambulatory Visit (INDEPENDENT_AMBULATORY_CARE_PROVIDER_SITE_OTHER): Payer: Medicare Other | Admitting: *Deleted

## 2015-09-18 DIAGNOSIS — I495 Sick sinus syndrome: Secondary | ICD-10-CM | POA: Diagnosis not present

## 2015-09-18 DIAGNOSIS — Z950 Presence of cardiac pacemaker: Secondary | ICD-10-CM

## 2015-09-18 NOTE — Progress Notes (Signed)
Remote pacemaker transmission.   

## 2015-10-20 LAB — CUP PACEART REMOTE DEVICE CHECK
Battery Impedance: 3101 Ohm
Battery Voltage: 2.71 V
Brady Statistic AP VP Percent: 21 %
Brady Statistic AP VS Percent: 64 %
Brady Statistic AS VP Percent: 7 %
Date Time Interrogation Session: 20170402194928
Implantable Lead Implant Date: 20080721
Implantable Lead Model: 5076
Implantable Lead Model: 5076
Lead Channel Impedance Value: 612 Ohm
Lead Channel Impedance Value: 734 Ohm
Lead Channel Pacing Threshold Amplitude: 0.375 V
Lead Channel Pacing Threshold Pulse Width: 0.4 ms
Lead Channel Pacing Threshold Pulse Width: 0.4 ms
Lead Channel Setting Pacing Amplitude: 2 V
MDC IDC LEAD IMPLANT DT: 20080721
MDC IDC LEAD LOCATION: 753859
MDC IDC LEAD LOCATION: 753860
MDC IDC MSMT BATTERY REMAINING LONGEVITY: 17 mo
MDC IDC MSMT LEADCHNL RV PACING THRESHOLD AMPLITUDE: 0.75 V
MDC IDC MSMT LEADCHNL RV SENSING INTR AMPL: 8 mV
MDC IDC SET LEADCHNL RA PACING AMPLITUDE: 1.5 V
MDC IDC SET LEADCHNL RV PACING PULSEWIDTH: 0.4 ms
MDC IDC SET LEADCHNL RV SENSING SENSITIVITY: 5.6 mV
MDC IDC STAT BRADY AS VS PERCENT: 7 %

## 2015-10-25 ENCOUNTER — Encounter: Payer: Self-pay | Admitting: Cardiology

## 2015-11-30 DIAGNOSIS — Z0100 Encounter for examination of eyes and vision without abnormal findings: Secondary | ICD-10-CM | POA: Diagnosis not present

## 2015-11-30 DIAGNOSIS — H353131 Nonexudative age-related macular degeneration, bilateral, early dry stage: Secondary | ICD-10-CM | POA: Diagnosis not present

## 2015-12-11 DIAGNOSIS — I10 Essential (primary) hypertension: Secondary | ICD-10-CM | POA: Diagnosis not present

## 2015-12-11 DIAGNOSIS — E7800 Pure hypercholesterolemia, unspecified: Secondary | ICD-10-CM | POA: Diagnosis not present

## 2015-12-11 DIAGNOSIS — E039 Hypothyroidism, unspecified: Secondary | ICD-10-CM | POA: Diagnosis not present

## 2015-12-11 DIAGNOSIS — Z9181 History of falling: Secondary | ICD-10-CM | POA: Diagnosis not present

## 2015-12-18 ENCOUNTER — Telehealth: Payer: Self-pay | Admitting: Cardiology

## 2015-12-18 ENCOUNTER — Ambulatory Visit (INDEPENDENT_AMBULATORY_CARE_PROVIDER_SITE_OTHER): Payer: Medicare Other | Admitting: *Deleted

## 2015-12-18 DIAGNOSIS — I495 Sick sinus syndrome: Secondary | ICD-10-CM

## 2015-12-18 DIAGNOSIS — Z950 Presence of cardiac pacemaker: Secondary | ICD-10-CM | POA: Diagnosis not present

## 2015-12-18 NOTE — Progress Notes (Signed)
Remote pacemaker transmission.   

## 2015-12-18 NOTE — Telephone Encounter (Signed)
Spoke with pt and reminded pt of remote transmission that is due today. Pt verbalized understanding.   

## 2015-12-21 LAB — CUP PACEART REMOTE DEVICE CHECK
Battery Impedance: 3343 Ohm
Battery Remaining Longevity: 15 mo
Battery Voltage: 2.71 V
Brady Statistic AP VS Percent: 68 %
Brady Statistic AS VS Percent: 7 %
Date Time Interrogation Session: 20170703183316
Implantable Lead Implant Date: 20080721
Implantable Lead Location: 753859
Implantable Lead Model: 5076
Lead Channel Impedance Value: 488 Ohm
Lead Channel Impedance Value: 594 Ohm
Lead Channel Pacing Threshold Pulse Width: 0.4 ms
Lead Channel Setting Pacing Amplitude: 1.5 V
Lead Channel Setting Pacing Amplitude: 2 V
Lead Channel Setting Pacing Pulse Width: 0.4 ms
Lead Channel Setting Sensing Sensitivity: 4 mV
MDC IDC LEAD IMPLANT DT: 20080721
MDC IDC LEAD LOCATION: 753860
MDC IDC MSMT LEADCHNL RA PACING THRESHOLD AMPLITUDE: 0.375 V
MDC IDC MSMT LEADCHNL RA PACING THRESHOLD PULSEWIDTH: 0.4 ms
MDC IDC MSMT LEADCHNL RV PACING THRESHOLD AMPLITUDE: 1 V
MDC IDC MSMT LEADCHNL RV SENSING INTR AMPL: 8 mV
MDC IDC STAT BRADY AP VP PERCENT: 19 %
MDC IDC STAT BRADY AS VP PERCENT: 6 %

## 2015-12-22 ENCOUNTER — Encounter: Payer: Self-pay | Admitting: Cardiology

## 2016-02-28 ENCOUNTER — Telehealth: Payer: Self-pay | Admitting: Internal Medicine

## 2016-02-28 DIAGNOSIS — S300XXA Contusion of lower back and pelvis, initial encounter: Secondary | ICD-10-CM | POA: Diagnosis not present

## 2016-02-28 DIAGNOSIS — R05 Cough: Secondary | ICD-10-CM | POA: Diagnosis not present

## 2016-02-28 DIAGNOSIS — I10 Essential (primary) hypertension: Secondary | ICD-10-CM | POA: Diagnosis not present

## 2016-02-28 DIAGNOSIS — H9203 Otalgia, bilateral: Secondary | ICD-10-CM | POA: Diagnosis not present

## 2016-02-28 NOTE — Telephone Encounter (Signed)
New Message:    Her medical doctor have changed her from Lisinopril to Losartan. Is this alright with Dr Delia Headyaylr,he was the one who put her on Lisinopril,

## 2016-02-29 NOTE — Telephone Encounter (Signed)
Lisinopril was stopped due to cough and Losartan was started. Granddaughter awre okay to take

## 2016-03-18 ENCOUNTER — Telehealth: Payer: Self-pay | Admitting: Cardiology

## 2016-03-18 ENCOUNTER — Encounter: Payer: Medicare Other | Admitting: *Deleted

## 2016-03-18 NOTE — Telephone Encounter (Signed)
Confirmed remote transmission w/ pt daughter.   

## 2016-03-22 ENCOUNTER — Encounter: Payer: Self-pay | Admitting: Cardiology

## 2016-03-28 ENCOUNTER — Ambulatory Visit (INDEPENDENT_AMBULATORY_CARE_PROVIDER_SITE_OTHER): Payer: Medicare Other | Admitting: *Deleted

## 2016-03-28 DIAGNOSIS — I495 Sick sinus syndrome: Secondary | ICD-10-CM | POA: Diagnosis not present

## 2016-03-28 NOTE — Progress Notes (Signed)
Remote pacemaker transmission.   

## 2016-03-29 ENCOUNTER — Encounter: Payer: Self-pay | Admitting: Cardiology

## 2016-04-29 LAB — CUP PACEART REMOTE DEVICE CHECK
Battery Remaining Longevity: 11 mo
Battery Voltage: 2.69 V
Brady Statistic AP VS Percent: 74 %
Date Time Interrogation Session: 20171012162849
Implantable Lead Implant Date: 20080721
Implantable Lead Location: 753860
Lead Channel Pacing Threshold Pulse Width: 0.4 ms
Lead Channel Setting Pacing Amplitude: 1.5 V
Lead Channel Setting Pacing Pulse Width: 0.4 ms
MDC IDC LEAD IMPLANT DT: 20080721
MDC IDC LEAD LOCATION: 753859
MDC IDC MSMT BATTERY IMPEDANCE: 4061 Ohm
MDC IDC MSMT LEADCHNL RA IMPEDANCE VALUE: 680 Ohm
MDC IDC MSMT LEADCHNL RA PACING THRESHOLD AMPLITUDE: 0.375 V
MDC IDC MSMT LEADCHNL RA PACING THRESHOLD PULSEWIDTH: 0.4 ms
MDC IDC MSMT LEADCHNL RV IMPEDANCE VALUE: 630 Ohm
MDC IDC MSMT LEADCHNL RV PACING THRESHOLD AMPLITUDE: 0.75 V
MDC IDC PG IMPLANT DT: 20080721
MDC IDC SET LEADCHNL RV PACING AMPLITUDE: 2 V
MDC IDC SET LEADCHNL RV SENSING SENSITIVITY: 5.6 mV
MDC IDC STAT BRADY AP VP PERCENT: 14 %
MDC IDC STAT BRADY AS VP PERCENT: 4 %
MDC IDC STAT BRADY AS VS PERCENT: 8 %

## 2016-04-29 NOTE — Progress Notes (Signed)
Normal remote reviewed. AP/VS interval ~43300msec 11 months longevity.  Mode switch episodes are frequent atrial ectopy  Next follow up 06/03/16 with GT.  Will need monthly Carelink transmissions at that visit.

## 2016-05-29 ENCOUNTER — Encounter: Payer: Self-pay | Admitting: Internal Medicine

## 2016-06-03 ENCOUNTER — Encounter: Payer: Self-pay | Admitting: Internal Medicine

## 2016-06-03 ENCOUNTER — Ambulatory Visit (INDEPENDENT_AMBULATORY_CARE_PROVIDER_SITE_OTHER): Payer: Medicare Other | Admitting: Internal Medicine

## 2016-06-03 VITALS — BP 155/90 | HR 82 | Ht 64.0 in | Wt 135.4 lb

## 2016-06-03 DIAGNOSIS — I10 Essential (primary) hypertension: Secondary | ICD-10-CM | POA: Diagnosis not present

## 2016-06-03 DIAGNOSIS — Z950 Presence of cardiac pacemaker: Secondary | ICD-10-CM | POA: Diagnosis not present

## 2016-06-03 DIAGNOSIS — I495 Sick sinus syndrome: Secondary | ICD-10-CM

## 2016-06-03 LAB — CUP PACEART INCLINIC DEVICE CHECK
Battery Impedance: 4598 Ohm
Battery Voltage: 2.64 V
Brady Statistic AP VS Percent: 75 %
Implantable Lead Implant Date: 20080721
Implantable Lead Model: 5076
Implantable Lead Model: 5076
Lead Channel Impedance Value: 619 Ohm
Lead Channel Impedance Value: 759 Ohm
Lead Channel Pacing Threshold Amplitude: 0.5 V
Lead Channel Pacing Threshold Pulse Width: 0.4 ms
Lead Channel Sensing Intrinsic Amplitude: 2 mV
Lead Channel Setting Pacing Amplitude: 1.5 V
MDC IDC LEAD IMPLANT DT: 20080721
MDC IDC LEAD LOCATION: 753859
MDC IDC LEAD LOCATION: 753860
MDC IDC MSMT BATTERY REMAINING LONGEVITY: 8 mo
MDC IDC MSMT LEADCHNL RA PACING THRESHOLD PULSEWIDTH: 0.4 ms
MDC IDC MSMT LEADCHNL RV PACING THRESHOLD AMPLITUDE: 0.75 V
MDC IDC MSMT LEADCHNL RV SENSING INTR AMPL: 15.67 mV
MDC IDC PG IMPLANT DT: 20080721
MDC IDC SESS DTM: 20171218172045
MDC IDC SET LEADCHNL RV PACING AMPLITUDE: 2.25 V
MDC IDC SET LEADCHNL RV PACING PULSEWIDTH: 0.4 ms
MDC IDC SET LEADCHNL RV SENSING SENSITIVITY: 5.6 mV
MDC IDC STAT BRADY AP VP PERCENT: 12 %
MDC IDC STAT BRADY AS VP PERCENT: 3 %
MDC IDC STAT BRADY AS VS PERCENT: 9 %

## 2016-06-03 NOTE — Progress Notes (Signed)
HPI [name] returns today for follow-up. She is a very pleasant 82 year old woman with a history of symptomatic bradycardia, status post pacemaker insertion, hypertension, with labile blood pressures. Her son who is with her today notes that her blood pressure has been good at home with systolics in the 120's. She denies peripheral edema. Minimal dyspnea though she is becoming more inactive. No Known Allergies   Current Outpatient Prescriptions  Medication Sig Dispense Refill  . aspirin 81 MG chewable tablet Chew 1 tablet (81 mg total) by mouth daily.    . citalopram (CELEXA) 20 MG tablet Take 20 mg by mouth daily.     . clopidogrel (PLAVIX) 75 MG tablet Take 1 tablet (75 mg total) by mouth daily. 90 tablet 3  . furosemide (LASIX) 20 MG tablet Take 1 tablet (20 mg total) by mouth daily. 90 tablet 3  . gabapentin (NEURONTIN) 100 MG capsule Take 100 mg by mouth daily.    Marland Kitchen. levothyroxine (SYNTHROID, LEVOTHROID) 100 MCG tablet Take 100 mcg by mouth daily before breakfast.    . lisinopril (PRINIVIL,ZESTRIL) 5 MG tablet Take 1 tablet (5 mg total) by mouth at bedtime. 90 tablet 3  . loratadine (CLARITIN) 10 MG tablet Take 10 mg by mouth at bedtime.    . metoprolol tartrate (LOPRESSOR) 25 MG tablet Take 0.5 tablets (12.5 mg total) by mouth 2 (two) times daily. 30 tablet 11  . Multiple Vitamin (MULTIVITAMIN) tablet Take 1 tablet by mouth daily.      . nitroGLYCERIN (NITROSTAT) 0.4 MG SL tablet Place 1 tablet (0.4 mg total) under the tongue every 5 (five) minutes as needed for chest pain. 30 tablet 0  . Omega-3 Fatty Acids (FISH OIL) 1000 MG CAPS Take 1,000 mg by mouth daily.    Marland Kitchen. oxyCODONE-acetaminophen (PERCOCET) 10-325 MG tablet Take 0.5 tablets by mouth 2 (two) times daily as needed for pain.    Marland Kitchen. oxyCODONE-acetaminophen (PERCOCET/ROXICET) 5-325 MG per tablet Take 1 tablet by mouth every 8 (eight) hours as needed for severe pain. (Patient taking differently: Take 1 tablet by mouth every 8  (eight) hours as needed for severe pain. Patient is taking 1/2 tablet by mouth every 8 hours as needed for severe pain) 30 tablet 0  . pravastatin (PRAVACHOL) 40 MG tablet Take 40 mg by mouth at bedtime.       No current facility-administered medications for this visit.      Past Medical History:  Diagnosis Date  . Anxiety   . Chronic combined systolic and diastolic CHF (congestive heart failure) (HCC)   . Complication of anesthesia    ANXIETY  . Confusion Adm - 04/27/2013  . Dementia    "early" (02/28/2015)  . Depression   . Dizziness and giddiness   . History of stroke    a. Per chart.  Marland Kitchen. HTN (hypertension)   . Hypothyroidism   . Iron deficiency anemia, unspecified   . LV dysfunction    a. 2D Echo 03/01/15: poor image quality, septal HK, ? inferior wall as well, mild LVH, mild focal basal hypertrophy of septum, EF 45-50%, mildly dilated LA, PASP 32mmHg.  . Multiple falls    "over the last 6 months she's had 4" (02/28/2015)  . NSTEMI (non-ST elevated myocardial infarction) (HCC) 02/28/2015   a. Dx 02/2015 - treated medically.  . Osteoarthritis    "everywhere"  . Overactive bladder   . Pacemaker   . Pure hypercholesterolemia   . Symptomatic bradycardia    a.  Near syncope s/p Medtronic pacemaker 2008.  Marland Kitchen. TIA (transient ischemic attack) "many of them"    ROS:   All systems reviewed and negative except as noted in the HPI.   Past Surgical History:  Procedure Laterality Date  . ABDOMINAL HYSTERECTOMY    . CATARACT EXTRACTION, BILATERAL Bilateral 1960's  . INSERT / REPLACE / REMOVE PACEMAKER    . PACEMAKER INSERTION  01/05/07   Medtronic, dual chamber. Dr. Lewayne BuntingGregg Taylor MD.  . PARTIAL HIP ARTHROPLASTY Bilateral   . TOTAL KNEE ARTHROPLASTY Bilateral      Family History  Problem Relation Age of Onset  . Tuberculosis Mother   . Coronary artery disease Other   . Stroke Other   . COPD Other      Social History   Social History  . Marital status: Widowed    Spouse  name: N/A  . Number of children: N/A  . Years of education: N/A   Occupational History  . Not on file.   Social History Main Topics  . Smoking status: Never Smoker  . Smokeless tobacco: Never Used  . Alcohol use No  . Drug use: No  . Sexual activity: Not on file   Other Topics Concern  . Not on file   Social History Narrative   Lives with her husband.      BP (!) 155/90   Pulse 82   Ht 5\' 4"  (1.626 m)   Wt 135 lb 6.4 oz (61.4 kg)   SpO2 95%   BMI 23.24 kg/m   Physical Exam:  elderly appearing 82 year old woman, NAD HEENT: Unremarkable Neck:  6 cm JVD, no thyromegally Back:  No CVA tenderness Lungs:  Clear, with no wheezes, rales, or rhonchi. HEART:  Regular rate rhythm, no murmurs, no rubs, no clicks Abd:  soft, positive bowel sounds, no organomegally, no rebound, no guarding Ext:  2 plus pulses, no edema, no cyanosis, no clubbing Skin:  No rashes no nodules Neuro:  CN II through XII intact, motor grossly intact   DEVICE  Normal device function.  See PaceArt for details. Approaching ERI.  Assess/Plan: 1. Sinus node dysfunction - she is maintaining NSR very nicely. No change in meds. 2. PPM - her Medtronic DDD PM is working normally. Will recheck in several months. She is approaching ERI. 3. HTN - her blood pressure today is high but her son notes that it is well controlled at home.  Leonia ReevesGregg Taylor,M.D.

## 2016-06-03 NOTE — Patient Instructions (Signed)
Medication Instructions: Your physician recommends that you continue on your current medications as directed. Please refer to the Current Medication list given to you today.   Labwork: None Ordered  Procedures/Testing: None Ordered  Follow-Up: Your physician wants you to follow-up in: 1 YEAR with Dr. Taylor. You will receive a reminder letter in the mail two months in advance. If you don't receive a letter, please call our office to schedule the follow-up appointment.   Any Additional Special Instructions Will Be Listed Below (If Applicable).     If you need a refill on your cardiac medications before your next appointment, please call your pharmacy.   

## 2016-08-26 ENCOUNTER — Encounter (HOSPITAL_COMMUNITY): Payer: Self-pay | Admitting: Emergency Medicine

## 2016-08-26 ENCOUNTER — Emergency Department (HOSPITAL_COMMUNITY): Payer: Medicare Other

## 2016-08-26 ENCOUNTER — Inpatient Hospital Stay (HOSPITAL_COMMUNITY)
Admission: EM | Admit: 2016-08-26 | Discharge: 2016-08-28 | DRG: 259 | Disposition: A | Discharge status: Home Health | Payer: Medicare Other | Attending: Internal Medicine | Admitting: Internal Medicine

## 2016-08-26 DIAGNOSIS — I5041 Acute combined systolic (congestive) and diastolic (congestive) heart failure: Secondary | ICD-10-CM | POA: Diagnosis present

## 2016-08-26 DIAGNOSIS — F329 Major depressive disorder, single episode, unspecified: Secondary | ICD-10-CM | POA: Diagnosis present

## 2016-08-26 DIAGNOSIS — Z96643 Presence of artificial hip joint, bilateral: Secondary | ICD-10-CM | POA: Diagnosis present

## 2016-08-26 DIAGNOSIS — I110 Hypertensive heart disease with heart failure: Secondary | ICD-10-CM | POA: Diagnosis present

## 2016-08-26 DIAGNOSIS — I5031 Acute diastolic (congestive) heart failure: Secondary | ICD-10-CM | POA: Diagnosis not present

## 2016-08-26 DIAGNOSIS — R410 Disorientation, unspecified: Secondary | ICD-10-CM | POA: Diagnosis present

## 2016-08-26 DIAGNOSIS — Z45010 Encounter for checking and testing of cardiac pacemaker pulse generator [battery]: Secondary | ICD-10-CM | POA: Diagnosis not present

## 2016-08-26 DIAGNOSIS — E039 Hypothyroidism, unspecified: Secondary | ICD-10-CM | POA: Diagnosis present

## 2016-08-26 DIAGNOSIS — Z96653 Presence of artificial knee joint, bilateral: Secondary | ICD-10-CM | POA: Diagnosis present

## 2016-08-26 DIAGNOSIS — F0390 Unspecified dementia without behavioral disturbance: Secondary | ICD-10-CM | POA: Diagnosis present

## 2016-08-26 DIAGNOSIS — E785 Hyperlipidemia, unspecified: Secondary | ICD-10-CM | POA: Diagnosis present

## 2016-08-26 DIAGNOSIS — Z7982 Long term (current) use of aspirin: Secondary | ICD-10-CM | POA: Diagnosis not present

## 2016-08-26 DIAGNOSIS — I5043 Acute on chronic combined systolic (congestive) and diastolic (congestive) heart failure: Secondary | ICD-10-CM

## 2016-08-26 DIAGNOSIS — Z79899 Other long term (current) drug therapy: Secondary | ICD-10-CM

## 2016-08-26 DIAGNOSIS — R627 Adult failure to thrive: Secondary | ICD-10-CM | POA: Diagnosis present

## 2016-08-26 DIAGNOSIS — I252 Old myocardial infarction: Secondary | ICD-10-CM | POA: Diagnosis not present

## 2016-08-26 DIAGNOSIS — E876 Hypokalemia: Secondary | ICD-10-CM | POA: Diagnosis not present

## 2016-08-26 DIAGNOSIS — I509 Heart failure, unspecified: Secondary | ICD-10-CM | POA: Diagnosis not present

## 2016-08-26 DIAGNOSIS — I10 Essential (primary) hypertension: Secondary | ICD-10-CM

## 2016-08-26 DIAGNOSIS — R296 Repeated falls: Secondary | ICD-10-CM | POA: Diagnosis present

## 2016-08-26 DIAGNOSIS — E7800 Pure hypercholesterolemia, unspecified: Secondary | ICD-10-CM | POA: Diagnosis present

## 2016-08-26 DIAGNOSIS — I495 Sick sinus syndrome: Secondary | ICD-10-CM

## 2016-08-26 DIAGNOSIS — Z8673 Personal history of transient ischemic attack (TIA), and cerebral infarction without residual deficits: Secondary | ICD-10-CM

## 2016-08-26 DIAGNOSIS — R630 Anorexia: Secondary | ICD-10-CM | POA: Diagnosis present

## 2016-08-26 DIAGNOSIS — Z7902 Long term (current) use of antithrombotics/antiplatelets: Secondary | ICD-10-CM

## 2016-08-26 DIAGNOSIS — R0602 Shortness of breath: Secondary | ICD-10-CM | POA: Diagnosis present

## 2016-08-26 LAB — COMPREHENSIVE METABOLIC PANEL
ALT: 17 U/L (ref 14–54)
AST: 27 U/L (ref 15–41)
Albumin: 3.3 g/dL — ABNORMAL LOW (ref 3.5–5.0)
Alkaline Phosphatase: 72 U/L (ref 38–126)
Anion gap: 10 (ref 5–15)
BUN: 14 mg/dL (ref 6–20)
CHLORIDE: 98 mmol/L — AB (ref 101–111)
CO2: 29 mmol/L (ref 22–32)
CREATININE: 0.74 mg/dL (ref 0.44–1.00)
Calcium: 8.7 mg/dL — ABNORMAL LOW (ref 8.9–10.3)
GFR calc Af Amer: >60 mL/min (ref >60)
GLUCOSE: 102 mg/dL — AB (ref 65–99)
POTASSIUM: 3.7 mmol/L (ref 3.5–5.1)
SODIUM: 137 mmol/L (ref 135–145)
Total Bilirubin: 1 mg/dL (ref 0.3–1.2)
Total Protein: 6.2 g/dL — ABNORMAL LOW (ref 6.5–8.1)

## 2016-08-26 LAB — CBC WITH DIFFERENTIAL/PLATELET
Basophils Absolute: 0 10*3/uL (ref 0.0–0.1)
Basophils Relative: 0 %
EOS ABS: 0.1 10*3/uL (ref 0.0–0.7)
EOS PCT: 2 %
HCT: 37 % (ref 36.0–46.0)
Hemoglobin: 11.9 g/dL — ABNORMAL LOW (ref 12.0–15.0)
LYMPHS ABS: 1.2 10*3/uL (ref 0.7–4.0)
LYMPHS PCT: 17 %
MCH: 30.7 pg (ref 26.0–34.0)
MCHC: 32.2 g/dL (ref 30.0–36.0)
MCV: 95.6 fL (ref 78.0–100.0)
MONO ABS: 0.9 10*3/uL (ref 0.1–1.0)
Monocytes Relative: 12 %
Neutro Abs: 5.1 10*3/uL (ref 1.7–7.7)
Neutrophils Relative %: 69 %
PLATELETS: 234 10*3/uL (ref 150–400)
RBC: 3.87 MIL/uL (ref 3.87–5.11)
RDW: 13.4 % (ref 11.5–15.5)
WBC: 7.3 10*3/uL (ref 4.0–10.5)

## 2016-08-26 LAB — I-STAT TROPONIN, ED: Troponin i, poc: 0.03 ng/mL (ref 0.00–0.08)

## 2016-08-26 LAB — I-STAT CG4 LACTIC ACID, ED: LACTIC ACID, VENOUS: 1.54 mmol/L (ref 0.5–1.9)

## 2016-08-26 LAB — BRAIN NATRIURETIC PEPTIDE: B NATRIURETIC PEPTIDE 5: 1890.8 pg/mL — AB (ref 0.0–100.0)

## 2016-08-26 MED ORDER — ONE-DAILY MULTI VITAMINS PO TABS: 1.0000 | ORAL_TABLET | Freq: Every day | ORAL | Status: DC

## 2016-08-26 MED ORDER — SODIUM CHLORIDE 0.9 % IV SOLN
250.0000 mL | INTRAVENOUS | Status: DC | PRN
Start: 2016-08-26 — End: 2016-08-27

## 2016-08-26 MED ORDER — HEPARIN SODIUM (PORCINE) 5000 UNIT/ML IJ SOLN
5000.0000 [IU] | Freq: Three times a day (TID) | INTRAMUSCULAR | Status: DC
  Administered 2016-08-26 – 2016-08-27 (×2): 5000 [IU] via SUBCUTANEOUS
  Filled 2016-08-26 (×2): qty 1

## 2016-08-26 MED ORDER — CLOPIDOGREL BISULFATE 75 MG PO TABS
75.0000 mg | ORAL_TABLET | Freq: Every day | ORAL | Status: DC
Start: 2016-08-27 — End: 2016-08-28
  Administered 2016-08-27 – 2016-08-28 (×2): 75 mg via ORAL
  Filled 2016-08-26 (×2): qty 1

## 2016-08-26 MED ORDER — PRAVASTATIN SODIUM 40 MG PO TABS
40.0000 mg | ORAL_TABLET | Freq: Every day | ORAL | Status: DC
Start: 2016-08-26 — End: 2016-08-28
  Administered 2016-08-26 – 2016-08-27 (×2): 40 mg via ORAL
  Filled 2016-08-26 (×2): qty 1

## 2016-08-26 MED ORDER — ONDANSETRON HCL 4 MG/2ML IJ SOLN: 4.0000 mg | Freq: Four times a day (QID) | INTRAMUSCULAR | Status: DC | PRN

## 2016-08-26 MED ORDER — LEVOTHYROXINE SODIUM 112 MCG PO TABS
112.0000 ug | ORAL_TABLET | Freq: Every day | ORAL | Status: DC
  Administered 2016-08-27 – 2016-08-28 (×2): 112 ug via ORAL
  Filled 2016-08-26 (×2): qty 1

## 2016-08-26 MED ORDER — LORATADINE 10 MG PO TABS: 10.0000 mg | ORAL_TABLET | Freq: Every day | ORAL | Status: DC | PRN

## 2016-08-26 MED ORDER — CITALOPRAM HYDROBROMIDE 20 MG PO TABS
20.0000 mg | ORAL_TABLET | Freq: Every day | ORAL | Status: DC
  Administered 2016-08-27 – 2016-08-28 (×2): 20 mg via ORAL
  Filled 2016-08-26 (×2): qty 1

## 2016-08-26 MED ORDER — SODIUM CHLORIDE 0.9% FLUSH: 3.0000 mL | INTRAVENOUS | Status: DC | PRN

## 2016-08-26 MED ORDER — METOPROLOL TARTRATE 12.5 MG HALF TABLET
12.5000 mg | ORAL_TABLET | Freq: Two times a day (BID) | ORAL | Status: DC
  Administered 2016-08-26 – 2016-08-28 (×4): 12.5 mg via ORAL
  Filled 2016-08-26 (×4): qty 1

## 2016-08-26 MED ORDER — LOSARTAN POTASSIUM 25 MG PO TABS
25.0000 mg | ORAL_TABLET | Freq: Every day | ORAL | Status: DC
  Administered 2016-08-26 – 2016-08-27 (×2): 25 mg via ORAL
  Filled 2016-08-26 (×2): qty 1

## 2016-08-26 MED ORDER — FUROSEMIDE 10 MG/ML IJ SOLN
40.0000 mg | Freq: Two times a day (BID) | INTRAMUSCULAR | Status: DC
  Administered 2016-08-27 – 2016-08-28 (×3): 40 mg via INTRAVENOUS
  Filled 2016-08-26 (×3): qty 4

## 2016-08-26 MED ORDER — ACETAMINOPHEN 325 MG PO TABS: 650.0000 mg | ORAL_TABLET | ORAL | Status: DC | PRN

## 2016-08-26 MED ORDER — SODIUM CHLORIDE 0.9% FLUSH
3.0000 mL | Freq: Two times a day (BID) | INTRAVENOUS | Status: DC
  Administered 2016-08-26: 3 mL via INTRAVENOUS

## 2016-08-26 MED ORDER — ALPRAZOLAM 0.25 MG PO TABS
0.2500 mg | ORAL_TABLET | Freq: Every evening | ORAL | Status: DC | PRN
  Administered 2016-08-27: 0.25 mg via ORAL
  Filled 2016-08-26: qty 1

## 2016-08-26 MED ORDER — ADULT MULTIVITAMIN W/MINERALS CH
1.0000 | ORAL_TABLET | Freq: Every day | ORAL | Status: DC
  Administered 2016-08-27 – 2016-08-28 (×2): 1 via ORAL
  Filled 2016-08-26 (×2): qty 1

## 2016-08-26 MED ORDER — OXYCODONE-ACETAMINOPHEN 5-325 MG PO TABS
0.5000 | ORAL_TABLET | Freq: Every evening | ORAL | Status: DC | PRN
  Administered 2016-08-26 – 2016-08-27 (×2): 0.5 via ORAL
  Filled 2016-08-26 (×2): qty 1

## 2016-08-26 MED ORDER — ASPIRIN 81 MG PO CHEW
81.0000 mg | CHEWABLE_TABLET | Freq: Every day | ORAL | Status: DC
  Administered 2016-08-27 – 2016-08-28 (×2): 81 mg via ORAL
  Filled 2016-08-26 (×2): qty 1

## 2016-08-26 NOTE — ED Provider Notes (Signed)
[facility]-EMERGENCY DEPT Provider Note   CSN: [id number] Arrival date & time: 08/26/16  1134     History   Chief Complaint Chief Complaint  Patient presents with  . Shortness of Breath  . Cough    HPI [name] is a 83 y.o. female who presents with progressively worsening SOB. PMH significant for CHF EF 45-50% (Echo 2016), hx of bradycardia s/p pacemaker, hx of CVA, dementia. She lives at home and has 24 hour care from her sons. Her family states she has been more SOB with exertion/ADLs over the past 3 days. Today she was complaining of it more and therefore they brought her here to be checked. She has a cough as well which is mostly dry. Also has decreased PO intake due to SOB. She denies fever, chest pain, wheezing, abdominal pain, N/V. She has been compliant with her medicines. Dry weight is ~130-140. They state she is mostly inactive and stays in bed for the most part.  HPI  Past Medical History:  Diagnosis Date  . Anxiety   . Chronic combined systolic and diastolic CHF (congestive heart failure) (HCC)   . Complication of anesthesia    ANXIETY  . Confusion Adm - 04/27/2013  . Dementia    "early" (02/28/2015)  . Depression   . Dizziness and giddiness   . History of stroke    a. Per chart.  Marland Kitchen. HTN (hypertension)   . Hypothyroidism   . Iron deficiency anemia, unspecified   . LV dysfunction    a. 2D Echo 03/01/15: poor image quality, septal HK, ? inferior wall as well, mild LVH, mild focal basal hypertrophy of septum, EF 45-50%, mildly dilated LA, PASP 32mmHg.  . Multiple falls    "over the last 6 months she's had 4" (02/28/2015)  . NSTEMI (non-ST elevated myocardial infarction) (HCC) 02/28/2015   a. Dx 02/2015 - treated medically.  . Osteoarthritis    "everywhere"  . Overactive bladder   . Pacemaker   . Pure hypercholesterolemia   . Symptomatic bradycardia    a. Near syncope s/p Medtronic pacemaker 2008.  Marland Kitchen. TIA (transient ischemic attack) "many of them"    Patient  Active Problem List   Diagnosis Date Noted  . Chronic combined systolic and diastolic CHF (congestive heart failure) (HCC)   . UTI (lower urinary tract infection) 02/28/2015  . NSTEMI (non-ST elevated myocardial infarction) (HCC) 02/28/2015  . SOB (shortness of breath) 02/28/2015  . Hypokalemia 02/28/2015  . Chronic diastolic CHF (congestive heart failure) (HCC) 06/13/2014  . Chest discomfort 06/01/2014  . Dementia 06/01/2014  . Sinus node dysfunction s/p Medtronic pacemaker 2008 06/01/2014  . Confusion 04/27/2013  . H/O: CVA (cerebrovascular accident) 04/27/2013  . Hypothyroidism 02/24/2009  . HYPERCHOLESTEROLEMIA 02/24/2009  . Iron deficiency anemia 02/24/2009  . Essential hypertension 02/24/2009  . BRADYCARDIA 02/24/2009  . OSTEOARTHRITIS 02/24/2009  . DIZZINESS 02/24/2009  . PACEMAKER, PERMANENT 02/24/2009    Past Surgical History:  Procedure Laterality Date  . ABDOMINAL HYSTERECTOMY    . CATARACT EXTRACTION, BILATERAL Bilateral 1960's  . INSERT / REPLACE / REMOVE PACEMAKER    . PACEMAKER INSERTION  01/05/07   Medtronic, dual chamber. Dr. Lewayne BuntingGregg Taylor MD.  . PARTIAL HIP ARTHROPLASTY Bilateral   . TOTAL KNEE ARTHROPLASTY Bilateral     OB History    No data available       Home Medications    Prior to Admission medications   Medication Sig Start Date End Date Taking? Authorizing Provider  aspirin 81 MG  chewable tablet Chew 1 tablet (81 mg total) by mouth daily. 03/02/15   Vassie Loll, MD  citalopram (CELEXA) 20 MG tablet Take 20 mg by mouth daily.     Historical Provider, MD  clopidogrel (PLAVIX) 75 MG tablet Take 1 tablet (75 mg total) by mouth daily. 08/21/15   Marinus Maw, MD  furosemide (LASIX) 20 MG tablet Take 1 tablet (20 mg total) by mouth daily. 08/21/15   Marinus Maw, MD  gabapentin (NEURONTIN) 100 MG capsule Take 100 mg by mouth daily.    Historical Provider, MD  levothyroxine (SYNTHROID, LEVOTHROID) 100 MCG tablet Take 100 mcg by mouth daily before  breakfast.    Historical Provider, MD  lisinopril (PRINIVIL,ZESTRIL) 5 MG tablet Take 1 tablet (5 mg total) by mouth at bedtime. 06/06/15   Marinus Maw, MD  loratadine (CLARITIN) 10 MG tablet Take 10 mg by mouth at bedtime.    Historical Provider, MD  metoprolol tartrate (LOPRESSOR) 25 MG tablet Take 0.5 tablets (12.5 mg total) by mouth 2 (two) times daily. 06/01/15   Marinus Maw, MD  Multiple Vitamin (MULTIVITAMIN) tablet Take 1 tablet by mouth daily.      Historical Provider, MD  nitroGLYCERIN (NITROSTAT) 0.4 MG SL tablet Place 1 tablet (0.4 mg total) under the tongue every 5 (five) minutes as needed for chest pain. 06/01/14   Roxy Horseman, PA-C  Omega-3 Fatty Acids (FISH OIL) 1000 MG CAPS Take 1,000 mg by mouth daily.    Historical Provider, MD  oxyCODONE-acetaminophen (PERCOCET) 10-325 MG tablet Take 0.5 tablets by mouth 2 (two) times daily as needed for pain.    Historical Provider, MD  oxyCODONE-acetaminophen (PERCOCET/ROXICET) 5-325 MG per tablet Take 1 tablet by mouth every 8 (eight) hours as needed for severe pain. Patient taking differently: Take 1 tablet by mouth every 8 (eight) hours as needed for severe pain. Patient is taking 1/2 tablet by mouth every 8 hours as needed for severe pain 03/02/15   Vassie Loll, MD  pravastatin (PRAVACHOL) 40 MG tablet Take 40 mg by mouth at bedtime.      Historical Provider, MD    Family History Family History  Problem Relation Age of Onset  . Tuberculosis Mother   . Coronary artery disease Other   . Stroke Other   . COPD Other     Social History Social History  Substance Use Topics  . Smoking status: Never Smoker  . Smokeless tobacco: Never Used  . Alcohol use No     Allergies   Patient has no known allergies.   Review of Systems Review of Systems  Constitutional: Positive for appetite change. Negative for fever.  Respiratory: Positive for cough and shortness of breath. Negative for wheezing.   Cardiovascular: Negative for  chest pain, palpitations and leg swelling.  Gastrointestinal: Negative for abdominal pain, nausea and vomiting.  Neurological: Negative for syncope.  All other systems reviewed and are negative.    Physical Exam Updated Vital Signs BP 167/91   Pulse 68   Temp 98.6 F (37 C)   Resp 20   SpO2 95%   Physical Exam  Constitutional: She is oriented to person, place, and time. She appears well-developed and well-nourished. No distress.  HENT:  Head: Normocephalic and atraumatic.  Eyes: Conjunctivae are normal. Pupils are equal, round, and reactive to light. Right eye exhibits no discharge. Left eye exhibits no discharge. No scleral icterus.  Neck: Normal range of motion.  Cardiovascular: Normal rate, regular rhythm and normal pulses.  Exam reveals no gallop and no friction rub.   No murmur heard. Pulmonary/Chest: Effort normal and breath sounds normal. No respiratory distress. She has no wheezes. She has no rales. She exhibits no tenderness.  Abdominal: Soft. Bowel sounds are normal. She exhibits no distension and no mass. There is no tenderness. There is no rebound and no guarding. No hernia.  Musculoskeletal:  Trace bilateral peripheral edema  Neurological: She is alert and oriented to person, place, and time.  Skin: Skin is warm and dry.  Psychiatric: She has a normal mood and affect. Her behavior is normal.  Nursing note and vitals reviewed.    ED Treatments / Results  Labs (all labs ordered are listed, but only abnormal results are displayed) Labs Reviewed  COMPREHENSIVE METABOLIC PANEL - Abnormal; Notable for the following:       Result Value   Chloride 98 (*)    Glucose, Bld 102 (*)    Calcium 8.7 (*)    Total Protein 6.2 (*)    Albumin 3.3 (*)    All other components within normal limits  CBC WITH DIFFERENTIAL/PLATELET - Abnormal; Notable for the following:    Hemoglobin 11.9 (*)    All other components within normal limits  URINALYSIS, ROUTINE W REFLEX MICROSCOPIC    BRAIN NATRIURETIC PEPTIDE  I-STAT CG4 LACTIC ACID, ED  Rosezena Sensor, ED    EKG  EKG Interpretation  Date/Time:  Monday August 26 2016 11:44:15 EDT Ventricular Rate:  72 PR Interval:    QRS Duration: 160 QT Interval:  502 QTC Calculation: 549 R Axis:   -73 Text Interpretation:  Ventricular-paced rhythm with frequent Premature ventricular complexes Abnormal ECG Since last EKG: Rhythm is now ventricularly paced, rather than atrial paced PVCs are now present Confirmed by ISAACS MD, Sheria Lang [phone]) on 08/26/2016 3:17:12 PM       Radiology Dg Chest 2 View  Result Date: 08/26/2016 CLINICAL DATA:  Cough, shortness of breath for 3 days. EXAM: CHEST  2 VIEW COMPARISON:  02/28/2015 FINDINGS: Left pacer remains in place, unchanged. There is cardiomegaly. Left lower lobe atelectasis or scarring. No visible effusions. No acute bony abnormality. IMPRESSION: Cardiomegaly.  Left base scarring or atelectasis. Electronically Signed   By: Charlett Nose M.D.   On: 08/26/2016 12:45    Procedures Procedures (including critical care time)  Medications Ordered in ED Medications - No data to display   Initial Impression / Assessment and Plan / ED Course  I have reviewed the triage vital signs and the nursing notes.  Pertinent labs & imaging results that were available during my care of the patient were reviewed by me and considered in my medical decision making (see chart for details).  83 year old female presents with worsening SOB. May be multifactorial due to CHF and her pacemaker. Pacemaker was interrogated and apparently it went in to power saving mode on 3/3 and lost AV synchrony. She is due for pacemaker replacement. She is hypertensive but otherwise vitals are normal. CBC unremarkable. CMP overall unremarkable. Troponin is 0.03. Lactic acid is 1.54. BNP is markedly elevated. It is 1890 today (was in 500s a year ago) although she does not appear significantly volume overloaded on exam. CXR was  clear. EKG remarkable for ventricular paced rhythm with PVCs. Spoke with Dr. [name] who will admit patient for diuresis.   Final Clinical Impressions(s) / ED Diagnoses   Final diagnoses:  Acute on chronic combined systolic and diastolic congestive heart failure (HCC)  Pacemaker at end of  battery life    New Prescriptions New Prescriptions   No medications on file     Bethel Born, PA-C 08/26/16 1823    Shaune Pollack, MD 08/28/16 1146

## 2016-08-26 NOTE — ED Triage Notes (Signed)
Pt here for sob and productive cough x2 days, family member reports pt has not been eating as well as she normally does. Pt denies pain. resp e/u.

## 2016-08-26 NOTE — ED Notes (Signed)
Pacemaker interrogated. 

## 2016-08-26 NOTE — H&P (Signed)
ADMISSION HISTORY & PHYSICAL   Chief Complaint:  Fatigue, progressive DOE, cough  Cardiologist: Dr. Lovena Le  Primary Care Physician: Leonides Sake, MD  HPI:  This is a 83 y.o. female with a past medical history significant for symptomatic bradycardia, s/p Medtronic PPM in 2008, chronic CHF with LVEF 45-50% by echo in 2016, NSTEMI, HTN, dementia, hypothyroidism, dyslipidemia, prior TIA, multiple falls, etc. She lives at home and gets 24/7 care by her family. Apparently over the past several days she's been more short of breath with exertion and has had a nonproductive cough. She has also had a significant decrease in her appetite. Her dry weight is apparently around 130 lbs nd she weighed 138 today. Laboratory work indicates a markedly elevated BNP of 1890 (previously she had a BNP in 2016 which was 539). Initial troponin is 0.03. Otherwise lab work is fairly unremarkable. She did have a chest x-ray which shows cardiomegaly but no significant pulmonary edema. She denies any chest pain. Interrogation of her pacemaker was performed in the emergency department which indicated that her devices and power saving mode. Recently she had a PACEART interrogation indicating that she was approaching ERI.  PMHx:  Past Medical History:  Diagnosis Date  . Anxiety   . Chronic combined systolic and diastolic CHF (congestive heart failure) ([place])   . Complication of anesthesia    ANXIETY  . Confusion Adm - 04/27/2013  . Dementia    "early" (02/28/2015)  . Depression   . Dizziness and giddiness   . History of stroke    a. Per chart.  Marland Kitchen HTN (hypertension)   . Hypothyroidism   . Iron deficiency anemia, unspecified   . LV dysfunction    a. 2D Echo 03/01/15: poor image quality, septal HK, ? inferior wall as well, mild LVH, mild focal basal hypertrophy of septum, EF 45-50%, mildly dilated LA, PASP 2mHg.  . Multiple falls    "over the last 6 months she's had 4" (02/28/2015)  . [place] (non[place]ST elevated  [place]ocardial [place] 02[place]   a. Dx 02/2015 - treated medically.  . Osteoarthritis    "everywhere"  . Overactive bladder   . Pacemaker   . Pure hypercholesterolemia   . Symptomatic bradycardia    a. Near syncope s/p Medtronic pacemaker 2008.  .Marland KitchenTIA (transient ischemic attack) "many of them"    Past Surgical History:  Procedure Laterality Date  . ABDOMINAL HYSTERECTOMY    . CATARACT EXTRACTION, BILATERAL Bilateral 1960's  . INSERT / REPLACE / REMOVE PACEMAKER    . PACEMAKER INSERTION  01/05/07   Medtronic, dual chamber. Dr. GCristopher PeruMD.  . PARTIAL HIP ARTHROPLASTY Bilateral   . TOTAL KNEE ARTHROPLASTY Bilateral     FAMHx:  Family History  Problem Relation Age of Onset  . Tuberculosis Mother   . Coronary artery disease Other   . Stroke Other   . COPD Other     SOCHx:   reports that she has never smoked. She has never used smokeless tobacco. She reports that she does not drink alcohol or use drugs.  ALLERGIES:  Allergies  Allergen Reactions  . Tape Other (See Comments)    PATIENT'S SKIN IS VERY THIN AND WILL BRUISE AND TEAR EASILY!!    ROS: Pertinent items noted in HPI and remainder of comprehensive ROS otherwise negative.  HOME MEDS: No current facility-administered medications on file prior to encounter.    Current Outpatient Prescriptions on File Prior to Encounter  Medication Sig Dispense Refill  .  aspirin 81 MG chewable tablet Chew 1 tablet (81 mg total) by mouth daily.    . citalopram (CELEXA) 20 MG tablet Take 20 mg by mouth daily.     . clopidogrel (PLAVIX) 75 MG tablet Take 1 tablet (75 mg total) by mouth daily. 90 tablet 3  . furosemide (LASIX) 20 MG tablet Take 1 tablet (20 mg total) by mouth daily. 90 tablet 3  . lisinopril (PRINIVIL,ZESTRIL) 5 MG tablet Take 1 tablet (5 mg total) by mouth at bedtime. 90 tablet 3  . loratadine (CLARITIN) 10 MG tablet Take 10 mg by mouth daily as needed for allergies.     . metoprolol tartrate (LOPRESSOR)  25 MG tablet Take 0.5 tablets (12.5 mg total) by mouth 2 (two) times daily. (Patient taking differently: Take 6.25-12.5 mg by mouth See admin instructions. 6.25 mg in the morning and 12.5 mg at bedtime ONLY IF SYSTOLIC B/P IS 834 OR GREATER AND PULSE RATE IS 60 BPM OR GREATER) 30 tablet 11  . Multiple Vitamin (MULTIVITAMIN) tablet Take 1 tablet by mouth daily.      . nitroGLYCERIN (NITROSTAT) 0.4 MG SL tablet Place 1 tablet (0.4 mg total) under the tongue every 5 (five) minutes as needed for chest pain. 30 tablet 0  . pravastatin (PRAVACHOL) 40 MG tablet Take 40 mg by mouth at bedtime.      Marland Kitchen oxyCODONE-acetaminophen (PERCOCET/ROXICET) 5-325 MG per tablet Take 1 tablet by mouth every 8 (eight) hours as needed for severe pain. (Patient not taking: Reported on 08/26/2016) 30 tablet 0    LABS/IMAGING: Results for orders placed or performed during the hospital encounter of 08/26/16 (from the past 48 hour(s))  Comprehensive metabolic panel     Status: Abnormal   Collection Time: 08/26/16 12:00 PM  Result Value Ref Range   Sodium 137 135 - 145 mmol/L   Potassium 3.7 3.5 - 5.1 mmol/L   Chloride 98 (L) 101 - 111 mmol/L   CO2 29 22 - 32 mmol/L   Glucose, Bld 102 (H) 65 - 99 mg/dL   BUN 14 6 - 20 mg/dL   Creatinine, Ser 0.74 0.44 - 1.00 mg/dL   Calcium 8.7 (L) 8.9 - 10.3 mg/dL   Total Protein 6.2 (L) 6.5 - 8.1 g/dL   Albumin 3.3 (L) 3.5 - 5.0 g/dL   AST 27 15 - 41 U/L   ALT 17 14 - 54 U/L   Alkaline Phosphatase 72 38 - 126 U/L   Total Bilirubin 1.0 0.3 - 1.2 mg/dL   GFR calc non Af Amer >60 >60 mL/min   GFR calc Af Amer >60 >60 mL/min    Comment: (NOTE) The eGFR has been calculated using the CKD EPI equation. This calculation has not been validated in all clinical situations. eGFR's persistently <60 mL/min signify possible Chronic Kidney Disease.    Anion gap 10 5 - 15  CBC with Differential     Status: Abnormal   Collection Time: 08/26/16 12:00 PM  Result Value Ref Range   WBC 7.3 4.0 -  10.5 K/uL   RBC 3.87 3.87 - 5.11 MIL/uL   Hemoglobin 11.9 (L) 12.0 - 15.0 g/dL   HCT 37.0 36.0 - 46.0 %   MCV 95.6 78.0 - 100.0 fL   MCH 30.7 26.0 - 34.0 pg   MCHC 32.2 30.0 - 36.0 g/dL   RDW 13.4 11.5 - 15.5 %   Platelets 234 150 - 400 K/uL   Neutrophils Relative % 69 %   Neutro Abs 5.1  1.7 - 7.7 K/uL   Lymphocytes Relative 17 %   Lymphs Abs 1.2 0.7 - 4.0 K/uL   Monocytes Relative 12 %   Monocytes Absolute 0.9 0.1 - 1.0 K/uL   Eosinophils Relative 2 %   Eosinophils Absolute 0.1 0.0 - 0.7 K/uL   Basophils Relative 0 %   Basophils Absolute 0.0 0.0 - 0.1 K/uL  I-Stat CG4 Lactic Acid, ED     Status: None   Collection Time: 08/26/16 12:13 PM  Result Value Ref Range   Lactic Acid, Venous 1.54 0.5 - 1.9 mmol/L  Brain natriuretic peptide     Status: Abnormal   Collection Time: 08/26/16  4:30 PM  Result Value Ref Range   B Natriuretic Peptide 1,890.8 (H) 0.0 - 100.0 pg/mL  I-stat troponin, ED     Status: None   Collection Time: 08/26/16  4:39 PM  Result Value Ref Range   Troponin i, poc 0.03 0.00 - 0.08 ng/mL   Comment 3            Comment: Due to the release kinetics of cTnI, a negative result within the first hours of the onset of symptoms does not rule out myocardial infarction with certainty. If myocardial infarction is still suspected, repeat the test at appropriate intervals.    Dg Chest 2 View  Result Date: 08/26/2016 CLINICAL DATA:  Cough, shortness of breath for 3 days. EXAM: CHEST  2 VIEW COMPARISON:  02/28/2015 FINDINGS: Left pacer remains in place, unchanged. There is cardiomegaly. Left lower lobe atelectasis or scarring. No visible effusions. No acute bony abnormality. IMPRESSION: Cardiomegaly.  Left base scarring or atelectasis. Electronically Signed   By: Rolm Baptise M.D.   On: 08/26/2016 12:45    VITALS: Vitals:   08/26/16 1600 08/26/16 1615  BP: 167/77 166/85  Pulse: 64 66  Resp: 19 19  Temp:      EXAM: General appearance: alert and no distress Neck:  JVD - at earlobe cm above sternal notch and no carotid bruit Lungs: diminished breath sounds bibasilar Heart: regular rate and rhythm, S1, S2 normal and S3 present Abdomen: mildly protuberant, no rebound or guarding, +BS Extremities: extremities normal, atraumatic, no cyanosis or edema Pulses: 2+ and symmetric Skin: Pale, cool, dry Neurologic: Mental status: Alert and oriented Psych: Appears frustruated to be in the hospital  IMPRESSION: Principal Problem:   Acute combined systolic and diastolic congestive heart failure, NYHA class 4 (HCC) Active Problems:   HYPERCHOLESTEROLEMIA   Essential hypertension   Dementia   Sinus node dysfunction s/p Medtronic pacemaker 2008   Pacemaker battery depletion   PLAN: 1. Mrs. Boese presents with progressive dyspnea on exertion, weight gain, anorexia and nonproductive cough over the past 3-4 days. Her daughter who is a Marine scientist gave her extra dose of Lasix last night and felt that this may represent heart failure. Exam is consistent with this with elevated JVP and her BNP is markedly elevated. I recommend admission for diuresis and will repeat an echocardiogram to see if there's been any decline in LV function since her last echo in 2016. Additionally, her pacemaker was re-interrogated and is in power save mode said she is nearing ERI. This was identified during her most recent visit with Dr. Lovena Le. It would likely be convenient for her to have pacemaker generator change out during this hospitalization. I will contact EP tomorrow to inform them of this.  Pixie Casino, MD, [facility] Attending Cardiologist [place] C Elisha Mcgruder 08/26/2016, 6:15 PM

## 2016-08-26 NOTE — ED Notes (Signed)
Cardiology at bedside.

## 2016-08-27 ENCOUNTER — Other Ambulatory Visit (HOSPITAL_COMMUNITY): Payer: Medicare Other

## 2016-08-27 ENCOUNTER — Encounter (HOSPITAL_COMMUNITY): Payer: Self-pay | Admitting: Nurse Practitioner

## 2016-08-27 ENCOUNTER — Encounter (HOSPITAL_COMMUNITY)
Admission: EM | Disposition: A | Discharge status: Home Health | Payer: Self-pay | Source: Home / Self Care | Attending: Internal Medicine

## 2016-08-27 DIAGNOSIS — I495 Sick sinus syndrome: Secondary | ICD-10-CM

## 2016-08-27 DIAGNOSIS — F0390 Unspecified dementia without behavioral disturbance: Secondary | ICD-10-CM

## 2016-08-27 DIAGNOSIS — R410 Disorientation, unspecified: Secondary | ICD-10-CM

## 2016-08-27 DIAGNOSIS — Z45010 Encounter for checking and testing of cardiac pacemaker pulse generator [battery]: Secondary | ICD-10-CM

## 2016-08-27 HISTORY — PX: PPM GENERATOR CHANGEOUT: EP1233

## 2016-08-27 LAB — BASIC METABOLIC PANEL
Anion gap: 10 (ref 5–15)
BUN: 16 mg/dL (ref 6–20)
CHLORIDE: 98 mmol/L — AB (ref 101–111)
CO2: 29 mmol/L (ref 22–32)
CREATININE: 0.76 mg/dL (ref 0.44–1.00)
Calcium: 8.3 mg/dL — ABNORMAL LOW (ref 8.9–10.3)
GFR calc Af Amer: >60 mL/min (ref >60)
GFR calc non Af Amer: >60 mL/min (ref >60)
Glucose, Bld: 87 mg/dL (ref 65–99)
POTASSIUM: 3.1 mmol/L — AB (ref 3.5–5.1)
SODIUM: 137 mmol/L (ref 135–145)

## 2016-08-27 SURGERY — PPM GENERATOR CHANGEOUT
Anesthesia: LOCAL

## 2016-08-27 MED ORDER — CEFAZOLIN SODIUM-DEXTROSE 2-4 GM/100ML-% IV SOLN
INTRAVENOUS | Status: AC
  Filled 2016-08-27: qty 100

## 2016-08-27 MED ORDER — CEFAZOLIN SODIUM-DEXTROSE 2-4 GM/100ML-% IV SOLN
2.0000 g | INTRAVENOUS | Status: AC
Start: 2016-08-27 — End: 2016-08-27
  Administered 2016-08-27: 2 g via INTRAVENOUS

## 2016-08-27 MED ORDER — ONDANSETRON HCL 4 MG/2ML IJ SOLN: 4.0000 mg | Freq: Four times a day (QID) | INTRAMUSCULAR | Status: DC | PRN

## 2016-08-27 MED ORDER — SODIUM CHLORIDE 0.9 % IV SOLN: 250.0000 mL | INTRAVENOUS | Status: DC | PRN

## 2016-08-27 MED ORDER — SODIUM CHLORIDE 0.9 % IV SOLN
INTRAVENOUS | Status: DC
  Administered 2016-08-27: 14:00:00 via INTRAVENOUS

## 2016-08-27 MED ORDER — LIDOCAINE HCL (PF) 1 % IJ SOLN
INTRAMUSCULAR | Status: AC
  Filled 2016-08-27: qty 30

## 2016-08-27 MED ORDER — SODIUM CHLORIDE 0.9 % IR SOLN
Status: AC
  Filled 2016-08-27: qty 2

## 2016-08-27 MED ORDER — ACETAMINOPHEN 325 MG PO TABS: 325.0000 mg | ORAL_TABLET | ORAL | Status: DC | PRN

## 2016-08-27 MED ORDER — SODIUM CHLORIDE 0.9 % IR SOLN
80.0000 mg | Status: AC
  Administered 2016-08-27: 80 mg

## 2016-08-27 MED ORDER — CHLORHEXIDINE GLUCONATE 4 % EX LIQD: 60.0000 mL | Freq: Once | CUTANEOUS | Status: DC

## 2016-08-27 MED ORDER — CHLORHEXIDINE GLUCONATE 4 % EX LIQD
60.0000 mL | Freq: Once | CUTANEOUS | Status: AC
  Administered 2016-08-27: 4 via TOPICAL
  Filled 2016-08-27: qty 60

## 2016-08-27 MED ORDER — LIDOCAINE HCL (PF) 1 % IJ SOLN
INTRAMUSCULAR | Status: DC | PRN
  Administered 2016-08-27: 31 mL

## 2016-08-27 MED ORDER — POTASSIUM CHLORIDE CRYS ER 20 MEQ PO TBCR
40.0000 meq | EXTENDED_RELEASE_TABLET | Freq: Once | ORAL | Status: AC
  Administered 2016-08-27: 40 meq via ORAL
  Filled 2016-08-27: qty 2

## 2016-08-27 MED ORDER — SODIUM CHLORIDE 0.9% FLUSH
3.0000 mL | Freq: Two times a day (BID) | INTRAVENOUS | Status: DC
  Administered 2016-08-27 – 2016-08-28 (×2): 3 mL via INTRAVENOUS

## 2016-08-27 MED ORDER — SODIUM CHLORIDE 0.9% FLUSH: 3.0000 mL | INTRAVENOUS | Status: DC | PRN

## 2016-08-27 MED ORDER — CEFAZOLIN IN D5W 1 GM/50ML IV SOLN
1.0000 g | Freq: Four times a day (QID) | INTRAVENOUS | Status: AC
  Administered 2016-08-27 – 2016-08-28 (×3): 1 g via INTRAVENOUS
  Filled 2016-08-27 (×3): qty 50

## 2016-08-27 SURGICAL SUPPLY — 5 items
CABLE SURGICAL S-101-97-12 (CABLE) ×1 IMPLANT
PACEMAKER ADAPTA DR ADDRL1 (Pacemaker) IMPLANT
PAD DEFIB LIFELINK (PAD) ×1 IMPLANT
PPM ADAPTA DR ADDRL1 (Pacemaker) ×2 IMPLANT
TRAY PACEMAKER INSERTION (PACKS) ×1 IMPLANT

## 2016-08-27 NOTE — Progress Notes (Signed)
Text paged cardiology with lab results, awaiting orders

## 2016-08-27 NOTE — Interval H&P Note (Signed)
History and Physical Interval Note:  08/27/2016 2:26 PM  [name]  has presented today for surgery, with the diagnosis of Sinus Node Dysfunction  The various methods of treatment have been discussed with the patient and family. After consideration of risks, benefits and other options for treatment, the patient has consented to  Procedure(s): PPM Generator Changeout (N/A) as a surgical intervention .  The patient's history has been reviewed, patient examined, no change in status, stable for surgery.  I have reviewed the patient's chart and labs.  Questions were answered to the patient's satisfaction.     [name]

## 2016-08-27 NOTE — Progress Notes (Signed)
Dana BuffSeiler, NP with electrophysioplogy group has agreed for EP to evaluate patient for pacemaker generator change. [name] with [employer] has been asked to interrogate pacer again today. Still trying to contact pts daughter,  [name], who is patients POA and lives in [place].  @ 11:53- spoke with [name] the POA. She is agreeable to echo and pacemaker generator change for [name]. She believes it is the patients wish to be DNI, does not want to be on a ventilator. Willing to accept compressions,shocks and other life saving efforts if her heart were to stop but wants to talk to her brothers first and see how she does today. Will leave code status to FULL for now until family comes to a consensus.   POA [name]:  [phone]

## 2016-08-27 NOTE — H&P (View-Only) (Signed)
ELECTROPHYSIOLOGY CONSULT NOTE    Patient ID: [name] MRN: [id number], DOB/AGE: 81/04/1919 81 y.o.  Admit date: 08/26/2016 Date of Consult: 08/27/2016  Primary Physician: Ailene Ravel, MD Electrophysiologist: Isaias Cowman MD: [facility]  Reason for Consultation: pacemaker at ERI   HPI:  [name] is a 83 y.o. female with a past medical history significant for sinus node dysfunction s/p PPM, prior TIA, chronic diastolic heart failure, and hypertension. She remains independent living at home.  She was last seen by Dr [name] in December at which time she was doing well.  She developed worsening shortness of breath over the last few days for which she presented to the ER.  Device interrogation demonstrated device at ERI with reversion to VVI 65.  She has been diuresed with improvement in shortness of breath.  She is currently pleasant and oriented x3. She denies chest pain, shortness of breath, LE edema, recent fevers, chills, nausea or vomiting.  Last echo 2015 with EF 45-50%. EP has been asked to evaluate for pacemaker generator change.   Past Medical History:  Diagnosis Date  . Anxiety   . Chronic combined systolic and diastolic CHF (congestive heart failure) (HCC)   . Complication of anesthesia    ANXIETY  . Confusion Adm - 04/27/2013  . Dementia    "early" (02/28/2015)  . Depression   . Dizziness and giddiness   . History of stroke    a. Per chart.  Marland Kitchen HTN (hypertension)   . Hypothyroidism   . Iron deficiency anemia, unspecified   . LV dysfunction    a. 2D Echo 03/01/15: poor image quality, septal HK, ? inferior wall as well, mild LVH, mild focal basal hypertrophy of septum, EF 45-50%, mildly dilated LA, PASP .  . Multiple falls    "over the last 6 months she's had 4" (02/28/2015)  . NSTEMI (non-ST elevated myocardial infarction) (HCC) 02/28/2015   a. Dx 02/2015 - treated medically.  . Osteoarthritis    "everywhere"  . Overactive bladder   . Pacemaker     . Pure hypercholesterolemia   . Symptomatic bradycardia    a. Near syncope s/p Medtronic pacemaker 2008.  Marland Kitchen TIA (transient ischemic attack) "many of them"     Surgical History:  Past Surgical History:  Procedure Laterality Date  . ABDOMINAL HYSTERECTOMY    . CATARACT EXTRACTION, BILATERAL Bilateral 1960's  . INSERT / REPLACE / REMOVE PACEMAKER    . PACEMAKER INSERTION  01/05/07   Medtronic, dual chamber. Dr. Lewayne Bunting MD.  . PARTIAL HIP ARTHROPLASTY Bilateral   . TOTAL KNEE ARTHROPLASTY Bilateral      Prescriptions Prior to Admission  Medication Sig Dispense Refill Last Dose  . aspirin 81 MG chewable tablet Chew 1 tablet (81 mg total) by mouth daily.   08/26/2016 at 1000  . citalopram (CELEXA) 20 MG tablet Take 20 mg by mouth daily.    08/26/2016 at am  . clopidogrel (PLAVIX) 75 MG tablet Take 1 tablet (75 mg total) by mouth daily. 90 tablet 3 08/26/2016 at 1000  . furosemide (LASIX) 20 MG tablet Take 1 tablet (20 mg total) by mouth daily. 90 tablet 3 08/26/2016 at am  . levothyroxine (SYNTHROID, LEVOTHROID) 112 MCG tablet Take 112 mcg by mouth daily before breakfast.   08/26/2016 at am  . loratadine (CLARITIN) 10 MG tablet Take 10 mg by mouth daily as needed for allergies.    PRN at PRN  . losartan (COZAAR) 25 MG tablet Take 25 mg  by mouth at bedtime.    08/25/2016 at pm  . metoprolol tartrate (LOPRESSOR) 25 MG tablet Take 0.5 tablets (12.5 mg total) by mouth 2 (two) times daily. (Patient taking differently: Take 6.25-12.5 mg by mouth See admin instructions. 6.25 mg in the morning and 12.5 mg at bedtime ONLY IF SYSTOLIC B/P IS 100 OR GREATER AND PULSE RATE IS 60 BPM OR GREATER) 30 tablet 11 08/26/2016 at 0800  . Multiple Vitamin (MULTIVITAMIN) tablet Take 1 tablet by mouth daily.     08/26/2016 at am  . multivitamin-lutein (OCUVITE-LUTEIN) CAPS capsule Take 1 capsule by mouth daily.   08/26/2016 at am  . nitroGLYCERIN (NITROSTAT) 0.4 MG SL tablet Place 1 tablet (0.4 mg total) under the  tongue every 5 (five) minutes as needed for chest pain. 30 tablet 0 PRN at PRN  . oxyCODONE-acetaminophen (PERCOCET) 7.5-325 MG tablet Take 0.5 tablets by mouth at bedtime as needed for pain.   08/25/2016 at pm  . pravastatin (PRAVACHOL) 40 MG tablet Take 40 mg by mouth at bedtime.     08/25/2016 at pm    Inpatient Medications:  . aspirin  81 mg Oral Daily  . citalopram  20 mg Oral Daily  . clopidogrel  75 mg Oral Daily  . furosemide  40 mg Intravenous BID  . levothyroxine  112 mcg Oral QAC breakfast  . losartan  25 mg Oral QHS  . metoprolol tartrate  12.5 mg Oral BID  . multivitamin with minerals  1 tablet Oral Daily  . pravastatin  40 mg Oral QHS  . sodium chloride flush  3 mL Intravenous Q12H    Allergies:  Allergies  Allergen Reactions  . Tape Other (See Comments)    PATIENT'S SKIN IS VERY THIN AND WILL BRUISE AND TEAR EASILY!!    Social History   Social History  . Marital status: Widowed    Spouse name: N/A  . Number of children: N/A  . Years of education: N/A   Occupational History  . Not on file.   Social History Main Topics  . Smoking status: Never Smoker  . Smokeless tobacco: Never Used  . Alcohol use No  . Drug use: No  . Sexual activity: Not on file   Other Topics Concern  . Not on file   Social History Narrative   Lives with her husband.      Family History  Problem Relation Age of Onset  . Tuberculosis Mother   . Coronary artery disease Other   . Stroke Other   . COPD Other      Review of Systems: All other systems reviewed and are otherwise negative except as noted above.  Physical Exam: Vitals:   08/26/16 2000 08/27/16 0004 08/27/16 0500 08/27/16 0938  BP: (!) 151/74 (!) 141/64 (!) 176/82 (!) 163/77  Pulse: 62 64 65 65  Resp: 18 18 18    Temp: 98.4 F (36.9 C) 98.1 F (36.7 C) 98.7 F (37.1 C)   TempSrc: Oral Oral Oral   SpO2: 94% 94% 93% 100%  Weight: 138 lb 3.7 oz (62.7 kg)  137 lb 9 oz (62.4 kg)   Height: 5\' 2"  (1.575 m)        GEN- The patient is elderly appearing, alert and oriented x 3 today.   HEENT: normocephalic, atraumatic; sclera clear, conjunctiva pink; hearing intact; oropharynx clear; neck supple  Lungs- Clear to ausculation bilaterally, normal work of breathing.  No wheezes, rales, rhonchi Heart- Regular rate and rhythm(paced) GI- soft, non-tender, non-distended,  bowel sounds present  Extremities- no clubbing, cyanosis, or edema  MS- no significant deformity or atrophy Skin- warm and dry, no rash or lesion Psych- euthymic mood, full affect Neuro- strength and sensation are intact  Labs:   Lab Results  Component Value Date   WBC 7.3 08/26/2016   HGB 11.9 (L) 08/26/2016   HCT 37.0 08/26/2016   MCV 95.6 08/26/2016   PLT 234 08/26/2016     Recent Labs Lab 08/26/16 1200 08/27/16 0512  NA 137 137  K 3.7 3.1*  CL 98* 98*  CO2 29 29  BUN 14 16  CREATININE 0.74 0.76  CALCIUM 8.7* 8.3*  PROT 6.2*  --   BILITOT 1.0  --   ALKPHOS 72  --   ALT 17  --   AST 27  --   GLUCOSE 102* 87      Radiology/Studies: Dg Chest 2 View Result Date: 08/26/2016 CLINICAL DATA:  Cough, shortness of breath for 3 days. EXAM: CHEST  2 VIEW COMPARISON:  02/28/2015 FINDINGS: Left pacer remains in place, unchanged. There is cardiomegaly. Left lower lobe atelectasis or scarring. No visible effusions. No acute bony abnormality. IMPRESSION: Cardiomegaly.  Left base scarring or atelectasis. Electronically Signed   By: Charlett Nose M.D.   On: 08/26/2016 12:45    EKG: sinus rhythm with AV dissociation and V pacing at 65  TELEMETRY: V pacing at 65  DEVICE HISTORY: MDT dual chamber pacemaker implanted 2008 for sinus node dysfunction   Assessment/Plan: 1.  Sinus node dysfunction Pacemaker has reached ERI with reversion to VVI 65 and symptomatic pacemaker syndrome.  Unfortunately, I was unable to reprogram device today to allow for hysteresis and AV synchrony. Because of symptoms, I think pacemaker generator change  this admission is most appropriate. Risks, benefits reviewed with the patient and her daughter in law who wishes to proceed.  Will plan for later today.  She is alert and oriented x3 today while talking with me and I feel that she is able to sign her own consent.  Her daughter in law has discussed with her family and they are all in agreement for gen change.  2. HTN Stable No change required today  3.  Acute on chronic combined systolic and diastolic heart failure Worsened by VVI 65 Should improve with AV synchrony   Dr Johney Frame to see later today  Signed, Gypsy Balsam, NP 08/27/2016 11:32 AM  I have seen, examined the patient, and reviewed the above assessment and plan.  On exam, RRR.  Changes to above are made where necessary.  Risks, benefits, and alternatives to pacemaker pulse generator replacement were discussed in detail today with patient and family.  The patient understands that risks include but are not limited to bleeding, infection, pneumothorax, perforation, tamponade, vascular damage, renal failure, MI, stroke, death, damage to his existing leads, and lead dislodgement and wishes to proceed.     Co Sign: Hillis Range, MD 08/27/2016 2:23 PM

## 2016-08-27 NOTE — Progress Notes (Signed)
Patient c/o of shortness of breath when RN in room.  Says she feels "short this morning".  O2 sat 92 on room air.  Patient in no respiratory distress.  Son in law says she has been c/o of this x 3 days, Lasix 40 mg given.  Spoke with PA regarding this and K of 3.1.  Orders received.

## 2016-08-27 NOTE — Consult Note (Signed)
ELECTROPHYSIOLOGY CONSULT NOTE    Patient ID: [name] MRN: [id number], DOB/AGE: 81/04/1919 81 y.o.  Admit date: 08/26/2016 Date of Consult: 08/27/2016  Primary Physician: Ailene Ravel, MD Electrophysiologist: Isaias Cowman MD: [facility]  Reason for Consultation: pacemaker at ERI   HPI:  [name] is a 83 y.o. female with a past medical history significant for sinus node dysfunction s/p PPM, prior TIA, chronic diastolic heart failure, and hypertension. She remains independent living at home.  She was last seen by Dr [name] in December at which time she was doing well.  She developed worsening shortness of breath over the last few days for which she presented to the ER.  Device interrogation demonstrated device at ERI with reversion to VVI 65.  She has been diuresed with improvement in shortness of breath.  She is currently pleasant and oriented x3. She denies chest pain, shortness of breath, LE edema, recent fevers, chills, nausea or vomiting.  Last echo 2015 with EF 45-50%. EP has been asked to evaluate for pacemaker generator change.   Past Medical History:  Diagnosis Date  . Anxiety   . Chronic combined systolic and diastolic CHF (congestive heart failure) (HCC)   . Complication of anesthesia    ANXIETY  . Confusion Adm - 04/27/2013  . Dementia    "early" (02/28/2015)  . Depression   . Dizziness and giddiness   . History of stroke    a. Per chart.  Marland Kitchen HTN (hypertension)   . Hypothyroidism   . Iron deficiency anemia, unspecified   . LV dysfunction    a. 2D Echo 03/01/15: poor image quality, septal HK, ? inferior wall as well, mild LVH, mild focal basal hypertrophy of septum, EF 45-50%, mildly dilated LA, PASP .  . Multiple falls    "over the last 6 months she's had 4" (02/28/2015)  . NSTEMI (non-ST elevated myocardial infarction) (HCC) 02/28/2015   a. Dx 02/2015 - treated medically.  . Osteoarthritis    "everywhere"  . Overactive bladder   . Pacemaker     . Pure hypercholesterolemia   . Symptomatic bradycardia    a. Near syncope s/p Medtronic pacemaker 2008.  Marland Kitchen TIA (transient ischemic attack) "many of them"     Surgical History:  Past Surgical History:  Procedure Laterality Date  . ABDOMINAL HYSTERECTOMY    . CATARACT EXTRACTION, BILATERAL Bilateral 1960's  . INSERT / REPLACE / REMOVE PACEMAKER    . PACEMAKER INSERTION  01/05/07   Medtronic, dual chamber. Dr. Lewayne Bunting MD.  . PARTIAL HIP ARTHROPLASTY Bilateral   . TOTAL KNEE ARTHROPLASTY Bilateral      Prescriptions Prior to Admission  Medication Sig Dispense Refill Last Dose  . aspirin 81 MG chewable tablet Chew 1 tablet (81 mg total) by mouth daily.   08/26/2016 at 1000  . citalopram (CELEXA) 20 MG tablet Take 20 mg by mouth daily.    08/26/2016 at am  . clopidogrel (PLAVIX) 75 MG tablet Take 1 tablet (75 mg total) by mouth daily. 90 tablet 3 08/26/2016 at 1000  . furosemide (LASIX) 20 MG tablet Take 1 tablet (20 mg total) by mouth daily. 90 tablet 3 08/26/2016 at am  . levothyroxine (SYNTHROID, LEVOTHROID) 112 MCG tablet Take 112 mcg by mouth daily before breakfast.   08/26/2016 at am  . loratadine (CLARITIN) 10 MG tablet Take 10 mg by mouth daily as needed for allergies.    PRN at PRN  . losartan (COZAAR) 25 MG tablet Take 25 mg  by mouth at bedtime.    08/25/2016 at pm  . metoprolol tartrate (LOPRESSOR) 25 MG tablet Take 0.5 tablets (12.5 mg total) by mouth 2 (two) times daily. (Patient taking differently: Take 6.25-12.5 mg by mouth See admin instructions. 6.25 mg in the morning and 12.5 mg at bedtime ONLY IF SYSTOLIC B/P IS 100 OR GREATER AND PULSE RATE IS 60 BPM OR GREATER) 30 tablet 11 08/26/2016 at 0800  . Multiple Vitamin (MULTIVITAMIN) tablet Take 1 tablet by mouth daily.     08/26/2016 at am  . multivitamin-lutein (OCUVITE-LUTEIN) CAPS capsule Take 1 capsule by mouth daily.   08/26/2016 at am  . nitroGLYCERIN (NITROSTAT) 0.4 MG SL tablet Place 1 tablet (0.4 mg total) under the  tongue every 5 (five) minutes as needed for chest pain. 30 tablet 0 PRN at PRN  . oxyCODONE-acetaminophen (PERCOCET) 7.5-325 MG tablet Take 0.5 tablets by mouth at bedtime as needed for pain.   08/25/2016 at pm  . pravastatin (PRAVACHOL) 40 MG tablet Take 40 mg by mouth at bedtime.     08/25/2016 at pm    Inpatient Medications:  . aspirin  81 mg Oral Daily  . citalopram  20 mg Oral Daily  . clopidogrel  75 mg Oral Daily  . furosemide  40 mg Intravenous BID  . levothyroxine  112 mcg Oral QAC breakfast  . losartan  25 mg Oral QHS  . metoprolol tartrate  12.5 mg Oral BID  . multivitamin with minerals  1 tablet Oral Daily  . pravastatin  40 mg Oral QHS  . sodium chloride flush  3 mL Intravenous Q12H    Allergies:  Allergies  Allergen Reactions  . Tape Other (See Comments)    PATIENT'S SKIN IS VERY THIN AND WILL BRUISE AND TEAR EASILY!!    Social History   Social History  . Marital status: Widowed    Spouse name: N/A  . Number of children: N/A  . Years of education: N/A   Occupational History  . Not on file.   Social History Main Topics  . Smoking status: Never Smoker  . Smokeless tobacco: Never Used  . Alcohol use No  . Drug use: No  . Sexual activity: Not on file   Other Topics Concern  . Not on file   Social History Narrative   Lives with her husband.      Family History  Problem Relation Age of Onset  . Tuberculosis Mother   . Coronary artery disease Other   . Stroke Other   . COPD Other      Review of Systems: All other systems reviewed and are otherwise negative except as noted above.  Physical Exam: Vitals:   08/26/16 2000 08/27/16 0004 08/27/16 0500 08/27/16 0938  BP: (!) 151/74 (!) 141/64 (!) 176/82 (!) 163/77  Pulse: 62 64 65 65  Resp: 18 18 18    Temp: 98.4 F (36.9 C) 98.1 F (36.7 C) 98.7 F (37.1 C)   TempSrc: Oral Oral Oral   SpO2: 94% 94% 93% 100%  Weight: 138 lb 3.7 oz (62.7 kg)  137 lb 9 oz (62.4 kg)   Height: 5\' 2"  (1.575 m)        GEN- The patient is elderly appearing, alert and oriented x 3 today.   HEENT: normocephalic, atraumatic; sclera clear, conjunctiva pink; hearing intact; oropharynx clear; neck supple  Lungs- Clear to ausculation bilaterally, normal work of breathing.  No wheezes, rales, rhonchi Heart- Regular rate and rhythm(paced) GI- soft, non-tender, non-distended,  bowel sounds present  Extremities- no clubbing, cyanosis, or edema  MS- no significant deformity or atrophy Skin- warm and dry, no rash or lesion Psych- euthymic mood, full affect Neuro- strength and sensation are intact  Labs:   Lab Results  Component Value Date   WBC 7.3 08/26/2016   HGB 11.9 (L) 08/26/2016   HCT 37.0 08/26/2016   MCV 95.6 08/26/2016   PLT 234 08/26/2016     Recent Labs Lab 08/26/16 1200 08/27/16 0512  NA 137 137  K 3.7 3.1*  CL 98* 98*  CO2 29 29  BUN 14 16  CREATININE 0.74 0.76  CALCIUM 8.7* 8.3*  PROT 6.2*  --   BILITOT 1.0  --   ALKPHOS 72  --   ALT 17  --   AST 27  --   GLUCOSE 102* 87      Radiology/Studies: Dg Chest 2 View Result Date: 08/26/2016 CLINICAL DATA:  Cough, shortness of breath for 3 days. EXAM: CHEST  2 VIEW COMPARISON:  02/28/2015 FINDINGS: Left pacer remains in place, unchanged. There is cardiomegaly. Left lower lobe atelectasis or scarring. No visible effusions. No acute bony abnormality. IMPRESSION: Cardiomegaly.  Left base scarring or atelectasis. Electronically Signed   By: Charlett Nose M.D.   On: 08/26/2016 12:45    EKG: sinus rhythm with AV dissociation and V pacing at 65  TELEMETRY: V pacing at 65  DEVICE HISTORY: MDT dual chamber pacemaker implanted 2008 for sinus node dysfunction   Assessment/Plan: 1.  Sinus node dysfunction Pacemaker has reached ERI with reversion to VVI 65 and symptomatic pacemaker syndrome.  Unfortunately, I was unable to reprogram device today to allow for hysteresis and AV synchrony. Because of symptoms, I think pacemaker generator change  this admission is most appropriate. Risks, benefits reviewed with the patient and her daughter in law who wishes to proceed.  Will plan for later today.  She is alert and oriented x3 today while talking with me and I feel that she is able to sign her own consent.  Her daughter in law has discussed with her family and they are all in agreement for gen change.  2. HTN Stable No change required today  3.  Acute on chronic combined systolic and diastolic heart failure Worsened by VVI 65 Should improve with AV synchrony   Dr Johney Frame to see later today  Signed, Gypsy Balsam, NP 08/27/2016 11:32 AM  I have seen, examined the patient, and reviewed the above assessment and plan.  On exam, RRR.  Changes to above are made where necessary.  Risks, benefits, and alternatives to pacemaker pulse generator replacement were discussed in detail today with patient and family.  The patient understands that risks include but are not limited to bleeding, infection, pneumothorax, perforation, tamponade, vascular damage, renal failure, MI, stroke, death, damage to his existing leads, and lead dislodgement and wishes to proceed.     Co Sign: Hillis Range, MD 08/27/2016 2:23 PM

## 2016-08-27 NOTE — Plan of Care (Signed)
Problem: Activity: Goal: Risk for activity intolerance will decrease Outcome: Progressing Patient short of breath with exertion, awaiting pacemaker generator change   Problem: Activity: Goal: Capacity to carry out activities will improve Outcome: Progressing Receiving Iv lasix and awaiting pacemaker generator change

## 2016-08-27 NOTE — Progress Notes (Signed)
Progress Note  Patient Name: Dana PilonKathleen W Keller Date of Encounter: 08/27/2016  Primary Cardiologist: Dr. Lewayne BuntingGregg Taylor  Subjective   Patient is in room with son and daughter in law. She continues to feel SOB x 3 days. Family members say that she didn't come to the hospital until she couldn't stand it anymore. She has received her morning dose of IV lasix and has been urinating frequently. She is currently pending possible pacemaker generator change and an 2D echo to evaluate if she has been decline in LV function.  This morning she tells me that she feels as though her life is slipping away and that "its time". RN, Herbert SetaHeather present for conversation. She wants comfort for her breathing. Discussed DNR/DNI status, currently FULL code. She wants her daughter that is power of attorney and her daughter in law, neither who are currently present for this conversation.  She is agreeable to continuing plan of echo and having pacemaker generator change but would like to be kept as comfortable as possible.   No CP, doesn't appear to be in significant distress, normal vital signs.  Inpatient Medications    Scheduled Meds: . aspirin  81 mg Oral Daily  . citalopram  20 mg Oral Daily  . clopidogrel  75 mg Oral Daily  . furosemide  40 mg Intravenous BID  . heparin  5,000 Units Subcutaneous Q8H  . levothyroxine  112 mcg Oral QAC breakfast  . losartan  25 mg Oral QHS  . metoprolol tartrate  12.5 mg Oral BID  . multivitamin with minerals  1 tablet Oral Daily  . potassium chloride  40 mEq Oral Once  . pravastatin  40 mg Oral QHS  . sodium chloride flush  3 mL Intravenous Q12H   Continuous Infusions:  PRN Meds: sodium chloride, acetaminophen, ALPRAZolam, loratadine, ondansetron (ZOFRAN) IV, oxyCODONE-acetaminophen, sodium chloride flush   Vital Signs    Vitals:   08/26/16 1830 08/26/16 2000 08/27/16 0004 08/27/16 0500  BP: 167/94 (!) 151/74 (!) 141/64 (!) 176/82  Pulse: (!) 36 62 64 65  Resp:  22 18 18 18   Temp:  98.4 F (36.9 C) 98.1 F (36.7 C) 98.7 F (37.1 C)  TempSrc:  Oral Oral Oral  SpO2: 90% 94% 94% 93%  Weight:  138 lb 3.7 oz (62.7 kg)  137 lb 9 oz (62.4 kg)  Height:  5\' 2"  (1.575 m)      Intake/Output Summary (Last 24 hours) at 08/27/16 0857 Last data filed at 08/27/16 0552  Gross per 24 hour  Intake                0 ml  Output              725 ml  Net             -725 ml   Filed Weights   08/26/16 1644 08/26/16 2000 08/27/16 0500  Weight: 138 lb 8 oz (62.8 kg) 138 lb 3.7 oz (62.7 kg) 137 lb 9 oz (62.4 kg)    Telemetry    Ventricular paced rhythm, HR 60-65 - Personally Reviewed  ECG    None today - Personally Reviewed  Physical Exam   GEN: No acute distress.   Neck: + JVD Cardiac: RRR, no murmurs, rubs, or gallops.  Respiratory: Clear to auscultation bilaterally, minimal increased effort of breathing. No wheezing. Mild dry cough. GI: Soft, nontender, non-distended  MS: mild lower extremity edema; No deformity. Neuro:  Nonfocal  Psych: Normal affect  Labs    Chemistry Recent Labs Lab 08/26/16 1200 08/27/16 0512  NA 137 137  K 3.7 3.1*  CL 98* 98*  CO2 29 29  GLUCOSE 102* 87  BUN 14 16  CREATININE 0.74 0.76  CALCIUM 8.7* 8.3*  PROT 6.2*  --   ALBUMIN 3.3*  --   AST 27  --   ALT 17  --   ALKPHOS 72  --   BILITOT 1.0  --   GFRNONAA >60 >60  GFRAA >60 >60  ANIONGAP 10 10     Hematology Recent Labs Lab 08/26/16 1200  WBC 7.3  RBC 3.87  HGB 11.9*  HCT 37.0  MCV 95.6  MCH 30.7  MCHC 32.2  RDW 13.4  PLT 234    Cardiac Enzymes Recent Labs Lab 08/26/16 1639  TROPIPOC 0.03     BNP Recent Labs Lab 08/26/16 1630  BNP 1,890.8*       Radiology    Dg Chest 2 View Result Date: 08/26/2016  IMPRESSION: Cardiomegaly.  Left base scarring or atelectasis. Electronically Signed   By: Charlett Nose M.D.   On: 08/26/2016 12:45    Cardiac Studies   Transthoracic Echocardiography 03/01/2015  Study Conclusions  -  Left ventricle: Poor image quality septal hypokineis ? inferior   wall as well. The cavity size was mildly dilated. Wall thickness   was increased in a pattern of mild LVH. There was mild focal   basal hypertrophy of the septum. Systolic function was mildly   reduced. The estimated ejection fraction was in the range of 45%   to 50%. - Left atrium: The atrium was mildly dilated. - Pulmonary arteries: PA peak pressure: 32 mm Hg (S).   Patient Profile     83 y.o. female  is a 83 y.o. female with a past medical history significant for symptomatic bradycardia, s/p Medtronic PPM in 2008, chronic CHF with LVEF 45-50% by echo in 2016, NSTEMI, HTN, dementia, hypothyroidism, dyslipidemia, prior TIA, multiple falls, etc. She lives at home and gets 24/7 care by her family. Apparently over the past several days she's been more short of breath with exertion and has had a nonproductive cough. She has also had a significant decrease in her appetite. Her dry weight is apparently around 130 lbs nd she weighed 138 at admission. Laboratory work indicated a markedly elevated BNP of 1890 (previously she had a BNP in 2016 which was 539). Initial troponin was 0.03. Otherwise lab work was fairly unremarkable. She did have a chest x-ray which showed cardiomegaly but no significant pulmonary edema. Interrogation of her pacemaker was performed in the emergency department which indicated that her devices and power saving mode. Recently she had a PACEART interrogation indicating that she was approaching ERI.   Assessment & Plan    Acute systolic and diastolic CHF: Echo pending today, diuresing well with IV lasix.  Down at least 900 mL's. Put on 2L Emanuel for comfort, no respiratory distress and no CP. BUN/creat 16 / 0.76  Sinus node dysfunction s/p medtronic pacemaker: Dr. [name] to discuss with Dr. [name] about changing pacemaker generator during this hospitalization.  Hypertension: 24 hour; Max 176/94 and Min 141/64.   Currently poor control of hypertension. Recommend continue to diurese, could benefit from greater dose of BB (currently 6.25mg  in the AM and 12.5mg  in the PM) but i'm not sure her pulse rate of 60-65 can tolerate it.  -Would continue current therapy for now and after echo, if she volume overloaded, be  more aggressive with diuretics for treatment of her SOB and BP.  Anorexia: Currently failing to thrive.  Improvement of attitude with comfort Lyman 02. Hopefully diuresis she will feel better and start eating again. Discussed end of life goals, she would like me to discuss with her daughter in law and daughter that has POA and let them make decisions. I will discuss code status when daughter in law returns to hospital  Signed, Dorthula Matas, PA-C  08/27/2016, 8:57 AM

## 2016-08-27 NOTE — Progress Notes (Signed)
Patient back to unit after pacemaker generator change.  Left chest site of pacemaker with steri-strips covered by sure seal dressing.  Dressing dry and intact.  Vital signs stable.  Patient resting in bed with daughter and daughter in law at bedside.

## 2016-08-28 ENCOUNTER — Other Ambulatory Visit: Payer: Self-pay | Admitting: Emergency Medicine

## 2016-08-28 ENCOUNTER — Telehealth: Payer: Self-pay | Admitting: Internal Medicine

## 2016-08-28 ENCOUNTER — Encounter (HOSPITAL_COMMUNITY): Payer: Self-pay | Admitting: Internal Medicine

## 2016-08-28 ENCOUNTER — Inpatient Hospital Stay (HOSPITAL_COMMUNITY): Payer: Medicare Other

## 2016-08-28 DIAGNOSIS — E876 Hypokalemia: Secondary | ICD-10-CM

## 2016-08-28 DIAGNOSIS — I509 Heart failure, unspecified: Secondary | ICD-10-CM

## 2016-08-28 DIAGNOSIS — I5043 Acute on chronic combined systolic (congestive) and diastolic (congestive) heart failure: Secondary | ICD-10-CM

## 2016-08-28 DIAGNOSIS — I5031 Acute diastolic (congestive) heart failure: Secondary | ICD-10-CM

## 2016-08-28 LAB — BASIC METABOLIC PANEL
Anion gap: 9 (ref 5–15)
BUN: 18 mg/dL (ref 6–20)
CHLORIDE: 95 mmol/L — AB (ref 101–111)
CO2: 32 mmol/L (ref 22–32)
CREATININE: 0.82 mg/dL (ref 0.44–1.00)
Calcium: 8.3 mg/dL — ABNORMAL LOW (ref 8.9–10.3)
GFR calc Af Amer: >60 mL/min (ref >60)
GFR calc non Af Amer: 59 mL/min — ABNORMAL LOW (ref >60)
Glucose, Bld: 88 mg/dL (ref 65–99)
Potassium: 3.3 mmol/L — ABNORMAL LOW (ref 3.5–5.1)
SODIUM: 136 mmol/L (ref 135–145)

## 2016-08-28 LAB — ECHOCARDIOGRAM COMPLETE
Height: 62 in
Weight: 2134.4 oz

## 2016-08-28 MED ORDER — FUROSEMIDE 40 MG PO TABS: 40.0000 mg | ORAL_TABLET | Freq: Every day | ORAL | 2 refills | Status: DC

## 2016-08-28 MED ORDER — POTASSIUM CHLORIDE CRYS ER 20 MEQ PO TBCR
40.0000 meq | EXTENDED_RELEASE_TABLET | ORAL | Status: AC
  Administered 2016-08-28: 40 meq via ORAL
  Filled 2016-08-28: qty 2

## 2016-08-28 MED ORDER — POTASSIUM CHLORIDE ER 10 MEQ PO TBCR: 10.0000 meq | EXTENDED_RELEASE_TABLET | Freq: Every day | ORAL | 0 refills | Status: DC

## 2016-08-28 MED ORDER — FUROSEMIDE 40 MG PO TABS: 40.0000 mg | ORAL_TABLET | Freq: Every day | ORAL | Status: DC

## 2016-08-28 NOTE — Progress Notes (Signed)
Notified T> Green PA that pt's fmily member are waiting on d/c orders.  Instructed that they are working on it.  Notified family members.  Amanda PeaNellie Delisa Finck, RN

## 2016-08-28 NOTE — Care Management Note (Signed)
Case Management Note  Patient Details  Name: [name] MRN: [id number] Date of Birth: 12/20/1918  Subjective/Objective:     Admitted with CHF              Action/Plan: Patient lives at home her two sons takes turns staying at her home; PCP is Ailene RavelHAMRICK,MAURA L, MD; has [facility]; CM talked to pt/ son about HHC choices, [facility]; orders and clinical information faxed as requested; fax # [phone]  Expected Discharge Date:      08/28/2016            Expected Discharge Plan:  Home w Home Health Services     Discharge planning Services  CM Consult   Choice offered to:  Adult Children    HH Arranged:  PT HH Agency[facility]  [facility]  Status of Service:  In process, will continue to follow  Reola MosherChandler, Tmya Wigington L, RN,MHA,BSN [phone] 08/28/2016, 2:46 PM

## 2016-08-28 NOTE — Progress Notes (Signed)
Progress Note  Patient Name: [name] Date of Encounter: 08/28/2016  Primary Cardiologist: Dr. Lewayne Bunting  Subjective   Pacemaker generator change completed yesterday. She has been diuresed and had a transthoracic echo performed today. Echo shows normal wall motion and EF is 60-65% and severe LVH.  Patient doing significantly better today. No longer short of breath, no longer agitated, delirious or feeling a sense of impending doom. She is ready for home.   Inpatient Medications    Scheduled Meds: . aspirin  81 mg Oral Daily  . citalopram  20 mg Oral Daily  . clopidogrel  75 mg Oral Daily  . furosemide  40 mg Intravenous BID  . levothyroxine  112 mcg Oral QAC breakfast  . losartan  25 mg Oral QHS  . metoprolol tartrate  12.5 mg Oral BID  . multivitamin with minerals  1 tablet Oral Daily  . pravastatin  40 mg Oral QHS  . sodium chloride flush  3 mL Intravenous Q12H   Continuous Infusions:  PRN Meds: sodium chloride, acetaminophen, ALPRAZolam, loratadine, ondansetron (ZOFRAN) IV, oxyCODONE-acetaminophen, sodium chloride flush   Vital Signs    Vitals:   08/27/16 2300 08/28/16 0347 08/28/16 0638 08/28/16 1035  BP: (!) 189/73 (!) 159/94  (!) 141/67  Pulse: 63 66  60  Resp: 18 18    Temp: 98 F (36.7 C) 98.2 F (36.8 C)    TempSrc: Oral Oral    SpO2: 94% 94%    Weight:   133 lb 6.4 oz (60.5 kg)   Height:        Intake/Output Summary (Last 24 hours) at 08/28/16 1205 Last data filed at 08/28/16 1100  Gross per 24 hour  Intake              590 ml  Output             2400 ml  Net            -1810 ml   Filed Weights   08/26/16 2000 08/27/16 0500 08/28/16 0638  Weight: 138 lb 3.7 oz (62.7 kg) 137 lb 9 oz (62.4 kg) 133 lb 6.4 oz (60.5 kg)    Telemetry    Ventricular paced rhythm, HR 60 - Personally Reviewed  ECG    HR 61, NSR- Personally Reviewed  Physical Exam   GEN: No acute distress.   Neck: No JVD Cardiac: RRR, no murmurs, rubs, or gallops.   Respiratory: Clear to auscultation bilaterally. GI: Soft, nontender, non-distended  MS: No edema; No deformity. Neuro:  Nonfocal  Psych: Normal affect   Labs    Chemistry Recent Labs Lab 08/26/16 1200 08/27/16 0512 08/28/16 0437  NA 137 137 136  K 3.7 3.1* 3.3*  CL 98* 98* 95*  CO2 29 29 32  GLUCOSE 102* 87 88  BUN 14 16 18   CREATININE 0.74 0.76 0.82  CALCIUM 8.7* 8.3* 8.3*  PROT 6.2*  --   --   ALBUMIN 3.3*  --   --   AST 27  --   --   ALT 17  --   --   ALKPHOS 72  --   --   BILITOT 1.0  --   --   GFRNONAA >60 >60 59*  GFRAA >60 >60 >60  ANIONGAP 10 10 9      Hematology Recent Labs Lab 08/26/16 1200  WBC 7.3  RBC 3.87  HGB 11.9*  HCT 37.0  MCV 95.6  MCH 30.7  MCHC 32.2  RDW 13.4  PLT 234    Recent Labs Lab 08/26/16 1639  TROPIPOC 0.03    BNP Recent Labs Lab 08/26/16 1630  BNP 1,890.8*    Radiology    Dg Chest 2 View  Result Date: 08/26/2016 CLINICAL DATA:  Cough, shortness of breath for 3 days. EXAM: CHEST  2 VIEW COMPARISON:  02/28/2015 FINDINGS: Left pacer remains in place, unchanged. There is cardiomegaly. Left lower lobe atelectasis or scarring. No visible effusions. No acute bony abnormality. IMPRESSION: Cardiomegaly.  Left base scarring or atelectasis. Electronically Signed   By: Charlett NoseKevin  Dover M.D.   On: 08/26/2016 12:45    Cardiac Studies   Transthoracic Echocardiography 08/28/2016  Study Conclusions  - Left ventricle: The cavity size was normal. Wall thickness was   increased in a pattern of severe LVH. There was focal basal   hypertrophy. Systolic function was normal. The estimated ejection   fraction was in the range of 60% to 65%. Wall motion was normal;   there were no regional wall motion abnormalities. Doppler   parameters are consistent with abnormal left ventricular   relaxation (grade 1 diastolic dysfunction). - Aortic valve: Mildly calcified annulus. Moderately thickened,   moderately calcified leaflets. - Mitral  valve: There was mild regurgitation. - Left atrium: The atrium was mildly dilated.    Patient Profile     83 y.o. female  is a 83 y.o.femalewith a past medical history significant for symptomatic bradycardia, s/p Medtronic PPM in 2008, chronic CHF with LVEF 45-50% by echo in 2016, NSTEMI, HTN, dementia, hypothyroidism, dyslipidemia, prior TIA, multiple falls, etc. She lives at home and gets 24/7 care by her family. Apparently over the past several days she's been more short of breath with exertion and has had a nonproductive cough. She has also had a significant decrease in her appetite. Her dry weight is apparently around 130 lbs nd she weighed 138 at admission. Laboratory work indicated a markedly elevated BNP of 1890 (previously she had a BNP in 2016 which was 539).Initial troponin was 0.03. Otherwise lab work was fairly unremarkable. She did have a chest x-ray which showed cardiomegaly but no significant pulmonary edema. Interrogation of her pacemaker was performed in the emergency department which indicated that her devices and power saving mode. Recently she had a PACEARTinterrogation indicating that she was approaching ERI.  Assessment & Plan    Acute on chronic combined CHF: resolved, symptoms suspected due to suspected pacemaker dyssynchrony. Echo today showed severe LVH with EF of 60-65%. Mild mitral regurg. Transition to PO lasix 40 mg BID. Has diuresed well with IV lasix.  Creatinine 0.82 today.  Sinus node dysfunction s/p medtronic pacemaker: Pacemaker generator change performed yesterday successfully. She has been seen by EP who feel is she is safe for dc from an EP standpoint, they have arranged f/u with her as an outpatient.  Hypertension: 24 hour; Max 196/99 and Min 130/57.  - Currently poor control of hypertension.   - Currently on Metoprolol currently 6.25mg  in the AM and 12.5mg  in the PM and Losartan 25 mg daily, consider increasing Losartan.  Delirium: Resolved with  pacemaker generator change.   Suan HalterSigned, Tranell Wojtkiewicz G, PA-C  08/28/2016, 12:05 PM

## 2016-08-28 NOTE — Discharge Instructions (Signed)
Keep incision clean and dry for 10 days. °No driving for 2 days.  °You can remove outer dressing tomorrow. °Leave steri-strips (little pieces of tape) on until seen in the office for wound check appointment. °Call the office ([phone]) for redness, drainage, swelling, or fever. ° °

## 2016-08-28 NOTE — Progress Notes (Signed)
Pt's son requesting rehab facility for pt on d/c.  Notified Sarah, SW and instructed to get PT to see pt to evaluate her.  Pt's son made aware.  Amanda PeaNellie Jameer Storie, Charity fundraiserN.

## 2016-08-28 NOTE — Progress Notes (Signed)
Notified [name] with PT of pt's family request to assess pt for rehab.  [name] instructed that she will be up to see pt sometime this afternoon.  Pt's son [name] aware.  Verbalized understanding.  [name], Charity fundraiserN.

## 2016-08-28 NOTE — Progress Notes (Signed)
Patient rested well overnight. No respiratory distress reported.  [facility]. Did well. Patient is in bed. Family at bedside. Family expressed concern for further home care needs for patient. Nurse put in a Social work consult and informed family their concern will be addressed during the day. Family verbalized understanding. No other concerns at this time.

## 2016-08-28 NOTE — Clinical Social Work Note (Signed)
CSW acknowledges SNF consult. PT recommending HHPT and has family available at her home 24/7.  CSW signing off. Consult again if any other social work needs arise.  Charlynn CourtSarah Finlay Godbee, CSW [phone]

## 2016-08-28 NOTE — Evaluation (Signed)
Physical Therapy Evaluation Patient Details Name: [name] MRN: [id number] DOB: 02/14/1919 Today's Date: 08/28/2016   History of Present Illness  Pt is a 83 y/o female admitted secondary to increasing fatigue, DOE and cough. Pt with new diagnosis of CHF. PMH including but not limited to dementia, HTN, hx of CVA, NSTEMI 2016 and pacemaker placed in 2008.  Clinical Impression  Pt presented sitting EOB when therapist entered room. Prior to admission, pt's son reported that she ambulated with use of RW and required assistance with bathing. Pt's son also stated that family members were present with pt 24/7 at home. Pt currently requires min A with transfers and min guard for ambulation with RW. Pt's HR maintained stable throughout. Pt would continue to benefit from skilled physical therapy services at this time while admitted and after d/c to address his below listed limitations in order to improve his overall safety and independence with functional mobility.      Follow Up Recommendations Home health PT;Supervision/Assistance - 24 hour    Equipment Recommendations  None recommended by PT    Recommendations for Other Services       Precautions / Restrictions Precautions Precautions: Fall Restrictions Weight Bearing Restrictions: No      Mobility  Bed Mobility               General bed mobility comments: pt sitting EOB when therapist arrived and requested to remain sitting EOB at end of session  Transfers Overall transfer level: Needs assistance Equipment used: Rolling walker (2 wheeled) Transfers: Sit to/from [employer]Stand;Stand Pivot Transfers Sit to Stand: Min assist Stand pivot transfers: Min assist       General transfer comment: increased time, VC'ing for hand placement and min A for stability with transition standing from bed and with pivotal [facility]  Ambulation/Gait Ambulation/Gait assistance: Min guard Ambulation Distance (Feet): 50 Feet Assistive  device: Rolling walker (2 wheeled) Gait Pattern/deviations: Step-through pattern;Decreased step length - right;Decreased step length - left;Decreased stride length;Trunk flexed     General Gait Details: pt with modest instability during ambulation with RW; however, no LOB or need for physical assistance, min guard for safety  Stairs            Wheelchair Mobility    Modified Rankin (Stroke Patients Only)       Balance Overall balance assessment: Needs assistance Sitting-balance support: Feet supported Sitting balance-Leahy Scale: Good Sitting balance - Comments: pt able to sit EOB with no UE supports with supervision   Standing balance support: During functional activity;Bilateral upper extremity supported Standing balance-Leahy Scale: Poor Standing balance comment: pt reliant on bilateral UEs on RW                             Pertinent Vitals/Pain Pain Assessment: No/denies pain    Home Living Family/patient expects to be discharged to:: Private residence Living Arrangements: Children Available Help at Discharge: Family;Available 24 hours/day Type of Home: House Home Access: Stairs to enter Entrance Stairs-Rails: Right;Left;Can reach both Entrance Stairs-Number of Steps: 2 Home Layout: One level Home Equipment: Walker - 2 wheels;Shower seat      Prior Function Level of Independence: Needs assistance   Gait / Transfers Assistance Needed: pt ambulates with use of RW  ADL's / Homemaking Assistance Needed: pt's son reported that she is independent with dressing but requires assistance with bathing        Hand Dominance  Extremity/Trunk Assessment   Upper Extremity Assessment Upper Extremity Assessment: Generalized weakness    Lower Extremity Assessment Lower Extremity Assessment: Generalized weakness    Cervical / Trunk Assessment Cervical / Trunk Assessment: Kyphotic  Communication   Communication: HOH  Cognition  Arousal/Alertness: Awake/alert Behavior During Therapy: WFL for tasks assessed/performed Overall Cognitive Status: History of cognitive impairments - at baseline                 General Comments: pt with dementia at baseline; however, pt A&O and following commands appropriately throughout evaluation.    General Comments      Exercises     Assessment/Plan    PT Assessment Patient needs continued PT services  PT Problem List Decreased strength;Decreased balance;Decreased mobility;Decreased coordination;Decreased knowledge of use of DME;Decreased safety awareness;Cardiopulmonary status limiting activity       PT Treatment Interventions DME instruction;Gait training;Functional mobility training;Stair training;Therapeutic activities;Therapeutic exercise;Balance training;Neuromuscular re-education;Patient/family education    PT Goals (Current goals can be found in the Care Plan section)  Acute Rehab PT Goals Patient Stated Goal: return home with HHPT services to get stronger PT Goal Formulation: With patient/family Time For Goal Achievement: 09/11/16 Potential to Achieve Goals: Good    Frequency Min 3X/week   Barriers to discharge        Co-evaluation               End of Session Equipment Utilized During Treatment: Gait belt Activity Tolerance: Patient tolerated treatment well Patient left: in bed;with call bell/phone within reach;with family/visitor present (sitting EOB) Nurse Communication: Mobility status PT Visit Diagnosis: Other abnormalities of gait and mobility (R26.89)         Time: 1610-9604 PT Time Calculation (min) (ACUTE ONLY): 18 min   Charges:   PT Evaluation $PT Eval Moderate Complexity: 1 Procedure     PT G CodesAlessandra Bevels Sallyanne Birkhead 08/28/2016, 3:08 PM Deborah Chalk, PT, DPT [phone]

## 2016-08-28 NOTE — Progress Notes (Signed)
Doing well s/p generator change Breathing seems better Incision is without hematoma  Family requests PT  Routine wound care and EP follow-up arranged already Can go home from an EP standpoint.  Electrophysiology team to see as needed while here. Please call with questions.  Hillis RangeJames Thimothy Barretta MD, [facility]ACC 08/28/2016 8:24 AM

## 2016-08-28 NOTE — Telephone Encounter (Signed)
New message    TOC appt made for 3/20 4:15pm per Marlon Peliffany Greene.

## 2016-08-28 NOTE — Discharge Summary (Signed)
Discharge Summary    Patient ID: [name],  MRN: [id number], DOB/AGE: 01/25/1919 83 y.o.  Admit date: 08/26/2016 Discharge date: 08/28/2016  Primary Care Provider: [facility] Primary Cardiologist: Dr. Lewayne Bunting  Discharge Diagnoses    Principal Problem:   Acute on chronic combined CHF:  (08/28/2016) EF 60-65% Active Problems:   HYPERCHOLESTEROLEMIA   Essential hypertension   Delirium   Sinus node dysfunction s/p Medtronic pacemaker 2008   Hypokalemia   Pacemaker at end of battery life: pacemaker generator change on 08/28/2016  Allergies Allergies  Allergen Reactions  . Tape Other (See Comments)    PATIENT'S SKIN IS VERY THIN AND WILL BRUISE AND TEAR EASILY!!     History of Present Illness     83 y.o.femaleis a 83 y.o.femalewith a past medical history significant for symptomatic bradycardia, s/p Medtronic PPM in 2008, chronic CHF with LVEF 45-50% by echo in 2016, NSTEMI, HTN, dementia, hypothyroidism, dyslipidemia, prior TIA, multiple falls, etc. She lives at home and gets 24/7 care by her family. Apparently over the past several days she's been more short of breath with exertion and has had a nonproductive cough. She has also had a significant decrease in her appetite. Her dry weight is apparently around 130 lbs nd she weighed 138 at admission. Laboratory work indicateda markedly elevated BNP of 1890 (previously she had a BNP in 2016 which was 539).Initial troponin was 0.03. Otherwise lab work was fairly unremarkable. She did have a chest x-ray which showedcardiomegaly but no significant pulmonary edema. Interrogation of her pacemaker was performed in the emergency department which indicated that her devices and power saving mode. Recently she had a PACEARTinterrogation indicating that she was approaching ERI.  Hospital Course     Consultants: EP  Patient was in heart failure and short of breath on admission. She developed delirium as well. Her abrupt  change in symptoms was felt likely due to the fact that her pacemaker had reverted to VVI given the ERI status. She was seen by EP who felt she would benefit from generator change for optimization of pacemaker settings. Her symptoms resolved with diuresis (Weight down 7 lbs and diuresed 2.4 L) and pacemaker generator change. She is generally weak and recommendation is for in[atient rehab. Pt prefers to go home, therefore home rehab services have been arranged by case management. Dr. [name] has seen patient, recommends BMP to be rechecked at her follow-up appointment with Dr. [name] in 7-10 days. Wound check has also been arranged. Will change her lasix dose to 40 mg PO daily. Will add low dose potassium. Other medications to remain the same.  _____________  Discharge Vitals Blood pressure 129/79, pulse 63, temperature 98.4 F (36.9 C), temperature source Oral, resp. rate 18, height 5\' 2"  (1.575 m), weight 133 lb 6.4 oz (60.5 kg), SpO2 96 %.  Filed Weights   08/26/16 2000 08/27/16 0500 08/28/16 0638  Weight: 138 lb 3.7 oz (62.7 kg) 137 lb 9 oz (62.4 kg) 133 lb 6.4 oz (60.5 kg)    Labs & Radiologic Studies    CBC  Recent Labs  08/26/16 1200  WBC 7.3  NEUTROABS 5.1  HGB 11.9*  HCT 37.0  MCV 95.6  PLT 234   Basic Metabolic Panel  Recent Labs  08/27/16 0512 08/28/16 0437  NA 137 136  K 3.1* 3.3*  CL 98* 95*  CO2 29 32  GLUCOSE 87 88  BUN 16 18  CREATININE 0.76 0.82  CALCIUM 8.3* 8.3*   Liver  Function Tests  Recent Labs  08/26/16 1200  AST 27  ALT 17  ALKPHOS 72  BILITOT 1.0  PROT 6.2*  ALBUMIN 3.3*    Dg Chest 2 View Result Date: 08/26/2016 IMPRESSION: Cardiomegaly.  Left base scarring or atelectasis. Electronically Signed   By: Charlett Nose M.D.   On: 08/26/2016 12:45   Diagnostic Studies/Procedures    Transthoracic Echocardiography 08/28/2016  Study Conclusions  - Left ventricle: The cavity size was normal. Wall thickness was increased in a pattern of  severe LVH. There was focal basal hypertrophy. Systolic function was normal. The estimated ejection fraction was in the range of 60% to 65%. Wall motion was normal; there were no regional wall motion abnormalities. Doppler parameters are consistent with abnormal left ventricular relaxation (grade 1 diastolic dysfunction). - Aortic valve: Mildly calcified annulus. Moderately thickened, moderately calcified leaflets. - Mitral valve: There was mild regurgitation. - Left atrium: The atrium was mildly dilated. _____________   Disposition   Pt is being discharged home today in good condition.  Follow-up Plans & Appointments    Follow-up Information    Lewayne Bunting, MD. Go on 09/03/2016.   Specialty:  Cardiology Why: Appointment is at 3:00 pm for wound check with nurse and pacermaker interrogation and then see Dr. [name],  4:15 pm, please arrive 30 minutes early to have blood drawn to recheck your potassium. Contact information: [address] [place] [address] [phone]        Discharge Medications   Allergies as of 08/28/2016      Reactions   Tape Other (See Comments)   PATIENT'S SKIN IS VERY THIN AND WILL BRUISE AND TEAR EASILY!!      Medication List    TAKE these medications   aspirin 81 MG chewable tablet Chew 1 tablet (81 mg total) by mouth daily.   citalopram 20 MG tablet Commonly known as:  CELEXA Take 20 mg by mouth daily.   clopidogrel 75 MG tablet Commonly known as:  PLAVIX Take 1 tablet (75 mg total) by mouth daily.   furosemide 40 MG tablet Commonly known as:  LASIX Take 1 tablet (40 mg total) by mouth daily. What changed:  medication strength  how much to take   levothyroxine 112 MCG tablet Commonly known as:  SYNTHROID, LEVOTHROID Take 112 mcg by mouth daily before breakfast.   loratadine 10 MG tablet Commonly known as:  CLARITIN Take 10 mg by mouth daily as needed for allergies.   losartan 25 MG  tablet Commonly known as:  COZAAR Take 25 mg by mouth at bedtime.   metoprolol tartrate 25 MG tablet Commonly known as:  LOPRESSOR Take 0.5 tablets (12.5 mg total) by mouth 2 (two) times daily. What changed:  how much to take  when to take this  additional instructions   multivitamin tablet Take 1 tablet by mouth daily.   multivitamin-lutein Caps capsule Take 1 capsule by mouth daily.   nitroGLYCERIN 0.4 MG SL tablet Commonly known as:  NITROSTAT Place 1 tablet (0.4 mg total) under the tongue every 5 (five) minutes as needed for chest pain.   oxyCODONE-acetaminophen 7.5-325 MG tablet Commonly known as:  PERCOCET Take 0.5 tablets by mouth at bedtime as needed for pain.   potassium chloride 10 MEQ tablet Commonly known as:  K-DUR Take 1 tablet (10 mEq total) by mouth daily.   pravastatin 40 MG tablet Commonly known as:  PRAVACHOL Take 40 mg by mouth at bedtime.  Outstanding Labs/Studies   BMP    Duration of Discharge Encounter   Greater than 30 minutes including physician time.  Suan HalterSigned, Betsi Crespi G PA-C 08/28/2016, 4:43 PM

## 2016-08-28 NOTE — Progress Notes (Signed)
  Echocardiogram 2D Echocardiogram has been performed.  Dana SavoyCasey N [name] 08/28/2016, 9:55 AM

## 2016-08-28 NOTE — Progress Notes (Signed)
All d/c instructions explained and given to pt, son and her daughter-in-law.  Verbalized understanding.  D/c off floor to awaiting transport.  Amanda PeaNellie Seymour Pavlak, Charity fundraiserN.

## 2016-08-29 ENCOUNTER — Telehealth: Payer: Self-pay | Admitting: Internal Medicine

## 2016-08-29 ENCOUNTER — Telehealth: Payer: Self-pay | Admitting: *Deleted

## 2016-08-29 MED ORDER — POTASSIUM CHLORIDE ER 10 MEQ PO TBCR: 10.0000 meq | EXTENDED_RELEASE_TABLET | Freq: Every day | ORAL | 2 refills | Status: DC

## 2016-08-29 NOTE — Telephone Encounter (Signed)
GD Dana HaleMary Beth called back, I did not get phone call but I did send in the potassium refill.

## 2016-08-29 NOTE — Telephone Encounter (Signed)
New Message   *STAT* If patient is at the pharmacy, call can be transferred to refill team.   1. Which medications need to be refilled? (please list name of each medication and dose if known) potassium chloride (K-Dur) 10 meq tablet once daily  2. Which pharmacy/location (including street and city if local pharmacy) is medication to be sent to? [facility], [address], [place], [place], [phone]  3. Do they need a 30 day or 90 day supply? 30 day supply  Pts grand-daughter voiced we have to call the prescription due to they're not setup on any kind of electronic format.

## 2016-08-29 NOTE — Telephone Encounter (Signed)
Pt's DIL called to get potassium refill, it was sent to the pharmacy but she is not on the DPR so I informed her of this and ask her to have the GD [facility]. Pt expressed understanding and will have the GD [facility].

## 2016-08-30 NOTE — Telephone Encounter (Signed)
Left message with granddaughter to call back. Tried calling patient. Patient is very hard of hearing. Patient stated she was doing fine. Left detailed message on granddaughter's voicemail of appointment date and time.

## 2016-09-02 NOTE — Telephone Encounter (Signed)
I placed call to pt's grand daughter Shon Hale(Mary Beth) and left message to call office

## 2016-09-03 ENCOUNTER — Encounter: Payer: Medicare Other | Admitting: Internal Medicine

## 2016-09-03 ENCOUNTER — Telehealth: Payer: Self-pay | Admitting: Internal Medicine

## 2016-09-03 NOTE — Telephone Encounter (Signed)
I spoke with pt's granddaughter, Jeanella FlatteryMarybeth. She states her mother is staying with pt this week, no post hospital concerns, she is aware pt has appt 09/10/16 3 PM for Wound Check and Dr Ladona Ridgelaylor.

## 2016-09-03 NOTE — Telephone Encounter (Signed)
Follow Up:     [name]'s call from yesterday,she did not know what [name] wanted.

## 2016-09-03 NOTE — Telephone Encounter (Signed)
Dana FlatteryMarybeth ( granddaughter ) is returning a call to [name] about her grandmother

## 2016-09-03 NOTE — Telephone Encounter (Signed)
Unable to contact after 2 attempts.

## 2016-09-03 NOTE — Telephone Encounter (Signed)
See phone note 08/28/16. LMTCB

## 2016-09-10 ENCOUNTER — Other Ambulatory Visit: Payer: Self-pay

## 2016-09-10 ENCOUNTER — Ambulatory Visit (INDEPENDENT_AMBULATORY_CARE_PROVIDER_SITE_OTHER): Payer: Medicare Other | Admitting: Internal Medicine

## 2016-09-10 ENCOUNTER — Other Ambulatory Visit: Payer: Medicare Other

## 2016-09-10 ENCOUNTER — Ambulatory Visit: Payer: Medicare Other

## 2016-09-10 ENCOUNTER — Encounter (INDEPENDENT_AMBULATORY_CARE_PROVIDER_SITE_OTHER): Payer: Self-pay

## 2016-09-10 VITALS — BP 132/64 | HR 69 | Ht 62.0 in | Wt 136.0 lb

## 2016-09-10 DIAGNOSIS — I495 Sick sinus syndrome: Secondary | ICD-10-CM

## 2016-09-10 DIAGNOSIS — E876 Hypokalemia: Secondary | ICD-10-CM

## 2016-09-10 LAB — CUP PACEART INCLINIC DEVICE CHECK
Brady Statistic AS VP Percent: 0.2 %
Date Time Interrogation Session: 20180327175248
Implantable Lead Implant Date: 20080721
Implantable Lead Location: 753859
Implantable Lead Location: 753860
Implantable Lead Model: 5076
Lead Channel Pacing Threshold Amplitude: 0.5 V
Lead Channel Pacing Threshold Pulse Width: 0.4 ms
Lead Channel Pacing Threshold Pulse Width: 0.4 ms
Lead Channel Setting Pacing Amplitude: 2 V
Lead Channel Setting Sensing Sensitivity: 5.6 mV
MDC IDC LEAD IMPLANT DT: 20080721
MDC IDC MSMT LEADCHNL RA IMPEDANCE VALUE: 628 Ohm
MDC IDC MSMT LEADCHNL RA PACING THRESHOLD AMPLITUDE: 0.5 V
MDC IDC MSMT LEADCHNL RA SENSING INTR AMPL: 2.8 mV
MDC IDC MSMT LEADCHNL RV IMPEDANCE VALUE: 644 Ohm
MDC IDC MSMT LEADCHNL RV SENSING INTR AMPL: 15.68 mV
MDC IDC PG IMPLANT DT: 20180313
MDC IDC SET LEADCHNL RV PACING PULSEWIDTH: 0.4 ms
MDC IDC STAT BRADY AP VP PERCENT: 2 %
MDC IDC STAT BRADY AP VS PERCENT: 90.7 %
MDC IDC STAT BRADY AS VS PERCENT: 7.1 %

## 2016-09-10 NOTE — Progress Notes (Signed)
HPI [name] returns today for follow-up. She is a very pleasant 83 year old woman with a history of symptomatic bradycardia, status post pacemaker insertion, hypertension, with labile blood pressures. Her son who is with her today notes that her blood pressure has been good at home with systolics in the 120's. She denies peripheral edema. Minimal dyspnea though she is becoming more inactive. She reached ERI and underwent PPM gen change 2 weeks ago. She has done well since.  Allergies  Allergen Reactions  . Tape Other (See Comments)    PATIENT'S SKIN IS VERY THIN AND WILL BRUISE AND TEAR EASILY!!     Current Outpatient Prescriptions  Medication Sig Dispense Refill  . aspirin 81 MG chewable tablet Chew 1 tablet (81 mg total) by mouth daily.    . citalopram (CELEXA) 20 MG tablet Take 20 mg by mouth daily.     . clopidogrel (PLAVIX) 75 MG tablet Take 1 tablet (75 mg total) by mouth daily. 90 tablet 3  . furosemide (LASIX) 40 MG tablet Take 1 tablet (40 mg total) by mouth daily. 30 tablet 2  . levothyroxine (SYNTHROID, LEVOTHROID) 112 MCG tablet Take 112 mcg by mouth daily before breakfast.    . loratadine (CLARITIN) 10 MG tablet Take 10 mg by mouth daily as needed for allergies.     Marland Kitchen. losartan (COZAAR) 25 MG tablet Take 25 mg by mouth at bedtime.     . metoprolol tartrate (LOPRESSOR) 25 MG tablet Take 0.5 tablets (12.5 mg total) by mouth 2 (two) times daily. (Patient taking differently: Take 6.25-12.5 mg by mouth See admin instructions. 6.25 mg in the morning and 12.5 mg at bedtime ONLY IF SYSTOLIC B/P IS 100 OR GREATER AND PULSE RATE IS 60 BPM OR GREATER) 30 tablet 11  . Multiple Vitamin (MULTIVITAMIN) tablet Take 1 tablet by mouth daily.      . multivitamin-lutein (OCUVITE-LUTEIN) CAPS capsule Take 1 capsule by mouth daily.    . nitroGLYCERIN (NITROSTAT) 0.4 MG SL tablet Place 1 tablet (0.4 mg total) under the tongue every 5 (five) minutes as needed for chest pain. 30 tablet 0  .  oxyCODONE-acetaminophen (PERCOCET) 7.5-325 MG tablet Take 0.5 tablets by mouth at bedtime as needed for pain.    . potassium chloride (K-DUR) 10 MEQ tablet Take 1 tablet (10 mEq total) by mouth daily. 90 tablet 2  . pravastatin (PRAVACHOL) 40 MG tablet Take 40 mg by mouth at bedtime.       No current facility-administered medications for this visit.      Past Medical History:  Diagnosis Date  . Anxiety   . Chronic combined systolic and diastolic CHF (congestive heart failure) (HCC)   . Complication of anesthesia    ANXIETY  . Confusion Adm - 04/27/2013  . Dementia    "early" (02/28/2015)  . Depression   . History of stroke    a. Per chart.  Marland Kitchen. HTN (hypertension)   . Hypothyroidism   . Iron deficiency anemia, unspecified   . LV dysfunction    a. 2D Echo 03/01/15: poor image quality, septal HK, ? inferior wall as well, mild LVH, mild focal basal hypertrophy of septum, EF 45-50%, mildly dilated LA, PASP 32mmHg.  . Multiple falls    "over the last 6 months she's had 4" (02/28/2015)  . NSTEMI (non-ST elevated myocardial infarction) (HCC) 02/28/2015   a. Dx 02/2015 - treated medically.  . Osteoarthritis    "everywhere"  . Overactive bladder   . Pure  hypercholesterolemia   . Symptomatic bradycardia    a. Near syncope s/p Medtronic pacemaker 2008. b. s/p gen change in 08/2016.  Marland Kitchen TIA (transient ischemic attack) "many of them"    ROS:   All systems reviewed and negative except as noted in the HPI.   Past Surgical History:  Procedure Laterality Date  . ABDOMINAL HYSTERECTOMY    . CATARACT EXTRACTION, BILATERAL Bilateral 1960's  . INSERT / REPLACE / REMOVE PACEMAKER    . PACEMAKER INSERTION  01/05/07   Medtronic, dual chamber. Dr. Lewayne Bunting MD.  . PARTIAL HIP ARTHROPLASTY Bilateral   . PPM GENERATOR CHANGEOUT N/A 08/27/2016   Procedure: PPM Generator Changeout;  Surgeon: Hillis Range, MD;  Location: [facility];  Service: Cardiovascular;  Laterality: N/A;  . TOTAL KNEE  ARTHROPLASTY Bilateral      Family History  Problem Relation Age of Onset  . Tuberculosis Mother   . Coronary artery disease Other   . Stroke Other   . COPD Other      Social History   Social History  . Marital status: Widowed    Spouse name: N/A  . Number of children: N/A  . Years of education: N/A   Occupational History  . Not on file.   Social History Main Topics  . Smoking status: Never Smoker  . Smokeless tobacco: Never Used  . Alcohol use No  . Drug use: No  . Sexual activity: Not on file   Other Topics Concern  . Not on file   Social History Narrative   Lives with her husband.      BP 132/64   Pulse 69   Ht 5\' 2"  (1.575 m)   Wt 136 lb (61.7 kg)   BMI 24.87 kg/m   Physical Exam:  elderly appearing 83 year old woman, NAD HEENT: Unremarkable Neck:  6 cm JVD, no thyromegally Back:  No CVA tenderness Lungs:  Clear, with no wheezes, rales, or rhonchi. HEART:  Regular rate rhythm, no murmurs, no rubs, no clicks Abd:  soft, positive bowel sounds, no organomegally, no rebound, no guarding Ext:  2 plus pulses, no edema, no cyanosis, no clubbing Skin:  No rashes no nodules Neuro:  CN II through XII intact, motor grossly intact   DEVICE  Normal device function.  See PaceArt for details.   Assess/Plan: 1. Sinus node dysfunction - she is maintaining NSR very nicely. No change in meds. 2. PPM - her Medtronic DDD PM is working normally. She has undergone gen change. 3. HTN - her blood pressure today is high but her son notes that it is well controlled at home.  [name].D.

## 2016-09-10 NOTE — Patient Instructions (Signed)
Medication Instructions:    Your physician recommends that you continue on your current medications as directed. Please refer to the Current Medication list given to you today.  --- If you need a refill on your cardiac medications before your next appointment, please call your pharmacy. ---  Labwork:  BMET today  Testing/Procedures:  None ordered  Follow-Up:  Your physician recommends that you schedule a follow-up appointment in: 3 months with Dr. Ladona Ridgelaylor.  Thank you for choosing [facility]!!

## 2016-09-11 ENCOUNTER — Other Ambulatory Visit: Payer: Self-pay | Admitting: Internal Medicine

## 2016-09-11 ENCOUNTER — Ambulatory Visit: Payer: Medicare Other

## 2016-09-11 ENCOUNTER — Encounter: Payer: Self-pay | Admitting: Nurse Practitioner

## 2016-11-19 ENCOUNTER — Encounter: Payer: Self-pay | Admitting: Internal Medicine

## 2016-11-26 ENCOUNTER — Other Ambulatory Visit: Payer: Self-pay | Admitting: *Deleted

## 2016-11-26 MED ORDER — FUROSEMIDE 40 MG PO TABS: 40.0000 mg | ORAL_TABLET | Freq: Every day | ORAL | 2 refills | Status: DC

## 2016-11-28 ENCOUNTER — Encounter: Payer: Medicare Other | Admitting: Internal Medicine

## 2016-12-13 ENCOUNTER — Encounter: Payer: Medicare Other | Admitting: Internal Medicine

## 2017-01-29 ENCOUNTER — Encounter: Payer: Self-pay | Admitting: Internal Medicine

## 2017-02-11 ENCOUNTER — Other Ambulatory Visit: Payer: Self-pay | Admitting: Internal Medicine

## 2017-02-21 ENCOUNTER — Encounter (INDEPENDENT_AMBULATORY_CARE_PROVIDER_SITE_OTHER): Payer: Self-pay

## 2017-02-21 ENCOUNTER — Encounter: Payer: Self-pay | Admitting: Internal Medicine

## 2017-02-21 ENCOUNTER — Ambulatory Visit (INDEPENDENT_AMBULATORY_CARE_PROVIDER_SITE_OTHER): Payer: Medicare Other | Admitting: Internal Medicine

## 2017-02-21 VITALS — BP 120/78 | HR 75 | Ht 59.0 in | Wt 134.8 lb

## 2017-02-21 DIAGNOSIS — I10 Essential (primary) hypertension: Secondary | ICD-10-CM

## 2017-02-21 DIAGNOSIS — I495 Sick sinus syndrome: Secondary | ICD-10-CM | POA: Diagnosis not present

## 2017-02-21 DIAGNOSIS — Z950 Presence of cardiac pacemaker: Secondary | ICD-10-CM | POA: Diagnosis not present

## 2017-02-21 NOTE — Progress Notes (Signed)
HPI [name] returns today for ongoing evaluation and management of sinus node dysfunction, status post pacemaker insertion. She was hospitalized back in March with acute on chronic systolic and diastolic heart failure, and since then she has been stable. Her symptoms are class II. Her daughter who is with her today states that she has become more sedentary. She prefers to give orders. She has not had syncope. She has very mild peripheral edema. Allergies  Allergen Reactions  . Tape Other (See Comments)    PATIENT'S SKIN IS VERY THIN AND WILL BRUISE AND TEAR EASILY!!     Current Outpatient Prescriptions  Medication Sig Dispense Refill  . aspirin 81 MG chewable tablet Chew 1 tablet (81 mg total) by mouth daily.    . citalopram (CELEXA) 20 MG tablet Take 20 mg by mouth daily.     . clopidogrel (PLAVIX) 75 MG tablet TAKE ONE TABLET BY MOUTH ONCE DAILY 90 tablet 3  . furosemide (LASIX) 40 MG tablet Take 1 tablet (40 mg total) by mouth daily. 90 tablet 2  . levothyroxine (SYNTHROID, LEVOTHROID) 112 MCG tablet Take 112 mcg by mouth daily before breakfast.    . loratadine (CLARITIN) 10 MG tablet Take 10 mg by mouth daily as needed for allergies.     Marland Kitchen losartan (COZAAR) 25 MG tablet Take 25 mg by mouth at bedtime.     . metoprolol tartrate (LOPRESSOR) 25 MG tablet Take 0.5 tablets (12.5 mg total) by mouth 2 (two) times daily. (Patient taking differently: Take 6.25-12.5 mg by mouth See admin instructions. 6.25 mg in the morning and 12.5 mg at bedtime ONLY IF SYSTOLIC B/P IS 100 OR GREATER AND PULSE RATE IS 60 BPM OR GREATER) 30 tablet 11  . Multiple Vitamin (MULTIVITAMIN) tablet Take 1 tablet by mouth daily.      . multivitamin-lutein (OCUVITE-LUTEIN) CAPS capsule Take 1 capsule by mouth daily.    . nitroGLYCERIN (NITROSTAT) 0.4 MG SL tablet Place 1 tablet (0.4 mg total) under the tongue every 5 (five) minutes as needed for chest pain. 30 tablet 0  . oxyCODONE-acetaminophen (PERCOCET)  7.5-325 MG tablet Take 0.5 tablets by mouth at bedtime as needed for pain.    . potassium chloride (K-DUR) 10 MEQ tablet Take 1 tablet (10 mEq total) by mouth daily. 90 tablet 2  . pravastatin (PRAVACHOL) 40 MG tablet Take 40 mg by mouth at bedtime.       No current facility-administered medications for this visit.      Past Medical History:  Diagnosis Date  . Anxiety   . Chronic combined systolic and diastolic CHF (congestive heart failure) (HCC)   . Complication of anesthesia    ANXIETY  . Confusion Adm - 04/27/2013  . Dementia    "early" (02/28/2015)  . Depression   . History of stroke    a. Per chart.  Marland Kitchen HTN (hypertension)   . Hypothyroidism   . Iron deficiency anemia, unspecified   . LV dysfunction    a. 2D Echo 03/01/15: poor image quality, septal HK, ? inferior wall as well, mild LVH, mild focal basal hypertrophy of septum, EF 45-50%, mildly dilated LA, PASP .  . Multiple falls    "over the last 6 months she's had 4" (02/28/2015)  . NSTEMI (non-ST elevated myocardial infarction) (HCC) 02/28/2015   a. Dx 02/2015 - treated medically.  . Osteoarthritis    "everywhere"  . Overactive bladder   . Pure hypercholesterolemia   . Symptomatic bradycardia  a. Near syncope s/p Medtronic pacemaker 2008. b. s/p gen change in 08/2016.  Marland Kitchen. TIA (transient ischemic attack) "many of them"    ROS:   All systems reviewed and negative except as noted in the HPI.   Past Surgical History:  Procedure Laterality Date  . ABDOMINAL HYSTERECTOMY    . CATARACT EXTRACTION, BILATERAL Bilateral 1960's  . INSERT / REPLACE / REMOVE PACEMAKER    . PACEMAKER INSERTION  01/05/07   Medtronic, dual chamber. Dr. Lewayne BuntingGregg Jeromey Kruer MD.  . PARTIAL HIP ARTHROPLASTY Bilateral   . PPM GENERATOR CHANGEOUT N/A 08/27/2016   Procedure: PPM Generator Changeout;  Surgeon: Hillis RangeJames Allred, MD;  Location: [facility];  Service: Cardiovascular;  Laterality: N/A;  . TOTAL KNEE ARTHROPLASTY Bilateral      Family  History  Problem Relation Age of Onset  . Tuberculosis Mother   . Coronary artery disease Other   . Stroke Other   . COPD Other      Social History   Social History  . Marital status: Widowed    Spouse name: N/A  . Number of children: N/A  . Years of education: N/A   Occupational History  . Not on file.   Social History Main Topics  . Smoking status: Never Smoker  . Smokeless tobacco: Never Used  . Alcohol use No  . Drug use: No  . Sexual activity: Not on file   Other Topics Concern  . Not on file   Social History Narrative   Lives with her husband.      BP 120/78   Pulse 75   Ht 4\' 11"  (1.499 m)   Wt 134 lb 12.8 oz (61.1 kg)   BMI 27.23 kg/m   Physical Exam:  Well appearing 83 year old woman, NAD HEENT: Unremarkable Neck:  No JVD, no thyromegally Lymphatics:  No adenopathy Back:  No CVA tenderness Lungs:  Clear, with no wheezes, rales, or rhonchi. Well-healed pacemaker incision HEART:  Regular rate rhythm, no murmurs, no rubs, no clicks Abd:  soft, positive bowel sounds, no organomegally, no rebound, no guarding Ext:  2 plus pulses, no edema, no cyanosis, no clubbing Skin:  No rashes no nodules Neuro:  CN II through XII intact, motor grossly intact  EKG - sinus rhythm with atrial pacing, and occasional PVCs  DEVICE  Normal device function.  See PaceArt for details.   Assess/Plan: 1. Sinus node dysfunction - she is asymptomatic status post pacemaker insertion 2. Permanent pacemaker - her Medtronic dual-chamber pacemaker is working normally. She underwent pacemaker generator change out approximately 6 months ago and her new device is working normally. 3. Chronic systolic and diastolic heart failure - today she appears to be euvolemic. She is encouraged to maintain a low-sodium diet. She will continue her current medications. I did encourage the patient to increase her physical activity.  [name], M.D.

## 2017-02-21 NOTE — Patient Instructions (Signed)
Medication Instructions:  Your physician recommends that you continue on your current medications as directed. Please refer to the Current Medication list given to you today.  Labwork: None ordered.  Testing/Procedures: None ordered.  Follow-Up: Your physician wants you to follow-up in: 9 months with Dr. Ladona Ridgelaylor.   You will receive a reminder letter in the mail two months in advance. If you don't receive a letter, please call our office to schedule the follow-up appointment.  Remote monitoring is used to monitor your Pacemaker from home. This monitoring reduces the number of office visits required to check your device to one time per year. It allows us to keep an eye on the functioning of your device to ensure it is working properly. You are scheduled for a device check from home on 05/26/2017. You may send your transmission at any time that day. If you have a wireless device, the transmission will be sent automatically. After your physician reviews your transmission, you will receive a postcard with your next transmission date.    Any Other Special Instructions Will Be Listed Below (If Applicable).     If you need a refill on your cardiac medications before your next appointment, please call your pharmacy.

## 2017-02-28 LAB — CUP PACEART INCLINIC DEVICE CHECK
Battery Impedance: 100 Ohm
Brady Statistic AP VS Percent: 57 %
Brady Statistic AS VS Percent: 42 %
Implantable Lead Implant Date: 20080721
Implantable Lead Location: 753860
Implantable Lead Model: 5076
Implantable Lead Model: 5076
Lead Channel Impedance Value: 658 Ohm
Lead Channel Pacing Threshold Amplitude: 0.75 V
Lead Channel Pacing Threshold Pulse Width: 0.4 ms
Lead Channel Sensing Intrinsic Amplitude: 15.67 mV
Lead Channel Sensing Intrinsic Amplitude: 2.8 mV
Lead Channel Setting Pacing Amplitude: 2 V
Lead Channel Setting Pacing Pulse Width: 0.4 ms
Lead Channel Setting Sensing Sensitivity: 5.6 mV
MDC IDC LEAD IMPLANT DT: 20080721
MDC IDC LEAD LOCATION: 753859
MDC IDC MSMT BATTERY REMAINING LONGEVITY: 154 mo
MDC IDC MSMT BATTERY VOLTAGE: 2.79 V
MDC IDC MSMT LEADCHNL RA IMPEDANCE VALUE: 578 Ohm
MDC IDC MSMT LEADCHNL RA PACING THRESHOLD AMPLITUDE: 0.5 V
MDC IDC MSMT LEADCHNL RA PACING THRESHOLD PULSEWIDTH: 0.4 ms
MDC IDC PG IMPLANT DT: 20180313
MDC IDC SESS DTM: 20180907200654
MDC IDC SET LEADCHNL RV PACING AMPLITUDE: 2.5 V
MDC IDC STAT BRADY AP VP PERCENT: 1 %
MDC IDC STAT BRADY AS VP PERCENT: 1 %

## 2017-04-24 DIAGNOSIS — M1990 Unspecified osteoarthritis, unspecified site: Secondary | ICD-10-CM | POA: Diagnosis not present

## 2017-04-24 DIAGNOSIS — E7800 Pure hypercholesterolemia, unspecified: Secondary | ICD-10-CM | POA: Diagnosis not present

## 2017-04-24 DIAGNOSIS — I509 Heart failure, unspecified: Secondary | ICD-10-CM | POA: Diagnosis not present

## 2017-04-24 DIAGNOSIS — Z23 Encounter for immunization: Secondary | ICD-10-CM | POA: Diagnosis not present

## 2017-04-24 DIAGNOSIS — D649 Anemia, unspecified: Secondary | ICD-10-CM | POA: Diagnosis not present

## 2017-05-13 DIAGNOSIS — B379 Candidiasis, unspecified: Secondary | ICD-10-CM | POA: Diagnosis not present

## 2017-05-13 DIAGNOSIS — I10 Essential (primary) hypertension: Secondary | ICD-10-CM | POA: Diagnosis not present

## 2017-05-13 DIAGNOSIS — Z6823 Body mass index (BMI) 23.0-23.9, adult: Secondary | ICD-10-CM | POA: Diagnosis not present

## 2017-05-13 DIAGNOSIS — N390 Urinary tract infection, site not specified: Secondary | ICD-10-CM | POA: Diagnosis not present

## 2017-05-13 DIAGNOSIS — E039 Hypothyroidism, unspecified: Secondary | ICD-10-CM | POA: Diagnosis not present

## 2017-05-26 ENCOUNTER — Encounter: Payer: Medicare Other | Admitting: *Deleted

## 2017-05-30 ENCOUNTER — Encounter: Payer: Self-pay | Admitting: Cardiology

## 2017-08-08 ENCOUNTER — Encounter: Payer: Medicare Other | Admitting: Internal Medicine

## 2017-08-28 ENCOUNTER — Encounter: Payer: Medicare Other | Admitting: Physician Assistant

## 2017-10-02 ENCOUNTER — Other Ambulatory Visit: Payer: Self-pay | Admitting: Internal Medicine

## 2017-10-02 MED ORDER — CLOPIDOGREL BISULFATE 75 MG PO TABS: 75.0000 mg | ORAL_TABLET | Freq: Every day | ORAL | 1 refills | Status: AC

## 2017-10-08 ENCOUNTER — Encounter (HOSPITAL_COMMUNITY): Payer: Self-pay | Admitting: Internal Medicine

## 2017-10-08 ENCOUNTER — Emergency Department (HOSPITAL_COMMUNITY): Payer: Medicare Other

## 2017-10-08 ENCOUNTER — Inpatient Hospital Stay (HOSPITAL_COMMUNITY)
Admission: EM | Admit: 2017-10-08 | Discharge: 2017-10-14 | DRG: 552 | Disposition: A | Discharge status: Skilled Nursing Facility | Payer: Medicare Other | Attending: Internal Medicine | Admitting: Internal Medicine

## 2017-10-08 DIAGNOSIS — F0281 Dementia in other diseases classified elsewhere with behavioral disturbance: Secondary | ICD-10-CM | POA: Diagnosis not present

## 2017-10-08 DIAGNOSIS — I5032 Chronic diastolic (congestive) heart failure: Secondary | ICD-10-CM | POA: Diagnosis not present

## 2017-10-08 DIAGNOSIS — E785 Hyperlipidemia, unspecified: Secondary | ICD-10-CM | POA: Diagnosis not present

## 2017-10-08 DIAGNOSIS — M545 Low back pain: Secondary | ICD-10-CM | POA: Diagnosis not present

## 2017-10-08 DIAGNOSIS — S32030A Wedge compression fracture of third lumbar vertebra, initial encounter for closed fracture: Principal | ICD-10-CM | POA: Diagnosis present

## 2017-10-08 DIAGNOSIS — E039 Hypothyroidism, unspecified: Secondary | ICD-10-CM | POA: Diagnosis not present

## 2017-10-08 DIAGNOSIS — Z823 Family history of stroke: Secondary | ICD-10-CM

## 2017-10-08 DIAGNOSIS — Y92009 Unspecified place in unspecified non-institutional (private) residence as the place of occurrence of the external cause: Secondary | ICD-10-CM

## 2017-10-08 DIAGNOSIS — S32040A Wedge compression fracture of fourth lumbar vertebra, initial encounter for closed fracture: Secondary | ICD-10-CM

## 2017-10-08 DIAGNOSIS — Z9842 Cataract extraction status, left eye: Secondary | ICD-10-CM | POA: Diagnosis not present

## 2017-10-08 DIAGNOSIS — S32049D Unspecified fracture of fourth lumbar vertebra, subsequent encounter for fracture with routine healing: Secondary | ICD-10-CM | POA: Diagnosis not present

## 2017-10-08 DIAGNOSIS — F0390 Unspecified dementia without behavioral disturbance: Secondary | ICD-10-CM | POA: Diagnosis present

## 2017-10-08 DIAGNOSIS — Z66 Do not resuscitate: Secondary | ICD-10-CM | POA: Diagnosis not present

## 2017-10-08 DIAGNOSIS — Z950 Presence of cardiac pacemaker: Secondary | ICD-10-CM | POA: Diagnosis present

## 2017-10-08 DIAGNOSIS — Z91048 Other nonmedicinal substance allergy status: Secondary | ICD-10-CM

## 2017-10-08 DIAGNOSIS — W1830XA Fall on same level, unspecified, initial encounter: Secondary | ICD-10-CM | POA: Diagnosis present

## 2017-10-08 DIAGNOSIS — N179 Acute kidney failure, unspecified: Secondary | ICD-10-CM | POA: Diagnosis not present

## 2017-10-08 DIAGNOSIS — Z7189 Other specified counseling: Secondary | ICD-10-CM | POA: Diagnosis not present

## 2017-10-08 DIAGNOSIS — Z96643 Presence of artificial hip joint, bilateral: Secondary | ICD-10-CM | POA: Diagnosis not present

## 2017-10-08 DIAGNOSIS — Z79890 Hormone replacement therapy (postmenopausal): Secondary | ICD-10-CM

## 2017-10-08 DIAGNOSIS — R0781 Pleurodynia: Secondary | ICD-10-CM | POA: Diagnosis not present

## 2017-10-08 DIAGNOSIS — R319 Hematuria, unspecified: Secondary | ICD-10-CM | POA: Diagnosis not present

## 2017-10-08 DIAGNOSIS — Z8673 Personal history of transient ischemic attack (TIA), and cerebral infarction without residual deficits: Secondary | ICD-10-CM

## 2017-10-08 DIAGNOSIS — M25551 Pain in right hip: Secondary | ICD-10-CM | POA: Diagnosis not present

## 2017-10-08 DIAGNOSIS — I5042 Chronic combined systolic (congestive) and diastolic (congestive) heart failure: Secondary | ICD-10-CM | POA: Diagnosis not present

## 2017-10-08 DIAGNOSIS — R739 Hyperglycemia, unspecified: Secondary | ICD-10-CM | POA: Diagnosis not present

## 2017-10-08 DIAGNOSIS — Z9841 Cataract extraction status, right eye: Secondary | ICD-10-CM | POA: Diagnosis not present

## 2017-10-08 DIAGNOSIS — I252 Old myocardial infarction: Secondary | ICD-10-CM

## 2017-10-08 DIAGNOSIS — H9190 Unspecified hearing loss, unspecified ear: Secondary | ICD-10-CM | POA: Diagnosis present

## 2017-10-08 DIAGNOSIS — R030 Elevated blood-pressure reading, without diagnosis of hypertension: Secondary | ICD-10-CM | POA: Diagnosis not present

## 2017-10-08 DIAGNOSIS — I110 Hypertensive heart disease with heart failure: Secondary | ICD-10-CM | POA: Diagnosis not present

## 2017-10-08 DIAGNOSIS — Z515 Encounter for palliative care: Secondary | ICD-10-CM | POA: Diagnosis not present

## 2017-10-08 DIAGNOSIS — Z7982 Long term (current) use of aspirin: Secondary | ICD-10-CM

## 2017-10-08 DIAGNOSIS — M549 Dorsalgia, unspecified: Secondary | ICD-10-CM | POA: Diagnosis not present

## 2017-10-08 DIAGNOSIS — K5900 Constipation, unspecified: Secondary | ICD-10-CM | POA: Diagnosis not present

## 2017-10-08 DIAGNOSIS — R52 Pain, unspecified: Secondary | ICD-10-CM

## 2017-10-08 DIAGNOSIS — Z96653 Presence of artificial knee joint, bilateral: Secondary | ICD-10-CM | POA: Diagnosis not present

## 2017-10-08 DIAGNOSIS — N390 Urinary tract infection, site not specified: Secondary | ICD-10-CM | POA: Diagnosis present

## 2017-10-08 DIAGNOSIS — S32000A Wedge compression fracture of unspecified lumbar vertebra, initial encounter for closed fracture: Secondary | ICD-10-CM | POA: Diagnosis not present

## 2017-10-08 DIAGNOSIS — E7800 Pure hypercholesterolemia, unspecified: Secondary | ICD-10-CM | POA: Diagnosis not present

## 2017-10-08 DIAGNOSIS — I2510 Atherosclerotic heart disease of native coronary artery without angina pectoris: Secondary | ICD-10-CM | POA: Diagnosis present

## 2017-10-08 DIAGNOSIS — Z8249 Family history of ischemic heart disease and other diseases of the circulatory system: Secondary | ICD-10-CM

## 2017-10-08 DIAGNOSIS — G8911 Acute pain due to trauma: Secondary | ICD-10-CM | POA: Diagnosis not present

## 2017-10-08 DIAGNOSIS — M25552 Pain in left hip: Secondary | ICD-10-CM | POA: Diagnosis not present

## 2017-10-08 DIAGNOSIS — Z7902 Long term (current) use of antithrombotics/antiplatelets: Secondary | ICD-10-CM

## 2017-10-08 DIAGNOSIS — E876 Hypokalemia: Secondary | ICD-10-CM | POA: Diagnosis not present

## 2017-10-08 DIAGNOSIS — R296 Repeated falls: Secondary | ICD-10-CM | POA: Diagnosis present

## 2017-10-08 DIAGNOSIS — S0990XA Unspecified injury of head, initial encounter: Secondary | ICD-10-CM | POA: Diagnosis not present

## 2017-10-08 DIAGNOSIS — E875 Hyperkalemia: Secondary | ICD-10-CM | POA: Diagnosis not present

## 2017-10-08 DIAGNOSIS — Z90710 Acquired absence of both cervix and uterus: Secondary | ICD-10-CM

## 2017-10-08 DIAGNOSIS — S79912A Unspecified injury of left hip, initial encounter: Secondary | ICD-10-CM | POA: Diagnosis not present

## 2017-10-08 DIAGNOSIS — I10 Essential (primary) hypertension: Secondary | ICD-10-CM | POA: Diagnosis not present

## 2017-10-08 DIAGNOSIS — S3992XA Unspecified injury of lower back, initial encounter: Secondary | ICD-10-CM | POA: Diagnosis not present

## 2017-10-08 DIAGNOSIS — S79911A Unspecified injury of right hip, initial encounter: Secondary | ICD-10-CM | POA: Diagnosis not present

## 2017-10-08 DIAGNOSIS — W19XXXD Unspecified fall, subsequent encounter: Secondary | ICD-10-CM | POA: Diagnosis not present

## 2017-10-08 LAB — CBC WITH DIFFERENTIAL/PLATELET
BASOS ABS: 0 10*3/uL (ref 0.0–0.1)
BASOS PCT: 0 %
Eosinophils Absolute: 0 10*3/uL (ref 0.0–0.7)
Eosinophils Relative: 0 %
HEMATOCRIT: 40.4 % (ref 36.0–46.0)
HEMOGLOBIN: 12.9 g/dL (ref 12.0–15.0)
Lymphocytes Relative: 9 %
Lymphs Abs: 1.1 10*3/uL (ref 0.7–4.0)
MCH: 30.8 pg (ref 26.0–34.0)
MCHC: 31.9 g/dL (ref 30.0–36.0)
MCV: 96.4 fL (ref 78.0–100.0)
MONOS PCT: 9 %
Monocytes Absolute: 1.1 10*3/uL — ABNORMAL HIGH (ref 0.1–1.0)
NEUTROS ABS: 9.9 10*3/uL — AB (ref 1.7–7.7)
NEUTROS PCT: 82 %
Platelets: 298 10*3/uL (ref 150–400)
RBC: 4.19 MIL/uL (ref 3.87–5.11)
RDW: 13.9 % (ref 11.5–15.5)
WBC: 12.1 10*3/uL — AB (ref 4.0–10.5)

## 2017-10-08 LAB — BASIC METABOLIC PANEL
Anion gap: 8 (ref 5–15)
BUN: 19 mg/dL (ref 6–20)
CALCIUM: 7.1 mg/dL — AB (ref 8.9–10.3)
CO2: 24 mmol/L (ref 22–32)
CREATININE: 0.69 mg/dL (ref 0.44–1.00)
Chloride: 110 mmol/L (ref 101–111)
GFR calc non Af Amer: >60 mL/min (ref >60)
Glucose, Bld: 91 mg/dL (ref 65–99)
Potassium: 3.1 mmol/L — ABNORMAL LOW (ref 3.5–5.1)
SODIUM: 142 mmol/L (ref 135–145)

## 2017-10-08 LAB — MAGNESIUM: MAGNESIUM: 1.2 mg/dL — AB (ref 1.7–2.4)

## 2017-10-08 MED ORDER — HYDRALAZINE HCL 20 MG/ML IJ SOLN
5.0000 mg | Freq: Once | INTRAMUSCULAR | Status: AC
  Administered 2017-10-08: 5 mg via INTRAVENOUS
  Filled 2017-10-08: qty 1

## 2017-10-08 MED ORDER — ACETAMINOPHEN 325 MG PO TABS
650.0000 mg | ORAL_TABLET | Freq: Once | ORAL | Status: DC
  Filled 2017-10-08: qty 2

## 2017-10-08 MED ORDER — HYDRALAZINE HCL 20 MG/ML IJ SOLN
10.0000 mg | Freq: Once | INTRAMUSCULAR | Status: AC
Start: 2017-10-08 — End: 2017-10-08
  Administered 2017-10-08: 10 mg via INTRAVENOUS
  Filled 2017-10-08: qty 1

## 2017-10-08 MED ORDER — POTASSIUM CHLORIDE 10 MEQ/100ML IV SOLN
10.0000 meq | Freq: Once | INTRAVENOUS | Status: AC
  Administered 2017-10-08: 10 meq via INTRAVENOUS
  Filled 2017-10-08: qty 100

## 2017-10-08 MED ORDER — ONDANSETRON HCL 4 MG/2ML IJ SOLN
4.0000 mg | Freq: Once | INTRAMUSCULAR | Status: AC
  Administered 2017-10-08: 4 mg via INTRAVENOUS
  Filled 2017-10-08: qty 2

## 2017-10-08 MED ORDER — HYDROCODONE-ACETAMINOPHEN 5-325 MG PO TABS
1.0000 | ORAL_TABLET | Freq: Once | ORAL | Status: AC
  Administered 2017-10-08: 1 via ORAL
  Filled 2017-10-08: qty 1

## 2017-10-08 MED ORDER — FENTANYL CITRATE (PF) 100 MCG/2ML IJ SOLN
50.0000 ug | Freq: Once | INTRAMUSCULAR | Status: AC
Start: 2017-10-08 — End: 2017-10-08
  Administered 2017-10-08: 50 ug via INTRAVENOUS
  Filled 2017-10-08: qty 2

## 2017-10-08 NOTE — ED Provider Notes (Signed)
[facility]-EMERGENCY DEPT Provider Note   CSN: [id number] Arrival date & time: 10/08/17  1255     History   Chief Complaint Chief Complaint  Patient presents with  . Fall    HPI [name] is a 84 y.o. female with a past medical history significant for symptomatic bradycardia, s/p Medtronic PPM in 2008, chronic CHF with LVEF 45-50% by echo in 2016, NSTEMI, HTN, dementia, hypothyroidism, dyslipidemia, prior TIA, multiple falls who presents to the emergency department for fall. Patient lives at home with family and receives 24/7 care. Patient was apparently receiving assistance when ambulating to the bathroom when she fell onto her buttocks. No head trauma or LOC. Since that time the patient has been having b/l hip pain (history of replacement) as well as low back pain. Patient has had decreased ambulating since the event but family reports she has poor ambulation at baseline. They report that she took an OxyContin this morning for her symptoms. No other complaints at this time.   HPI  Past Medical History:  Diagnosis Date  . Anxiety   . Chronic combined systolic and diastolic CHF (congestive heart failure) (HCC)   . Complication of anesthesia    ANXIETY  . Confusion Adm - 04/27/2013  . Dementia    "early" (02/28/2015)  . Depression   . History of stroke    a. Per chart.  Marland Kitchen HTN (hypertension)   . Hypothyroidism   . Iron deficiency anemia, unspecified   . LV dysfunction    a. 2D Echo 03/01/15: poor image quality, septal HK, ? inferior wall as well, mild LVH, mild focal basal hypertrophy of septum, EF 45-50%, mildly dilated LA, PASP .  . Multiple falls    "over the last 6 months she's had 4" (02/28/2015)  . NSTEMI (non-ST elevated myocardial infarction) (HCC) 02/28/2015   a. Dx 02/2015 - treated medically.  . Osteoarthritis    "everywhere"  . Overactive bladder   . Pure hypercholesterolemia   . Symptomatic bradycardia    a. Near syncope s/p  Medtronic pacemaker 2008. b. s/p gen change in 08/2016.  Marland Kitchen TIA (transient ischemic attack) "many of them"    Patient Active Problem List   Diagnosis Date Noted  . Acute on chronic combined CHF:  (08/28/2016) EF 60-65% 08/28/2016  . Pacemaker at end of battery life: pacemaker generator change on 08/28/2016 08/26/2016  . Acute diastolic heart failure (HCC) 08/26/2016  . UTI (lower urinary tract infection) 02/28/2015  . NSTEMI (non-ST elevated myocardial infarction) (HCC) 02/28/2015  . SOB (shortness of breath) 02/28/2015  . Hypokalemia 02/28/2015  . Chronic diastolic CHF (congestive heart failure) (HCC) 06/13/2014  . Chest discomfort 06/01/2014  . Dementia 06/01/2014  . Sinus node dysfunction s/p Medtronic pacemaker 2008 06/01/2014  . Delirium 04/27/2013  . H/O: CVA (cerebrovascular accident) 04/27/2013  . Hypothyroidism 02/24/2009  . HYPERCHOLESTEROLEMIA 02/24/2009  . Iron deficiency anemia 02/24/2009  . Essential hypertension 02/24/2009  . BRADYCARDIA 02/24/2009  . OSTEOARTHRITIS 02/24/2009  . DIZZINESS 02/24/2009  . PACEMAKER, PERMANENT 02/24/2009    Past Surgical History:  Procedure Laterality Date  . ABDOMINAL HYSTERECTOMY    . CATARACT EXTRACTION, BILATERAL Bilateral 1960's  . INSERT / REPLACE / REMOVE PACEMAKER    . PACEMAKER INSERTION  01/05/07   Medtronic, dual chamber. Dr. Lewayne Bunting MD.  . PARTIAL HIP ARTHROPLASTY Bilateral   . PPM GENERATOR CHANGEOUT N/A 08/27/2016   Procedure: PPM Generator Changeout;  Surgeon: Hillis Range, MD;  Location: [facility] INVASIVE CV LAB;  Service: Cardiovascular;  Laterality: N/A;  . TOTAL KNEE ARTHROPLASTY Bilateral      OB History   None      Home Medications    Prior to Admission medications   Medication Sig Start Date End Date Taking? Authorizing Provider  aspirin 81 MG chewable tablet Chew 1 tablet (81 mg total) by mouth daily. 03/02/15   Vassie Loll, MD  citalopram (CELEXA) 20 MG tablet Take 20 mg by mouth daily.     [provider]  clopidogrel (PLAVIX) 75 MG tablet Take 1 tablet (75 mg total) by mouth daily. 10/02/17   Marinus Maw, MD  furosemide (LASIX) 40 MG tablet Take 1 tablet (40 mg total) by mouth daily. 11/26/16   Marinus Maw, MD  levothyroxine (SYNTHROID, LEVOTHROID) 112 MCG tablet Take 112 mcg by mouth daily before breakfast.    [provider]  loratadine (CLARITIN) 10 MG tablet Take 10 mg by mouth daily as needed for allergies.     [provider]  losartan (COZAAR) 25 MG tablet Take 25 mg by mouth at bedtime.  06/13/16   [provider]  metoprolol tartrate (LOPRESSOR) 25 MG tablet Take 0.5 tablets (12.5 mg total) by mouth 2 (two) times daily. Patient taking differently: Take 6.25-12.5 mg by mouth See admin instructions. 6.25 mg in the morning and 12.5 mg at bedtime ONLY IF SYSTOLIC B/P IS 100 OR GREATER AND PULSE RATE IS 60 BPM OR GREATER 06/01/15   Marinus Maw, MD  Multiple Vitamin (MULTIVITAMIN) tablet Take 1 tablet by mouth daily.      [provider]  multivitamin-lutein (OCUVITE-LUTEIN) CAPS capsule Take 1 capsule by mouth daily.    [provider]  nitroGLYCERIN (NITROSTAT) 0.4 MG SL tablet Place 1 tablet (0.4 mg total) under the tongue every 5 (five) minutes as needed for chest pain. 06/01/14   Roxy Horseman, PA-C  oxyCODONE-acetaminophen (PERCOCET) 7.5-325 MG tablet Take 0.5 tablets by mouth at bedtime as needed for pain. 06/12/16   [provider]  potassium chloride (K-DUR) 10 MEQ tablet Take 1 tablet (10 mEq total) by mouth daily. 08/29/16   Marinus Maw, MD  pravastatin (PRAVACHOL) 40 MG tablet Take 40 mg by mouth at bedtime.      [provider]    Family History Family History  Problem Relation Age of Onset  . Tuberculosis Mother   . Coronary artery disease Other   . Stroke Other   . COPD Other     Social History Social History   Tobacco Use  . Smoking status: Never Smoker  . Smokeless tobacco:  Never Used  Substance Use Topics  . Alcohol use: No  . Drug use: No     Allergies   Tape   Review of Systems Review of Systems  All other systems reviewed and are negative.    Physical Exam Updated Vital Signs BP (!) 141/73   Pulse 63   Temp 98.7 F (37.1 C) (Oral)   Resp 16   SpO2 97%   Physical Exam  Constitutional: She appears well-developed and well-nourished.  Elderly female in no acute distress.  HENT:  Head: Normocephalic and atraumatic.  Right Ear: External ear normal.  Left Ear: External ear normal.  Nose: Nose normal.  Mouth/Throat: Uvula is midline, oropharynx is clear and moist and mucous membranes are normal. No tonsillar exudate.  Eyes: Pupils are equal, round, and reactive to light. Right eye exhibits no discharge. Left eye exhibits no discharge. No  scleral icterus.  Neck: Trachea normal. Neck supple. No spinous process tenderness present. No neck rigidity. Normal range of motion present.  No C-spine tenderness palpation or step-offs.  Cardiovascular: Normal rate, regular rhythm and intact distal pulses.  No murmur heard. Pulses:      Radial pulses are 2+ on the right side, and 2+ on the left side.       Dorsalis pedis pulses are 2+ on the right side, and 2+ on the left side.       Posterior tibial pulses are 2+ on the right side, and 2+ on the left side.  No lower extremity swelling or edema. Calves symmetric in size bilaterally.  Pulmonary/Chest: Effort normal and breath sounds normal. She exhibits no tenderness.  Abdominal: Soft. Bowel sounds are normal. There is no tenderness. There is no rebound and no guarding.  Musculoskeletal: She exhibits no edema.  Mild tenderness palpation of the lumbar spine as well as bilateral hips.  No leg shortening or external rotation.  Compartments soft.  She is neurovascular intact.  Lymphadenopathy:    She has no cervical adenopathy.  Neurological: She is alert.  Skin: Skin is warm and dry. No rash noted. She is  not diaphoretic.  Psychiatric: She has a normal mood and affect.  Nursing note and vitals reviewed.    ED Treatments / Results  Labs (all labs ordered are listed, but only abnormal results are displayed) Labs Reviewed - No data to display  EKG None  Radiology Ct Lumbar Spine Wo Contrast  Result Date: 10/08/2017 CLINICAL DATA:  Right hip pain after a fall 2 days ago. Initial encounter. EXAM: CT LUMBAR SPINE WITHOUT CONTRAST TECHNIQUE: Multidetector CT imaging of the lumbar spine was performed without intravenous contrast administration. Multiplanar CT image reconstructions were also generated. COMPARISON:  Chest radiograph 08/26/2016 FINDINGS: Segmentation: 5 lumbar type vertebrae. Alignment: Degenerative anterolisthesis of L4 on L5 and L5 on S1 measuring 3 mm. Vertebrae: L2 superior endplate Schmorl's node. L3 vertebral body compression fracture with 15% height loss centrally, not definitely new from the prior chest radiographs. L4 superior endplate compression fracture with 25-30% height loss, also not definitely new from the prior radiographs though more prominent in appearance. No retropulsion at either level. No posterior element fracture. No destructive osseous process. Paraspinal and other soft tissues: No significant paravertebral hematoma at either the L3 or L4 levels. Partially visualized small right pleural effusion and bibasilar atelectasis. Possible small sliding hiatal hernia. Extensive abdominal aortic atherosclerosis without aneurysm. Disc levels: Advanced disc degeneration at T11-12 and T12-L1 with severe disc space narrowing and vacuum disc. Moderate left neural foraminal stenosis at T12-L1. L1-2: Mild disc space narrowing and vacuum disc. Disc bulging and mild facet arthrosis result in at most mild bilateral neural foraminal stenosis without spinal stenosis. L2-3: Mild disc bulging and moderate facet arthrosis result in borderline spinal stenosis without neural foraminal stenosis.  L3-4: Disc bulging and severe facet and ligamentum flavum hypertrophy result in moderate to severe spinal stenosis and moderate bilateral neural foraminal stenosis. L4-5: Vacuum disc. Anterolisthesis with bulging uncovered disc and severe facet hypertrophy result in moderate to severe spinal stenosis and moderate bilateral neural foraminal stenosis. L5-S1: Mild disc space narrowing and vacuum disc. Anterolisthesis with bulging uncovered disc, ligamentum flavum thickening, and severe facet arthrosis result in moderate to severe right and mild left neural foraminal stenosis and mild spinal stenosis. IMPRESSION: 1. Mild L3 and L4 compression fractures, potentially chronic. If the patient has pain directly referable to this region and  vertebral augmentation would be considered for treatment of an acute fracture, consider nuclear medicine bone scan for further evaluation. 2. Lumbar disc and facet degeneration with moderate to severe spinal stenosis and moderate bilateral neural foraminal stenosis at L3-4 and L4-5. 3.  Aortic Atherosclerosis (ICD10-I70.0). Electronically Signed   By: Sebastian AcheAllen  Grady M.D.   On: 10/08/2017 15:53   Dg Hip Unilat With Pelvis 2-3 Views Left  Result Date: 10/08/2017 CLINICAL DATA:  BILATERAL hip pain after falling. EXAM: DG HIP (WITH OR WITHOUT PELVIS) 2-3V LEFT; DG HIP (WITH OR WITHOUT PELVIS) 2-3V RIGHT COMPARISON:  None. FINDINGS: Patient is status post BILATERAL total hip arthroplasty. There is no dislocation or periprosthetic fracture/loosening. No pelvic fractures are seen. There is vascular calcification. Lumbar degenerative disc disease. IMPRESSION: Unremarkable appearing RIGHT and LEFT total hip arthroplasty without dislocation, periprosthetic fracture, or loosening Electronically Signed   By: Elsie StainJohn T Curnes M.D.   On: 10/08/2017 15:05   Dg Hip Unilat With Pelvis 2-3 Views Right  Result Date: 10/08/2017 CLINICAL DATA:  BILATERAL hip pain after falling. EXAM: DG HIP (WITH OR WITHOUT  PELVIS) 2-3V LEFT; DG HIP (WITH OR WITHOUT PELVIS) 2-3V RIGHT COMPARISON:  None. FINDINGS: Patient is status post BILATERAL total hip arthroplasty. There is no dislocation or periprosthetic fracture/loosening. No pelvic fractures are seen. There is vascular calcification. Lumbar degenerative disc disease. IMPRESSION: Unremarkable appearing RIGHT and LEFT total hip arthroplasty without dislocation, periprosthetic fracture, or loosening Electronically Signed   By: Elsie StainJohn T Curnes M.D.   On: 10/08/2017 15:05    Procedures Procedures (including critical care time)  Medications Ordered in ED Medications  acetaminophen (TYLENOL) tablet 650 mg (has no administration in time range)     Initial Impression / Assessment and Plan / ED Course  I have reviewed the triage vital signs and the nursing notes.  Pertinent labs & imaging results that were available during my care of the patient were reviewed by me and considered in my medical decision making (see chart for details).     84 y.o. female that currently lives at home with family with a mechanical fall Monday. No head trauma or LOC. Patient with b/l hip pain and lumbar spine. Exam reassuring as above.   xrays of b/l hips reassuring. Ct scan shows L3 and L4 compression fractures. Family does not feel they can care for patient at home or control her pain. They have been having to increase the patient's pain medication doses at home. Feel the patient will need to be admitted for pain control as well as further management.   Will obtain pre-admission labs. With admission pending, case signed out to Wells FargoKelly Gekas, PA-C. Family in agreement with plan.   Patient case seen and discussed with Dr. Anitra LauthPlunkett who is in agreement with plan.   Final Clinical Impressions(s) / ED Diagnoses   Final diagnoses:  Closed compression fracture of L3 lumbar vertebra, initial encounter (HCC)  Closed compression fracture of L4 lumbar vertebra, initial encounter [facility](HCC)     ED Discharge Orders    None       Princella PellegriniMaczis, Torra Pala M, PA-C 10/08/17 1615    Gwyneth SproutPlunkett, Whitney, MD 10/08/17 2131

## 2017-10-08 NOTE — ED Provider Notes (Signed)
Pt signed out to me by Leary RocaMichael Maczis PA-C. 84 year old female presents with intractable back pain after a mechanical fall several days ago. Daughter states she is her caregiver and she typically walks and is pretty independent with ADLs. Plan is to admit for pain control/PT, etc. Labs are pending.  CBC is remarkable for mild leukocytosis. BMP is remarkable for mild hypokalemia (3.1). While waiting for labs, her BP was markedly elevated with SBP persistently >200. IV Hydralazine ordered.  BP is still elevated. Another round of Hydralazine ordered which did improve her BP. Discussed with Dr. Adela Glimpseoutova who will admit.   Bethel BornGekas, Lillyrose Reitan Marie, PA-C 10/08/17 2243    Tegeler, Canary Brimhristopher J, MD 10/09/17 1048

## 2017-10-08 NOTE — ED Notes (Signed)
Pt has been gowned, pure wick placed and is resting at this time.

## 2017-10-08 NOTE — ED Triage Notes (Signed)
[facility] after family called stating that the patient is having right sided hip pain after falling on Monday. Fall was witnessed by daughter. Patient is on Plavix and has hx of dementia. No obvious injuries noted.

## 2017-10-08 NOTE — ED Notes (Signed)
Patient transported to X-ray 

## 2017-10-08 NOTE — H&P (Signed)
[name] ZOX:[id number] DOB: 1918/06/24 DOA: 10/08/2017     PCP: Wilmer Floor., MD   Outpatient Specialists:  CARDS: Dr. [name]   Patient arrived to ER on 10/08/17 at 1255  Patient coming from:   home Lives alone,        Chief Complaint:  Chief Complaint  Patient presents with  . Fall    HPI: [name] is a 84 y.o. female with medical history significant of dementia,  Sinus node dysfunction status post pacemaker, HTN, hypothyroidism history of CVA, combined systolic diastolic CHF, iron deficiency anemia  Presented with   Mechanical fall 2 days ago, unable to walk to due pain in her back. Family was helping her to get on the bathroom. She has been having significant right-sided hip pain after the fall that occurred on Monday daughter who have seen her fall.  No head injury but patient is on Plavix Fall occurred when family was helping her move.  Since the fall patient unable to walk around impossible to get her out of the bed even to use the bathroom no associated shortness of breath cough or fever no vomiting no chest pain daughter has attempted to give additional oxycodone but it did not seem to help  She has history of combined CHF treated medically history of CVA on Plavix While in ER:  Noted to have compression fracture lumbar spine  Shelly hypertensive in the emergency department blood pressures above 200 given IV hydralazine with improvement Following Medications were ordered in ER: Medications  acetaminophen (TYLENOL) tablet 650 mg (0 mg Oral Hold 10/08/17 1618)  potassium chloride 10 mEq in 100 mL IVPB (has no administration in time range)  ondansetron (ZOFRAN) injection 4 mg (has no administration in time range)  fentaNYL (SUBLIMAZE) injection 50 mcg (has no administration in time range)  HYDROcodone-acetaminophen (NORCO/VICODIN) 5-325 MG per tablet 1 tablet (1 tablet Oral Given 10/08/17 1624)  hydrALAZINE (APRESOLINE) injection 5 mg (5 mg  Intravenous Given 10/08/17 1855)  hydrALAZINE (APRESOLINE) injection 10 mg (10 mg Intravenous Given 10/08/17 1925)    Significant initial  Findings: Abnormal Labs Reviewed  CBC WITH DIFFERENTIAL/PLATELET - Abnormal; Notable for the following components:      Result Value   WBC 12.1 (*)    Neutro Abs 9.9 (*)    Monocytes Absolute 1.1 (*)    All other components within normal limits  BASIC METABOLIC PANEL - Abnormal; Notable for the following components:   Potassium 3.1 (*)    Calcium 7.1 (*)    All other components within normal limits     Na 142 K 3.1  Cr    Stable,  Lab Results  Component Value Date   CREATININE 0.69 10/08/2017   CREATININE 0.82 08/28/2016   CREATININE 0.76 08/27/2016      WBC  12.1  HG/HCT  stable,       Component Value Date/Time   HGB 12.9 10/08/2017 1629   HCT 40.4 10/08/2017 1629     Lactic Acid, Venous    Component Value Date/Time   LATICACIDVEN 1.54 08/26/2016 1213      UA ordered   CT HEAD  NON acute    CT spine L3-L4 mild compression fractures possibly chronic   Hip imaging unremarkable ECG:  Not ordred      ED Triage Vitals [10/08/17 1328]  Enc Vitals Group     BP (!) 141/73     Pulse Rate 63     Resp 16  Temp 98.7 F (37.1 C)     Temp Source Oral     SpO2 (!) 89 %     Weight      Height      Head Circumference      Peak Flow      Pain Score      Pain Loc      Pain Edu?      Excl. in GC?   ZOXW(96)@       Latest  Blood pressure (!) 147/71, pulse 79, temperature 98.7 F (37.1 C), temperature source Oral, resp. rate (!) 22, SpO2 97 %.     Hospitalist was called for admission for back pain with compression fracture secondary to fall   Review of Systems:    Pertinent positives include: fatigue,  Back pain Constitutional:  No weight loss, night sweats, Fevers, chills, weight loss  HEENT:  No headaches, Difficulty swallowing,Tooth/dental problems,Sore throat,  No sneezing, itching, ear ache, nasal  congestion, post nasal drip,  Cardio-vascular:  No chest pain, Orthopnea, PND, anasarca, dizziness, palpitations.no Bilateral lower extremity swelling  GI:  No heartburn, indigestion, abdominal pain, nausea, vomiting, diarrhea, change in bowel habits, loss of appetite, melena, blood in stool, hematemesis Resp:  no shortness of breath at rest. No dyspnea on exertion, No excess mucus, no productive cough, No non-productive cough, No coughing up of blood.No change in color of mucus.No wheezing. Skin:  no rash or lesions. No jaundice GU:  no dysuria, change in color of urine, no urgency or frequency. No straining to urinate.  No flank pain.  Musculoskeletal:  No joint pain or no joint swelling. No decreased range of motion.   Psych:  No change in mood or affect. No depression or anxiety. No memory loss.  Neuro: no localizing neurological complaints, no tingling, no weakness, no double vision, no gait abnormality, no slurred speech, no confusion  As per HPI otherwise 10 point review of systems negative.   Past Medical History:   Past Medical History:  Diagnosis Date  . Anxiety   . Chronic combined systolic and diastolic CHF (congestive heart failure) (HCC)   . Complication of anesthesia    ANXIETY  . Confusion Adm - 04/27/2013  . Dementia    "early" (02/28/2015)  . Depression   . History of stroke    a. Per chart.  Marland Kitchen HTN (hypertension)   . Hypothyroidism   . Iron deficiency anemia, unspecified   . LV dysfunction    a. 2D Echo 03/01/15: poor image quality, septal HK, ? inferior wall as well, mild LVH, mild focal basal hypertrophy of septum, EF 45-50%, mildly dilated LA, PASP .  . Multiple falls    "over the last 6 months she's had 4" (02/28/2015)  . NSTEMI (non-ST elevated myocardial infarction) (HCC) 02/28/2015   a. Dx 02/2015 - treated medically.  . Osteoarthritis    "everywhere"  . Overactive bladder   . Pure hypercholesterolemia   . Symptomatic bradycardia    a. Near  syncope s/p Medtronic pacemaker 2008. b. s/p gen change in 08/2016.  Marland Kitchen TIA (transient ischemic attack) "many of them"      Past Surgical History:  Procedure Laterality Date  . ABDOMINAL HYSTERECTOMY    . CATARACT EXTRACTION, BILATERAL Bilateral 1960's  . INSERT / REPLACE / REMOVE PACEMAKER    . PACEMAKER INSERTION  01/05/07   Medtronic, dual chamber. Dr. Lewayne Bunting MD.  . PARTIAL HIP ARTHROPLASTY Bilateral   . PPM GENERATOR CHANGEOUT N/A 08/27/2016  Procedure: PPM Generator Changeout;  Surgeon: Hillis Range, MD;  Location: [facility];  Service: Cardiovascular;  Laterality: N/A;  . TOTAL KNEE ARTHROPLASTY Bilateral     Social History:  Ambulatory   walker       reports that she has never smoked. She has never used smokeless tobacco. She reports that she does not drink alcohol or use drugs.     Family History:   Family History  Problem Relation Age of Onset  . Tuberculosis Mother   . Coronary artery disease Other   . Stroke Other   . COPD Other     Allergies: Allergies  Allergen Reactions  . Tape Other (See Comments)    PATIENT'S SKIN IS VERY THIN AND WILL BRUISE AND TEAR EASILY!!     Prior to Admission medications   Medication Sig Start Date End Date Taking? Authorizing Provider  aspirin 81 MG chewable tablet Chew 1 tablet (81 mg total) by mouth daily. 03/02/15  Yes Vassie Loll, MD  CALCIUM PO Take by mouth.   Yes [provider]  Cholecalciferol (VITAMIN D PO) Take by mouth.   Yes [provider]  citalopram (CELEXA) 20 MG tablet Take 20 mg by mouth daily.    Yes [provider]  clopidogrel (PLAVIX) 75 MG tablet Take 1 tablet (75 mg total) by mouth daily. 10/02/17  Yes Marinus Maw, MD  levothyroxine (SYNTHROID, LEVOTHROID) 112 MCG tablet Take 112 mcg by mouth daily before breakfast.   Yes [provider]  losartan (COZAAR) 25 MG tablet Take 25 mg by mouth at bedtime.  06/13/16  Yes [provider]    metoprolol tartrate (LOPRESSOR) 25 MG tablet Take 0.5 tablets (12.5 mg total) by mouth 2 (two) times daily. Patient taking differently: Take 6.25-12.5 mg by mouth See admin instructions. 6.25 mg in the morning and 12.5 mg at bedtime ONLY IF SYSTOLIC B/P IS 100 OR GREATER AND PULSE RATE IS 60 BPM OR GREATER 06/01/15  Yes Marinus Maw, MD  Multiple Vitamin (MULTIVITAMIN) tablet Take 1 tablet by mouth daily.     Yes [provider]  multivitamin-lutein (OCUVITE-LUTEIN) CAPS capsule Take 1 capsule by mouth daily.   Yes [provider]  oxyCODONE-acetaminophen (PERCOCET) 7.5-325 MG tablet Take 0.5 tablets by mouth at bedtime as needed for pain. 06/12/16  Yes [provider]  pravastatin (PRAVACHOL) 40 MG tablet Take 40 mg by mouth at bedtime.     Yes [provider]  nitroGLYCERIN (NITROSTAT) 0.4 MG SL tablet Place 1 tablet (0.4 mg total) under the tongue every 5 (five) minutes as needed for chest pain. 06/01/14   Roxy Horseman, PA-C  potassium chloride (K-DUR) 10 MEQ tablet Take 1 tablet (10 mEq total) by mouth daily. Patient not taking: Reported on 10/08/2017 08/29/16   Marinus Maw, MD   Physical Exam: Blood pressure (!) 147/71, pulse 79, temperature 98.7 F (37.1 C), temperature source Oral, resp. rate (!) 22, SpO2 97 %. 1. General:  in No Acute distress * Chronically ill -appearing 2. Psychological: Alert but not Oriented 3. Head/ENT:    Dry Mucous Membranes                          Head Non traumatic, neck supple                            Poor Dentition 4. SKIN:  decreased Skin  turgor,  Skin clean Dry and intact no rash 5. Heart: Regular rate and rhythm no  Murmur, no Rub or gallop 6. Lungs:  no wheezes or crackles   7. Abdomen: Soft,  non-tender, Non distended  obese  bowel sounds present 8. Lower extremities: no clubbing, cyanosis, or edema 9. Neurologically Grossly intact, moving all 4 extremities equally  10. MSK: Normal range of  motion   LABS:     Recent Labs  Lab 10/08/17 1629  WBC 12.1*  NEUTROABS 9.9*  HGB 12.9  HCT 40.4  MCV 96.4  PLT 298   Basic Metabolic Panel: Recent Labs  Lab 10/08/17 1827  NA 142  K 3.1*  CL 110  CO2 24  GLUCOSE 91  BUN 19  CREATININE 0.69  CALCIUM 7.1*      No results for input(s): AST, ALT, ALKPHOS, BILITOT, PROT, ALBUMIN in the last 168 hours. No results for input(s): LIPASE, AMYLASE in the last 168 hours. No results for input(s): AMMONIA in the last 168 hours.    HbA1C: No results for input(s): HGBA1C in the last 72 hours. CBG: No results for input(s): GLUCAP in the last 168 hours.    Urine analysis:    Component Value Date/Time   COLORURINE YELLOW 02/28/2015 1044   APPEARANCEUR CLEAR 02/28/2015 1044   LABSPEC 1.012 02/28/2015 1044   PHURINE 6.0 02/28/2015 1044   GLUCOSEU NEGATIVE 02/28/2015 1044   HGBUR SMALL (A) 02/28/2015 1044   BILIRUBINUR NEGATIVE 02/28/2015 1044   KETONESUR 15 (A) 02/28/2015 1044   PROTEINUR NEGATIVE 02/28/2015 1044   UROBILINOGEN 1.0 02/28/2015 1044   NITRITE NEGATIVE 02/28/2015 1044   LEUKOCYTESUR TRACE (A) 02/28/2015 1044       Cultures:    Component Value Date/Time   SDES URINE, RANDOM 02/28/2015 1036   SPECREQUEST NONE 02/28/2015 1036   CULT MULTIPLE SPECIES PRESENT, SUGGEST RECOLLECTION 02/28/2015 1036   REPTSTATUS 03/02/2015 FINAL 02/28/2015 1036     Radiological Exams on Admission: Ct Head Wo Contrast  Result Date: 10/08/2017 CLINICAL DATA:  Fall, on Plavix EXAM: CT HEAD WITHOUT CONTRAST TECHNIQUE: Contiguous axial images were obtained from the base of the skull through the vertex without intravenous contrast. COMPARISON:  04/27/2013 FINDINGS: Brain: There is atrophy and chronic small vessel disease changes. No acute intracranial abnormality. Specifically, no hemorrhage, hydrocephalus, mass lesion, acute infarction, or significant intracranial injury. Vascular: No hyperdense vessel or unexpected  calcification. Skull: No acute calvarial abnormality. Sinuses/Orbits: Visualized paranasal sinuses and mastoids clear. Orbital soft tissues unremarkable. Other: None IMPRESSION: No acute intracranial abnormality. Atrophy, chronic microvascular disease. Electronically Signed   By: Charlett Nose M.D.   On: 10/08/2017 20:57   Ct Lumbar Spine Wo Contrast  Result Date: 10/08/2017 CLINICAL DATA:  Right hip pain after a fall 2 days ago. Initial encounter. EXAM: CT LUMBAR SPINE WITHOUT CONTRAST TECHNIQUE: Multidetector CT imaging of the lumbar spine was performed without intravenous contrast administration. Multiplanar CT image reconstructions were also generated. COMPARISON:  Chest radiograph 08/26/2016 FINDINGS: Segmentation: 5 lumbar type vertebrae. Alignment: Degenerative anterolisthesis of L4 on L5 and L5 on S1 measuring 3 mm. Vertebrae: L2 superior endplate Schmorl's node. L3 vertebral body compression fracture with 15% height loss centrally, not definitely new from the prior chest radiographs. L4 superior endplate compression fracture with 25-30% height loss, also not definitely new from the prior radiographs though more prominent in appearance. No retropulsion at either level. No posterior element fracture. No destructive osseous process. Paraspinal and other soft tissues: No significant paravertebral hematoma at  either the L3 or L4 levels. Partially visualized small right pleural effusion and bibasilar atelectasis. Possible small sliding hiatal hernia. Extensive abdominal aortic atherosclerosis without aneurysm. Disc levels: Advanced disc degeneration at T11-12 and T12-L1 with severe disc space narrowing and vacuum disc. Moderate left neural foraminal stenosis at T12-L1. L1-2: Mild disc space narrowing and vacuum disc. Disc bulging and mild facet arthrosis result in at most mild bilateral neural foraminal stenosis without spinal stenosis. L2-3: Mild disc bulging and moderate facet arthrosis result in borderline  spinal stenosis without neural foraminal stenosis. L3-4: Disc bulging and severe facet and ligamentum flavum hypertrophy result in moderate to severe spinal stenosis and moderate bilateral neural foraminal stenosis. L4-5: Vacuum disc. Anterolisthesis with bulging uncovered disc and severe facet hypertrophy result in moderate to severe spinal stenosis and moderate bilateral neural foraminal stenosis. L5-S1: Mild disc space narrowing and vacuum disc. Anterolisthesis with bulging uncovered disc, ligamentum flavum thickening, and severe facet arthrosis result in moderate to severe right and mild left neural foraminal stenosis and mild spinal stenosis. IMPRESSION: 1. Mild L3 and L4 compression fractures, potentially chronic. If the patient has pain directly referable to this region and vertebral augmentation would be considered for treatment of an acute fracture, consider nuclear medicine bone scan for further evaluation. 2. Lumbar disc and facet degeneration with moderate to severe spinal stenosis and moderate bilateral neural foraminal stenosis at L3-4 and L4-5. 3.  Aortic Atherosclerosis (ICD10-I70.0). Electronically Signed   By: Sebastian Ache M.D.   On: 10/08/2017 15:53   Dg Hip Unilat With Pelvis 2-3 Views Left  Result Date: 10/08/2017 CLINICAL DATA:  BILATERAL hip pain after falling. EXAM: DG HIP (WITH OR WITHOUT PELVIS) 2-3V LEFT; DG HIP (WITH OR WITHOUT PELVIS) 2-3V RIGHT COMPARISON:  None. FINDINGS: Patient is status post BILATERAL total hip arthroplasty. There is no dislocation or periprosthetic fracture/loosening. No pelvic fractures are seen. There is vascular calcification. Lumbar degenerative disc disease. IMPRESSION: Unremarkable appearing RIGHT and LEFT total hip arthroplasty without dislocation, periprosthetic fracture, or loosening Electronically Signed   By: Elsie Stain M.D.   On: 10/08/2017 15:05   Dg Hip Unilat With Pelvis 2-3 Views Right  Result Date: 10/08/2017 CLINICAL DATA:  BILATERAL  hip pain after falling. EXAM: DG HIP (WITH OR WITHOUT PELVIS) 2-3V LEFT; DG HIP (WITH OR WITHOUT PELVIS) 2-3V RIGHT COMPARISON:  None. FINDINGS: Patient is status post BILATERAL total hip arthroplasty. There is no dislocation or periprosthetic fracture/loosening. No pelvic fractures are seen. There is vascular calcification. Lumbar degenerative disc disease. IMPRESSION: Unremarkable appearing RIGHT and LEFT total hip arthroplasty without dislocation, periprosthetic fracture, or loosening Electronically Signed   By: Elsie Stain M.D.   On: 10/08/2017 15:05    Chart has been reviewed    Assessment/Plan   85 y.o. female with medical history significant of dementia,  Sinus node dysfunction status post pacemaker, HTN, hypothyroidism history of CVA, combined systolic diastolic CHF, iron deficiency anemia  Admitted for back pain likely secondary to compression fractures  Present on Admission: . Compression fracture of L3 lumbar vertebra, closed, initial encounter (HCC) - IR consulted f for possible intervention given persistent back pain.  Hold Plavix pain management PT OT evaluation . Essential hypertension stable continue home medications . Chronic diastolic CHF (congestive heart failure) (HCC) currently somewhat on the dry side will continue to monitor avoid overhydration . Dementia chronic expect some degree of sundowning while hospitalized . HYPERCHOLESTEROLEMIA stable continue home medications . Hypokalemia - - will replace and repeat in AM,  check  magnesium level and replace as needed  . Hypothyroidism stable continue home medication . PACEMAKER, PERMANENT stable monitor on telemetry while hypokalemic    Other plan as per orders.  DVT prophylaxis:  SCD   Code Status:   DNR/DNI   as per  family  I had personally discussed CODE STATUS with patient and family  Family Communication:   Family   at  Bedside  plan of care was discussed with  Daughter, Wife, Husband, Sister, Brother ,  father, mother  Disposition Plan:    likely will need placement for rehabilitation                                                Would benefit from PT/OT eval prior to DC  ordered                                               Consults called: IR  Admission status:   inpatient      Level of care    tele            Therisa Doynenastassia Deeanna Beightol 10/09/2017, 12:45 AM    [facility]  Pager [phone]   after 2 AM please page floor coverage PA If 7AM-7PM, please contact the day team taking care of the patient  [email or web address]  Password [email or web address]

## 2017-10-08 NOTE — ED Notes (Signed)
ED TO INPATIENT HANDOFF REPORT  Name/Age/Gender [name] 84 y.o. female  Code Status Code Status History    Date Active Date Inactive Code Status Order ID Comments User Context   08/26/2016 2021 08/28/2016 2133 Full Code [id number]  Pixie Casino, MD Inpatient   02/28/2015 1638 03/02/2015 1859 Full Code [id number]  Eugenie Filler, MD Inpatient   04/27/2013 1558 04/28/2013 2004 Full Code [id number]  Kinnie Feil, MD Inpatient    Advance Directive Documentation     Most Recent Value  Type of Advance Directive  Healthcare Power of [place], Living will  Pre-existing out of facility DNR order (yellow form or pink MOST form)  -  "MOST" Form in Place?  -      Home/SNF/Other Home  Chief Complaint Fall; Hip pain   Level of Care/Admitting Diagnosis ED Disposition    ED Disposition Condition [place]: [facility] [[id number]]  Level of Care: Telemetry [5]  Admit to tele based on following criteria: Other see comments  Comments: hypokalemia  Diagnosis: Compression fracture of L3 lumbar vertebra, closed, initial encounter [facility]) [[id number]]  Admitting Physician: Toy Baker [[id number]]  Attending Physician: Toy Baker [[id number]]  Estimated length of stay: 3 - 4 days  Certification:: I certify this patient will need inpatient services for at least 2 midnights  PT Class (Do Not Modify): Inpatient [101]  PT Acc Code (Do Not Modify): Private [1]       Medical History Past Medical History:  Diagnosis Date  . Anxiety   . Chronic combined systolic and diastolic CHF (congestive heart failure) ([place])   . Complication of anesthesia    ANXIETY  . Confusion Adm - 04/27/2013  . Dementia    "early" (02/28/2015)  . Depression   . History of stroke    a. Per chart.  Marland Kitchen HTN (hypertension)   . Hypothyroidism   . Iron deficiency anemia, unspecified   . LV dysfunction    a. 2D Echo 03/01/15: poor image quality, septal HK, ? inferior  wall as well, mild LVH, mild focal basal hypertrophy of septum, EF 45-50%, mildly dilated LA, PASP 36mHg.  . Multiple falls    "over the last 6 months she's had 4" (02/28/2015)  . NSTEMI (non-ST elevated myocardial infarction) [place] 02/28/[place]   a. Dx 02/2015 - treated medically.  . Osteoarthritis    "everywhere"  . Overactive bladder   . Pure hypercholesterolemia   . Symptomatic bradycardia    a. Near syncope s/p Medtronic pacemaker 2008. b. s/p gen change in 08/2016.  .Marland KitchenTIA (transient ischemic attack) "many of them"    Allergies Allergies  Allergen Reactions  . Tape Other (See Comments)    PATIENT'S SKIN IS VERY THIN AND WILL BRUISE AND TEAR EASILY!!    IV Location/Drains/Wounds Patient Lines/Drains/Airways Status   Active Line/Drains/Airways    Name:   Placement date:   Placement time:   Site:   Days:   Peripheral IV 10/08/17 Right Antecubital   10/08/17    1632    Antecubital   less than 1   Incision (Closed) 08/27/16 Chest Left   08/27/16    1605     407          Labs/Imaging Results for orders placed or performed during the hospital encounter of 10/08/17 (from the past 48 hour(s))  CBC with Differential     Status: Abnormal   Collection Time: 10/08/17  4:29 PM  Result Value Ref  Range   WBC 12.1 (H) 4.0 - 10.5 K/uL   RBC 4.19 3.87 - 5.11 MIL/uL   Hemoglobin 12.9 12.0 - 15.0 g/dL   HCT 40.4 36.0 - 46.0 %   MCV 96.4 78.0 - 100.0 fL   MCH 30.8 26.0 - 34.0 pg   MCHC 31.9 30.0 - 36.0 g/dL   RDW 13.9 11.5 - 15.5 %   Platelets 298 150 - 400 K/uL   Neutrophils Relative % 82 %   Neutro Abs 9.9 (H) 1.7 - 7.7 K/uL   Lymphocytes Relative 9 %   Lymphs Abs 1.1 0.7 - 4.0 K/uL   Monocytes Relative 9 %   Monocytes Absolute 1.1 (H) 0.1 - 1.0 K/uL   Eosinophils Relative 0 %   Eosinophils Absolute 0.0 0.0 - 0.7 K/uL   Basophils Relative 0 %   Basophils Absolute 0.0 0.0 - 0.1 K/uL    Comment: Performed at [facility], [place] [address] [place], [place] [address]   Basic metabolic panel     Status: Abnormal   Collection Time: 10/08/17  6:27 PM  Result Value Ref Range   Sodium 142 135 - 145 mmol/L   Potassium 3.1 (L) 3.5 - 5.1 mmol/L   Chloride 110 101 - 111 mmol/L   CO2 24 22 - 32 mmol/L   Glucose, Bld 91 65 - 99 mg/dL   BUN 19 6 - 20 mg/dL   Creatinine, Ser 0.69 0.44 - 1.00 mg/dL   Calcium 7.1 (L) 8.9 - 10.3 mg/dL   GFR calc non Af Amer >60 >60 mL/min   GFR calc Af Amer >60 >60 mL/min    Comment: (NOTE) The eGFR has been calculated using the CKD EPI equation. This calculation has not been validated in all clinical situations. eGFR's persistently <60 mL/min signify possible Chronic Kidney Disease.    Anion gap 8 5 - 15    Comment: Performed at [facility], [place] [address] [place], [place] [address]  Magnesium     Status: Abnormal   Collection Time: 10/08/17  6:27 PM  Result Value Ref Range   Magnesium 1.2 (L) 1.7 - 2.4 mg/dL    Comment: Performed at [facility], [place] [address], [place], [place] [address]   Ct Head Wo Contrast  Result Date: 10/08/2017 CLINICAL DATA:  Fall, on Plavix EXAM: CT HEAD WITHOUT CONTRAST TECHNIQUE: Contiguous axial images were obtained from the base of the skull through the vertex without intravenous contrast. COMPARISON:  04/27/2013 FINDINGS: Brain: There is atrophy and chronic small vessel disease changes. No acute intracranial abnormality. Specifically, no hemorrhage, hydrocephalus, mass lesion, acute infarction, or significant intracranial injury. Vascular: No hyperdense vessel or unexpected calcification. Skull: No acute calvarial abnormality. Sinuses/Orbits: Visualized paranasal sinuses and mastoids clear. Orbital soft tissues unremarkable. Other: None IMPRESSION: No acute intracranial abnormality. Atrophy, chronic microvascular disease. Electronically Signed   By: Rolm Baptise M.D.   On: 10/08/2017 20:57   Ct Lumbar Spine Wo Contrast  Result Date: 10/08/2017 CLINICAL  DATA:  Right hip pain after a fall 2 days ago. Initial encounter. EXAM: CT LUMBAR SPINE WITHOUT CONTRAST TECHNIQUE: Multidetector CT imaging of the lumbar spine was performed without intravenous contrast administration. Multiplanar CT image reconstructions were also generated. COMPARISON:  Chest radiograph 08/26/2016 FINDINGS: Segmentation: 5 lumbar type vertebrae. Alignment: Degenerative anterolisthesis of L4 on L5 and L5 on S1 measuring 3 mm. Vertebrae: L2 superior endplate Schmorl's node. L3 vertebral body compression fracture with 15% height loss centrally, not definitely  new from the prior chest radiographs. L4 superior endplate compression fracture with 25-30% height loss, also not definitely new from the prior radiographs though more prominent in appearance. No retropulsion at either level. No posterior element fracture. No destructive osseous process. Paraspinal and other soft tissues: No significant paravertebral hematoma at either the L3 or L4 levels. Partially visualized small right pleural effusion and bibasilar atelectasis. Possible small sliding hiatal hernia. Extensive abdominal aortic atherosclerosis without aneurysm. Disc levels: Advanced disc degeneration at T11-12 and T12-L1 with severe disc space narrowing and vacuum disc. Moderate left neural foraminal stenosis at T12-L1. L1-2: Mild disc space narrowing and vacuum disc. Disc bulging and mild facet arthrosis result in at most mild bilateral neural foraminal stenosis without spinal stenosis. L2-3: Mild disc bulging and moderate facet arthrosis result in borderline spinal stenosis without neural foraminal stenosis. L3-4: Disc bulging and severe facet and ligamentum flavum hypertrophy result in moderate to severe spinal stenosis and moderate bilateral neural foraminal stenosis. L4-5: Vacuum disc. Anterolisthesis with bulging uncovered disc and severe facet hypertrophy result in moderate to severe spinal stenosis and moderate bilateral neural  foraminal stenosis. L5-S1: Mild disc space narrowing and vacuum disc. Anterolisthesis with bulging uncovered disc, ligamentum flavum thickening, and severe facet arthrosis result in moderate to severe right and mild left neural foraminal stenosis and mild spinal stenosis. IMPRESSION: 1. Mild L3 and L4 compression fractures, potentially chronic. If the patient has pain directly referable to this region and vertebral augmentation would be considered for treatment of an acute fracture, consider nuclear medicine bone scan for further evaluation. 2. Lumbar disc and facet degeneration with moderate to severe spinal stenosis and moderate bilateral neural foraminal stenosis at L3-4 and L4-5. 3.  Aortic Atherosclerosis (ICD10-I70.0). Electronically Signed   By: Logan Bores M.D.   On: 10/08/2017 15:53   Dg Hip Unilat With Pelvis 2-3 Views Left  Result Date: 10/08/2017 CLINICAL DATA:  BILATERAL hip pain after falling. EXAM: DG HIP (WITH OR WITHOUT PELVIS) 2-3V LEFT; DG HIP (WITH OR WITHOUT PELVIS) 2-3V RIGHT COMPARISON:  None. FINDINGS: Patient is status post BILATERAL total hip arthroplasty. There is no dislocation or periprosthetic fracture/loosening. No pelvic fractures are seen. There is vascular calcification. Lumbar degenerative disc disease. IMPRESSION: Unremarkable appearing RIGHT and LEFT total hip arthroplasty without dislocation, periprosthetic fracture, or loosening Electronically Signed   By: Staci Righter M.D.   On: 10/08/2017 15:05   Dg Hip Unilat With Pelvis 2-3 Views Right  Result Date: 10/08/2017 CLINICAL DATA:  BILATERAL hip pain after falling. EXAM: DG HIP (WITH OR WITHOUT PELVIS) 2-3V LEFT; DG HIP (WITH OR WITHOUT PELVIS) 2-3V RIGHT COMPARISON:  None. FINDINGS: Patient is status post BILATERAL total hip arthroplasty. There is no dislocation or periprosthetic fracture/loosening. No pelvic fractures are seen. There is vascular calcification. Lumbar degenerative disc disease. IMPRESSION: Unremarkable  appearing RIGHT and LEFT total hip arthroplasty without dislocation, periprosthetic fracture, or loosening Electronically Signed   By: Staci Righter M.D.   On: 10/08/2017 15:05    Pending Labs Unresulted Labs (From admission, onward)   Start     Ordered   10/08/17 2255  Urinalysis, Routine w reflex microscopic  Once,   R     10/08/17 2254   Signed and Held  Magnesium  Tomorrow morning,   R    Comments:  Call MD if <1.5    Signed and Held   Signed and Held  Phosphorus  Tomorrow morning,   R     Signed and Held  Signed and Held  TSH  Once,   R    Comments:  Cancel if already done within 1 month and notify MD    Signed and Held   Signed and Held  Comprehensive metabolic panel  Once,   R    Comments:  Cal MD for K<3.5 or >5.0    Signed and Held   Signed and Held  CBC  Once,   R    Comments:  Call for hg <8.0    Signed and Held      Vitals/Pain Today's Vitals   10/08/17 2200 10/08/17 2230 10/08/17 2300 10/08/17 2330  BP: (!) 141/71 128/65 (!) 152/68 (!) 141/68  Pulse: 80 73 69 72  Resp: '19 16 16 18  '$ Temp:      TempSrc:      SpO2: 96% 98% 98% 97%    Isolation Precautions No active isolations  Medications Medications  acetaminophen (TYLENOL) tablet 650 mg (0 mg Oral Hold 10/08/17 1618)  HYDROcodone-acetaminophen (NORCO/VICODIN) 5-325 MG per tablet 1 tablet (1 tablet Oral Given 10/08/17 1624)  hydrALAZINE (APRESOLINE) injection 5 mg (5 mg Intravenous Given 10/08/17 1855)  hydrALAZINE (APRESOLINE) injection 10 mg (10 mg Intravenous Given 10/08/17 1925)  potassium chloride 10 mEq in 100 mL IVPB (0 mEq Intravenous Stopped 10/08/17 2328)  ondansetron (ZOFRAN) injection 4 mg (4 mg Intravenous Given 10/08/17 2158)  fentaNYL (SUBLIMAZE) injection 50 mcg (50 mcg Intravenous Given 10/08/17 2157)    Mobility manual wheelchair

## 2017-10-08 NOTE — ED Notes (Addendum)
Patient's O2 sat 88% on RA. Patient placed on 2L nasal cannula O2 by this RN. O2 sat now 97% on 2L O2.

## 2017-10-09 ENCOUNTER — Other Ambulatory Visit: Payer: Self-pay

## 2017-10-09 LAB — URINALYSIS, ROUTINE W REFLEX MICROSCOPIC
Bilirubin Urine: NEGATIVE
Glucose, UA: NEGATIVE mg/dL
KETONES UR: 5 mg/dL — AB
Nitrite: POSITIVE — AB
PROTEIN: 30 mg/dL — AB
Specific Gravity, Urine: 1.019 (ref 1.005–1.030)
pH: 6 (ref 5.0–8.0)

## 2017-10-09 LAB — COMPREHENSIVE METABOLIC PANEL
ALK PHOS: 72 U/L (ref 38–126)
ALT: 12 U/L — ABNORMAL LOW (ref 14–54)
ANION GAP: 8 (ref 5–15)
AST: 18 U/L (ref 15–41)
Albumin: 2.7 g/dL — ABNORMAL LOW (ref 3.5–5.0)
BILIRUBIN TOTAL: 0.7 mg/dL (ref 0.3–1.2)
BUN: 24 mg/dL — ABNORMAL HIGH (ref 6–20)
CALCIUM: 8.5 mg/dL — AB (ref 8.9–10.3)
CO2: 26 mmol/L (ref 22–32)
Chloride: 103 mmol/L (ref 101–111)
Creatinine, Ser: 0.79 mg/dL (ref 0.44–1.00)
GFR calc non Af Amer: >60 mL/min (ref >60)
Glucose, Bld: 104 mg/dL — ABNORMAL HIGH (ref 65–99)
POTASSIUM: 5.6 mmol/L — AB (ref 3.5–5.1)
Sodium: 137 mmol/L (ref 135–145)
TOTAL PROTEIN: 6 g/dL — AB (ref 6.5–8.1)

## 2017-10-09 LAB — CBC
HEMATOCRIT: 37.1 % (ref 36.0–46.0)
HEMOGLOBIN: 11.6 g/dL — AB (ref 12.0–15.0)
MCH: 30.4 pg (ref 26.0–34.0)
MCHC: 31.3 g/dL (ref 30.0–36.0)
MCV: 97.4 fL (ref 78.0–100.0)
Platelets: 251 10*3/uL (ref 150–400)
RBC: 3.81 MIL/uL — AB (ref 3.87–5.11)
RDW: 14.4 % (ref 11.5–15.5)
WBC: 12.5 10*3/uL — ABNORMAL HIGH (ref 4.0–10.5)

## 2017-10-09 LAB — PHOSPHORUS: PHOSPHORUS: 3.4 mg/dL (ref 2.5–4.6)

## 2017-10-09 LAB — TSH: TSH: 1.999 u[IU]/mL (ref 0.350–4.500)

## 2017-10-09 LAB — MAGNESIUM: MAGNESIUM: 1.6 mg/dL — AB (ref 1.7–2.4)

## 2017-10-09 MED ORDER — CITALOPRAM HYDROBROMIDE 20 MG PO TABS
20.0000 mg | ORAL_TABLET | Freq: Every day | ORAL | Status: DC
  Administered 2017-10-09 – 2017-10-14 (×6): 20 mg via ORAL
  Filled 2017-10-09 (×6): qty 1

## 2017-10-09 MED ORDER — ONDANSETRON HCL 4 MG PO TABS: 4.0000 mg | ORAL_TABLET | Freq: Four times a day (QID) | ORAL | Status: DC | PRN

## 2017-10-09 MED ORDER — OXYCODONE-ACETAMINOPHEN 7.5-325 MG PO TABS
1.0000 | ORAL_TABLET | Freq: Four times a day (QID) | ORAL | Status: DC | PRN
  Administered 2017-10-09 – 2017-10-10 (×3): 1 via ORAL
  Filled 2017-10-09 (×3): qty 1

## 2017-10-09 MED ORDER — METHOCARBAMOL 1000 MG/10ML IJ SOLN
500.0000 mg | Freq: Three times a day (TID) | INTRAMUSCULAR | Status: DC
  Filled 2017-10-09 (×2): qty 5

## 2017-10-09 MED ORDER — ACETAMINOPHEN 325 MG PO TABS: 650.0000 mg | ORAL_TABLET | Freq: Four times a day (QID) | ORAL | Status: DC | PRN

## 2017-10-09 MED ORDER — PRAVASTATIN SODIUM 40 MG PO TABS
40.0000 mg | ORAL_TABLET | Freq: Every day | ORAL | Status: DC
  Administered 2017-10-09 – 2017-10-13 (×6): 40 mg via ORAL
  Filled 2017-10-09 (×6): qty 1

## 2017-10-09 MED ORDER — METHOCARBAMOL 1000 MG/10ML IJ SOLN
500.0000 mg | Freq: Three times a day (TID) | INTRAVENOUS | Status: DC
  Administered 2017-10-09 – 2017-10-10 (×4): 500 mg via INTRAVENOUS
  Filled 2017-10-09 (×3): qty 550
  Filled 2017-10-09: qty 5
  Filled 2017-10-09: qty 550

## 2017-10-09 MED ORDER — ACETAMINOPHEN 650 MG RE SUPP: 650.0000 mg | Freq: Four times a day (QID) | RECTAL | Status: DC | PRN

## 2017-10-09 MED ORDER — METOPROLOL TARTRATE 25 MG PO TABS
12.5000 mg | ORAL_TABLET | Freq: Every day | ORAL | Status: DC
  Administered 2017-10-09 – 2017-10-13 (×5): 12.5 mg via ORAL
  Filled 2017-10-09 (×7): qty 1

## 2017-10-09 MED ORDER — POTASSIUM CHLORIDE CRYS ER 20 MEQ PO TBCR
40.0000 meq | EXTENDED_RELEASE_TABLET | Freq: Once | ORAL | Status: AC
  Administered 2017-10-09: 40 meq via ORAL
  Filled 2017-10-09: qty 2

## 2017-10-09 MED ORDER — ONDANSETRON HCL 4 MG/2ML IJ SOLN: 4.0000 mg | Freq: Four times a day (QID) | INTRAMUSCULAR | Status: DC | PRN

## 2017-10-09 MED ORDER — SODIUM CHLORIDE 0.9 % IV SOLN
INTRAVENOUS | Status: DC
  Administered 2017-10-09: 01:00:00 via INTRAVENOUS

## 2017-10-09 MED ORDER — POTASSIUM CHLORIDE 10 MEQ/100ML IV SOLN
10.0000 meq | INTRAVENOUS | Status: AC
  Administered 2017-10-09 (×3): 10 meq via INTRAVENOUS
  Filled 2017-10-09 (×3): qty 100

## 2017-10-09 MED ORDER — METHOCARBAMOL 1000 MG/10ML IJ SOLN: 500.0000 mg | Freq: Three times a day (TID) | INTRAMUSCULAR | Status: DC

## 2017-10-09 MED ORDER — POLYETHYLENE GLYCOL 3350 17 G PO PACK
17.0000 g | PACK | Freq: Every day | ORAL | Status: DC | PRN
  Administered 2017-10-11: 17 g via ORAL
  Filled 2017-10-09: qty 1

## 2017-10-09 MED ORDER — LEVOTHYROXINE SODIUM 112 MCG PO TABS
112.0000 ug | ORAL_TABLET | Freq: Every day | ORAL | Status: DC
  Administered 2017-10-09 – 2017-10-14 (×6): 112 ug via ORAL
  Filled 2017-10-09 (×6): qty 1

## 2017-10-09 MED ORDER — MAGNESIUM SULFATE 2 GM/50ML IV SOLN
2.0000 g | Freq: Once | INTRAVENOUS | Status: AC
  Administered 2017-10-09: 2 g via INTRAVENOUS
  Filled 2017-10-09: qty 50

## 2017-10-09 MED ORDER — METOPROLOL TARTRATE 25 MG PO TABS: 6.2500 mg | ORAL_TABLET | ORAL | Status: DC

## 2017-10-09 MED ORDER — METOPROLOL TARTRATE 25 MG/10 ML ORAL SUSPENSION
6.2500 mg | Freq: Every day | ORAL | Status: DC
  Administered 2017-10-09 – 2017-10-14 (×6): 6.25 mg via ORAL
  Filled 2017-10-09 (×7): qty 5

## 2017-10-09 MED ORDER — LOSARTAN POTASSIUM 25 MG PO TABS
25.0000 mg | ORAL_TABLET | Freq: Every day | ORAL | Status: DC
  Administered 2017-10-09 (×2): 25 mg via ORAL
  Filled 2017-10-09 (×2): qty 1

## 2017-10-09 NOTE — Progress Notes (Signed)
Patient ID: Dana PilonKathleen W Keller, female   DOB: 10/28/1918, 84 y.o.   MRN: [id number] Request received for consideration of possible vertebroplasty/kyphoplasty on patient.  Latest imaging studies have been reviewed by Dr. Archer AsaMcCullough.  He recommends obtaining bone scan to assess for acuity of fractures before determining whether patient is candidate for procedure. Pt's last dose of plavix was yesterday per daughter. If pt is candidate for procedure she would need to be off plavix for at least 5 days beforehand. Will f/u after bone scan obtained.

## 2017-10-09 NOTE — Plan of Care (Signed)
  Problem: Clinical Measurements: Goal: Ability to maintain clinical measurements within normal limits will improve Outcome: Progressing Note:  Potassium level improved.  Goal: Diagnostic test results will improve Outcome: Progressing   Problem: Activity: Goal: Risk for activity intolerance will decrease Outcome: Progressing Note:  PT /OT following pt.    Problem: Elimination: Goal: Will not experience complications related to bowel motility Outcome: Progressing   Problem: Pain Managment: Goal: General experience of comfort will improve Outcome: Progressing   Problem: Safety: Goal: Ability to remain free from injury will improve Outcome: Progressing   Problem: Skin Integrity: Goal: Risk for impaired skin integrity will decrease Outcome: Progressing   Problem: Spiritual Needs Goal: Ability to function at adequate level Outcome: Progressing

## 2017-10-09 NOTE — Progress Notes (Signed)
[facility] Progress Note  Subjective: no new /co, daughter at bedside  Vitals:   10/09/17 0020 10/09/17 0033 10/09/17 0400 10/09/17 0859  BP: (!) 177/72  (!) 147/66 (!) 172/76  Pulse: 74  63 69  Resp: 18   18  Temp: 98.6 F (37 C)  98.5 F (36.9 C)   TempSrc:      SpO2: 99%  93% 93%  Weight:  61.7 kg (136 lb 0.4 oz)    Height:  5\' 2"  (1.575 m)      Inpatient medications: . acetaminophen  650 mg Oral Once  . citalopram  20 mg Oral Daily  . levothyroxine  112 mcg Oral QAC breakfast  . losartan  25 mg Oral QHS  . metoprolol tartrate  6.25 mg Oral Daily   And  . metoprolol tartrate  12.5 mg Oral QHS  . pravastatin  40 mg Oral QHS   . methocarbamol (ROBAXIN)  IV Stopped (10/09/17 0954)   acetaminophen **OR** acetaminophen, ondansetron **OR** ondansetron (ZOFRAN) IV, oxyCODONE-acetaminophen, polyethylene glycol  Exam:  General:  in No Acute distress 1. Chronically ill -appearing, eldelry WF 2. Psychological: Alert but not Oriented 3. Head/ENT:    Dry Mucous Membranes                          Head Non traumatic, neck supple                            Poor Dentition 4. SKIN:  decreased Skin turgor,  Skin clean Dry and intact no rash 5. Heart: Regular rate and rhythm no  Murmur, no Rub or gallop 6. Lungs:  no wheezes or crackles   7. Abdomen: Soft,  non-tender, Non distended  obese  bowel sounds present 8. Lower extremities: no clubbing, cyanosis, or edema 9. Neurologically Grossly intact, moving all 4 extremities equally  10. MSK: Normal range of motion   Brief Summary: 84 yo hx dementia, PPM, HTN, combined CHF, iron def, anemia, CVA had mech fall 2 days prior to presentation, unable to walk due to pain in her back and going down her hips.  Family saw her fall.    IN ED: xrays showed compression fractures of the spine at L3/ L4.  BP's high in ED, over 200, treated IV hydralazine.        Impression/Plan:  1. Fall/ vertebral compression fracture: of L3 lumbar  vertebra - poor candidate for aggressive intervention given advanced age and debility, have d/w daughter who is in agreement - consult palliative care for symptom management, GOC, etc - pt is DNR  2. Essential hypertension: stable  - continue home medications  3. Chronic diastolic CHF:  currently somewhat on the dry side - continue to monitor  - avoid overhydration  4. Dementia - chronic expect some degree of sundowning while hospitalized  5. Hyperlipidemia-  continue home medications  6. Hypokalemia - will replace   7. Hypomagnesemia - will replace    DVT prophylaxis:  SCD  Code Status:   DNR/DNI   as per  family  Communication: Family at Bedside Disposition plan: will likely will need placement for SNF                                         Consults called: none Admission status:   inpatient  Level of care:    tele           Vinson Moselleob Issiah Huffaker MD [facility] pgr [phone] 10/09/2017, 12:27 PM   Recent Labs  Lab 10/08/17 1827 10/09/17 0418  NA 142 137  K 3.1* 5.6*  CL 110 103  CO2 24 26  GLUCOSE 91 104*  BUN 19 24*  CREATININE 0.69 0.79  CALCIUM 7.1* 8.5*  PHOS  --  3.4   Recent Labs  Lab 10/09/17 0418  AST 18  ALT 12*  ALKPHOS 72  BILITOT 0.7  PROT 6.0*  ALBUMIN 2.7*   Recent Labs  Lab 10/08/17 1629 10/09/17 0418  WBC 12.1* 12.5*  NEUTROABS 9.9*  --   HGB 12.9 11.6*  HCT 40.4 37.1  MCV 96.4 97.4  PLT 298 251   Iron/TIBC/Ferritin/ %Sat No results found for: IRON, TIBC, FERRITIN, IRONPCTSAT

## 2017-10-09 NOTE — Progress Notes (Signed)
OT Cancellation Note  Patient Details Name: [name] MRN: [id number] DOB: 02/25/1919   Cancelled Treatment:    Reason Eval/Treat Not Completed: Other (comment)  Spoke to daughter [place].  She wanted me to wait on OT as pt is a lot of pain when she moves.  Palliative consult is pending. Will check back after this.  Marcele Kosta 10/09/2017, 3:20 PM  Marica OtterMaryellen Tempest [place], OTR/L [phone] 10/09/2017

## 2017-10-09 NOTE — Care Management Note (Signed)
Case Management Note  Patient Details  Name: [name] MRN: [id number] Date of Birth: 02/24/1919  Subjective/Objective: 84 y/o f admitted w/Essential HTN. From home. CM referral for HHC needs.PT cons-await recc.                   Action/Plan:d/c plan home w/HHC.   Expected Discharge Date:  (unknown)               Expected Discharge Plan:  Home w Home Health Services  In-House Referral:     Discharge planning Services  CM Consult  Post Acute Care Choice:    Choice offered to:     DME Arranged:    DME Agency:     HH Arranged:    HH Agency:     Status of Service:  In process, will continue to follow  If discussed at Long Length of Stay Meetings, dates discussed:    Additional Comments:  Lanier ClamMahabir, Yeray Tomas, RN 10/09/2017, 10:38 AM

## 2017-10-09 NOTE — Evaluation (Addendum)
Physical Therapy Evaluation Patient Details Name: [name] MRN: [id number] DOB: [birth year] Today's Date: 10/09/2017   History of Present Illness  84 yo female admitted with essential hypertension, L3/L4 comp fx on imaging (possibly chronic), pain after fall at home. Hx of L and R THAs, bradycardia, pacemaker, CHF, NSTEMI, dementia, falls.    Clinical Impression  Limited eval. Pt recently medicated, per family, and she was very drowsy. Able to get pt sitting at EOB for a few minutes. Unable to attempt any further mobility due to drowsiness. O2 sats dropped to 86% on RA during session. Reapplied [place] O2. Daughter was present during session. Discussed d/c plan-daughter stated plan is for pt to return home with family continuing to provide 24 hour care. They are set to have a meeting with palliative care. Will follow and progress activity as tolerated.     Follow Up Recommendations SNF;Home health PT;Supervision/Assistance - 24 hour(depending on palliative care meeting. Family will likely take pt back home per daughter.)    Equipment Recommendations  Hospital bed    Recommendations for Other Services       Precautions / Restrictions Precautions Precautions: Fall Precaution Comments: monitor O2 sats; back pain with activity Restrictions Weight Bearing Restrictions: No      Mobility  Bed Mobility Overal bed mobility: Needs Assistance Bed Mobility: Supine to Sit;Sit to Supine     Supine to sit: HOB elevated;Mod assist Sit to supine: HOB elevated;Max assist   General bed mobility comments: Pt was very drowsy initially. She was able to participate with getting back to bed. Max multimodal cueing required. She was somewhat resistive to returning to supine due to back discomfort. Assist for trunk and LEs required. Utilized bedpad to aid with scooting, positioning. Pt sat EOB ~4-5 mintues with Min guard assist. Eyes remained closed but she did not have a LOB.   Transfers                 General transfer comment: NT-unable to attempt . Pt too drowsy  Ambulation/Gait                Stairs            Wheelchair Mobility    Modified Rankin (Stroke Patients Only)       Balance Overall balance assessment: Needs assistance Sitting-balance support: Feet supported;No upper extremity supported Sitting balance-Leahy Scale: Fair Sitting balance - Comments: Pt sat EOB with Min guard assist. No LOB despite eyes remaining closed and pt being drowsy                                     Pertinent Vitals/Pain Pain Assessment: Faces Faces Pain Scale: Hurts even more Pain Location: likely back (pt would not state location). daughter states pt experiences back pain with movement Pain Descriptors / Indicators: Grimacing Pain Intervention(s): Limited activity within patient's tolerance;Repositioned    Home Living Family/patient expects to be discharged to:: Private residence Living Arrangements: Children(3 children take turns staying with pt) Available Help at Discharge: Family;Available 24 hours/day Type of Home: House       Home Layout: One level Home Equipment: Walker - 2 wheels;Wheelchair - manual;Hospital bed;Shower seat;Bedside commode Additional Comments: hospital bed is not functioning properly    Prior Function Level of Independence: Needs assistance   Gait / Transfers Assistance Needed: nonambulatory since recent fall. family has been assisting pt in/out of wheelchair  ADL's /  Homemaking Assistance Needed: Assist for bathing, dressing        Hand Dominance        Extremity/Trunk Assessment             Cervical / Trunk Assessment Cervical / Trunk Assessment: Kyphotic  Communication   Communication: HOH  Cognition Arousal/Alertness: Lethargic;Suspect due to medications Behavior During Therapy: WFL for tasks assessed/pCharles A Dean Memorial Hospitalerformed Overall Cognitive Status: History of cognitive impairments - at baseline                                         General Comments      Exercises     Assessment/Plan    PT Assessment Patient needs continued PT services  PT Problem List Decreased strength;Decreased balance;Decreased mobility;Decreased cognition;Decreased knowledge of use of DME;Pain;Decreased activity tolerance       PT Treatment Interventions DME instruction;Functional mobility training;Balance training;Patient/family education;Therapeutic activities;Gait training;Therapeutic exercise    PT Goals (Current goals can be found in the Care Plan section)  Acute Rehab PT Goals Patient Stated Goal: per family, for pt to be comfortable and to return home PT Goal Formulation: With family Time For Goal Achievement: 10/23/17 Potential to Achieve Goals: Fair    Frequency Min 2X/week   Barriers to discharge        Co-evaluation               AM-PAC PT "6 Clicks" Daily Activity  Outcome Measure Difficulty turning over in bed (including adjusting bedclothes, sheets and blankets)?: Unable Difficulty moving from lying on back to sitting on the side of the bed? : Unable Difficulty sitting down on and standing up from a chair with arms (e.g., wheelchair, bedside commode, etc,.)?: Unable Help needed moving to and from a bed to chair (including a wheelchair)?: Total Help needed walking in hospital room?: Total Help needed climbing 3-5 steps with a railing? : Total 6 Click Score: 6    End of Session   Activity Tolerance: Patient limited by fatigue;Patient limited by lethargy Patient left: in bed;with call bell/phone within reach;with bed alarm set;with family/visitor present   PT Visit Diagnosis: Muscle weakness (generalized) (M62.81);Difficulty in walking, not elsewhere classified (R26.2);Pain Pain - part of body: (back)    Time: 1020-1046 PT Time Calculation (min) (ACUTE ONLY): 26 min   Charges:   PT Evaluation $PT Eval Moderate Complexity: 1 Mod PT Treatments $Therapeutic  Activity: 8-22 mins   PT G Codes:          Dana AlertJannie Markell Keller, MPT Pager: [phone]

## 2017-10-10 DIAGNOSIS — R52 Pain, unspecified: Secondary | ICD-10-CM

## 2017-10-10 DIAGNOSIS — E039 Hypothyroidism, unspecified: Secondary | ICD-10-CM

## 2017-10-10 DIAGNOSIS — I10 Essential (primary) hypertension: Secondary | ICD-10-CM

## 2017-10-10 DIAGNOSIS — E7800 Pure hypercholesterolemia, unspecified: Secondary | ICD-10-CM

## 2017-10-10 DIAGNOSIS — E876 Hypokalemia: Secondary | ICD-10-CM

## 2017-10-10 DIAGNOSIS — Z515 Encounter for palliative care: Secondary | ICD-10-CM

## 2017-10-10 DIAGNOSIS — I5032 Chronic diastolic (congestive) heart failure: Secondary | ICD-10-CM

## 2017-10-10 DIAGNOSIS — F0281 Dementia in other diseases classified elsewhere with behavioral disturbance: Secondary | ICD-10-CM

## 2017-10-10 DIAGNOSIS — S32030A Wedge compression fracture of third lumbar vertebra, initial encounter for closed fracture: Principal | ICD-10-CM

## 2017-10-10 DIAGNOSIS — Z950 Presence of cardiac pacemaker: Secondary | ICD-10-CM

## 2017-10-10 DIAGNOSIS — Z8673 Personal history of transient ischemic attack (TIA), and cerebral infarction without residual deficits: Secondary | ICD-10-CM

## 2017-10-10 LAB — CBC WITH DIFFERENTIAL/PLATELET
BASOS ABS: 0 10*3/uL (ref 0.0–0.1)
BASOS PCT: 0 %
Eosinophils Absolute: 0.1 10*3/uL (ref 0.0–0.7)
Eosinophils Relative: 1 %
HEMATOCRIT: 37.1 % (ref 36.0–46.0)
HEMOGLOBIN: 11.7 g/dL — AB (ref 12.0–15.0)
Lymphocytes Relative: 6 %
Lymphs Abs: 0.6 10*3/uL — ABNORMAL LOW (ref 0.7–4.0)
MCH: 31.1 pg (ref 26.0–34.0)
MCHC: 31.5 g/dL (ref 30.0–36.0)
MCV: 98.7 fL (ref 78.0–100.0)
MONO ABS: 1.5 10*3/uL — AB (ref 0.1–1.0)
Monocytes Relative: 14 %
NEUTROS ABS: 8.6 10*3/uL — AB (ref 1.7–7.7)
Neutrophils Relative %: 79 %
Platelets: 252 10*3/uL (ref 150–400)
RBC: 3.76 MIL/uL — ABNORMAL LOW (ref 3.87–5.11)
RDW: 14.4 % (ref 11.5–15.5)
WBC: 10.8 10*3/uL — ABNORMAL HIGH (ref 4.0–10.5)

## 2017-10-10 LAB — COMPREHENSIVE METABOLIC PANEL
ALBUMIN: 2.5 g/dL — AB (ref 3.5–5.0)
ALK PHOS: 74 U/L (ref 38–126)
ALT: 9 U/L — AB (ref 14–54)
AST: 22 U/L (ref 15–41)
Anion gap: 10 (ref 5–15)
BILIRUBIN TOTAL: 1.2 mg/dL (ref 0.3–1.2)
BUN: 30 mg/dL — AB (ref 6–20)
CO2: 25 mmol/L (ref 22–32)
CREATININE: 1.05 mg/dL — AB (ref 0.44–1.00)
Calcium: 8.5 mg/dL — ABNORMAL LOW (ref 8.9–10.3)
Chloride: 102 mmol/L (ref 101–111)
GFR calc Af Amer: 50 mL/min — ABNORMAL LOW (ref >60)
GFR, EST NON AFRICAN AMERICAN: 43 mL/min — AB (ref >60)
GLUCOSE: 96 mg/dL (ref 65–99)
Potassium: 5.1 mmol/L (ref 3.5–5.1)
Sodium: 137 mmol/L (ref 135–145)
TOTAL PROTEIN: 5.6 g/dL — AB (ref 6.5–8.1)

## 2017-10-10 LAB — URINALYSIS, ROUTINE W REFLEX MICROSCOPIC
Bilirubin Urine: NEGATIVE
GLUCOSE, UA: NEGATIVE mg/dL
Ketones, ur: NEGATIVE mg/dL
Nitrite: NEGATIVE
Protein, ur: 30 mg/dL — AB
SPECIFIC GRAVITY, URINE: 1.017 (ref 1.005–1.030)
WBC, UA: >50 WBC/hpf — ABNORMAL HIGH (ref 0–5)
pH: 5 (ref 5.0–8.0)

## 2017-10-10 LAB — PHOSPHORUS: Phosphorus: 3.3 mg/dL (ref 2.5–4.6)

## 2017-10-10 LAB — MAGNESIUM: MAGNESIUM: 2.2 mg/dL (ref 1.7–2.4)

## 2017-10-10 MED ORDER — SODIUM CHLORIDE 0.9 % IV SOLN
1.0000 g | INTRAVENOUS | Status: AC
  Administered 2017-10-11 – 2017-10-13 (×3): 1 g via INTRAVENOUS
  Filled 2017-10-10 (×3): qty 1
  Filled 2017-10-10: qty 10

## 2017-10-10 MED ORDER — HYDRALAZINE HCL 20 MG/ML IJ SOLN
10.0000 mg | Freq: Four times a day (QID) | INTRAMUSCULAR | Status: DC | PRN
  Administered 2017-10-10 – 2017-10-14 (×5): 10 mg via INTRAVENOUS
  Filled 2017-10-10 (×5): qty 1

## 2017-10-10 MED ORDER — OXYCODONE-ACETAMINOPHEN 5-325 MG PO TABS
1.0000 | ORAL_TABLET | Freq: Four times a day (QID) | ORAL | Status: DC | PRN
  Administered 2017-10-11 – 2017-10-13 (×2): 1 via ORAL
  Filled 2017-10-10 (×2): qty 1

## 2017-10-10 MED ORDER — SENNOSIDES 8.8 MG/5ML PO SYRP
10.0000 mL | ORAL_SOLUTION | Freq: Two times a day (BID) | ORAL | Status: DC
  Administered 2017-10-10: 10 mL via ORAL
  Administered 2017-10-11: 1 mL via ORAL
  Administered 2017-10-11 – 2017-10-13 (×4): 10 mL via ORAL
  Filled 2017-10-10 (×6): qty 10

## 2017-10-10 MED ORDER — LOSARTAN POTASSIUM 25 MG PO TABS: 25.0000 mg | ORAL_TABLET | Freq: Every day | ORAL | Status: DC

## 2017-10-10 MED ORDER — CALCITONIN (SALMON) 200 UNIT/ACT NA SOLN
1.0000 | Freq: Every day | NASAL | Status: DC
  Administered 2017-10-10 – 2017-10-14 (×5): 1 via NASAL
  Filled 2017-10-10: qty 3.7

## 2017-10-10 MED ORDER — METHOCARBAMOL 500 MG PO TABS
500.0000 mg | ORAL_TABLET | Freq: Three times a day (TID) | ORAL | Status: DC
  Administered 2017-10-10 – 2017-10-13 (×8): 500 mg via ORAL
  Filled 2017-10-10 (×8): qty 1

## 2017-10-10 MED ORDER — SODIUM CHLORIDE 0.9 % IV SOLN
INTRAVENOUS | Status: DC
  Administered 2017-10-10 – 2017-10-12 (×3): via INTRAVENOUS

## 2017-10-10 NOTE — Progress Notes (Signed)
PROGRESS NOTE    Dana PilonKathleen W Keller  ZOX:[id number] DOB: 10/13/1918 DOA: 10/08/2017 PCP: Wilmer Floorampbell, Stephen D., MD  Brief Narrative:  The patient is 84 yo hx dementia, PPM, HTN, combined CHF, iron def, anemia, CVA had mech fall 2 days prior to presentation and is now unable to walk due to pain in her back and going down her hips.  Family saw her fall.    IN ED: xrays showed compression fractures of the spine at L3/ L4.  BP's high in ED, over 200, treated IV hydralazine.  She is admitted for pain control and now IR is consulting for possible kyphoplasty or vertebroplasty.  She is getting a nuclear medicine bone scan and palliative care is also going to evaluate for goals of care and symptom management.  Assessment & Plan:   Active Problems:   Hypothyroidism   HYPERCHOLESTEROLEMIA   Essential hypertension   PACEMAKER, PERMANENT   H/O: CVA (cerebrovascular accident)   Dementia   Chronic diastolic CHF (congestive heart failure) (HCC)   Hypokalemia   Compression fracture of L3 lumbar vertebra, closed, initial encounter (HCC)  Fall/ with resultant Vertebral compression fracture: of L3/L4 Lumbar Vertebra -Family wants aggressive Measures and wants to see if she is a candidate for Kyphoplasty/Verteborplasty -Interventional radiology consulted for evaluation and prior to them seeing we will obtain a nuclear medicine bone scan to see acuity of fractures -Palliative care consulted for goals of care as well as symptom and pain management -Continue current pain management with oxycodone 1 tablet p.o. every 6 as needed for moderate pain as well as Robaxin 5 mg 3 times daily  Suspected UTI -Urinalysis last night showed cloudy urine, with small blood in her urine, large leukocytes, nitrites, many bacteria, greater than 50 WBCs with few squamous cells. -Repeat urinalysis shows cloudy urine, small hemoglobin and urine, large leukocytes, negative nitrites, rare bacteria, as well as greater than 50  WBCs -Send urine culture -Start empiric antibiotics with IV Rocephin  Essential Hypertension -Blood pressure was severely elevated this morning -Started IV hydralazine as necessary -She would home metoprolol  -Hold losartan at this time given patient's acute kidney injury  Acute kidney injury -Patient's BUN and creatinine went from 19/0.69 two days ago and today was 30/1.05 -Avoid nephrotoxic medications and stop patient's home losartan -Start gentle IV fluid hydration normal saline at 50 mL's per hour -Due to monitor and repeat CMP in a.m.  Chronic diastolic CHF:  -Currrently somewhat on the dry and started IV fluid hydration -Continue to monitor  I am status extremely closely -Avoid overhydration  Dementia -Chronic -Expect some degree of sundowning while hospitalized  Hypothyroidism -Continue with Levothyroxine 112 mcg p.o. daily before breakfast  Hyperlipidemia -Continue with home pravastatin 4 m p.o. nightly  Hypokalemia -Improved.  She is potassium was 5.1 this morning -Continue to monitor and replete as necessary -Repeat CMP in AM  Hypomagnesemia, improved -Patient magnesium this morning was 2.2 -Need to monitor and replete as necessary -Repeat mag level in a.m.  Permanent pacemaker -We will have Medtronic tech interrogate pacer  DVT prophylaxis: SCDs Code Status: DO NOT RESUSCITATE Family Communication: Discussed with Daughter at bedside  Disposition Plan: Maintain patient for current work-up and treatment  Consultants:   Interventional Radiology  Palliative Care Medicine    Procedures: None   Antimicrobials:  Anti-infectives (From admission, onward)   Start     Dose/Rate Route Frequency Ordered Stop   10/10/17 1200  cefTRIAXone (ROCEPHIN) 1 g in sodium chloride 0.9 % 100 mL  IVPB     1 g 200 mL/hr over 30 Minutes Intravenous Every 24 hours 10/10/17 0934       Subjective: Seen and examined at bedside and is extremely hard of hearing.  She is also very pleasantly demented however not complaining of any pain at this time.  Daughter states that she had significant pain.  Nursing reported elevated blood sugars this a.m. likely secondary to pain.  No chest pain, shortness breath, nausea, vomiting.  Patient unable to tell me if she has any burning or discomfort in her urine at this time.  Objective: Vitals:   10/10/17 0420 10/10/17 0655 10/10/17 0851 10/10/17 1417  BP: (!) 184/73 (!) 213/84 (!) 122/58 (!) 114/58  Pulse: 62 70 (!) 59 60  Resp: 20   16  Temp: 98.5 F (36.9 C)   98.3 F (36.8 C)  TempSrc: Oral   Oral  SpO2: 99%   99%  Weight:      Height:        Intake/Output Summary (Last 24 hours) at 10/10/2017 1706 Last data filed at 10/10/2017 1300 Gross per 24 hour  Intake 290 ml  Output 750 ml  Net -460 ml   Filed Weights   10/09/17 0033  Weight: 61.7 kg (136 lb 0.4 oz)   Examination: Physical Exam:  Constitutional: Elderly thin Caucasian female currently in no acute distress appears calm but uncomfortable  Eyes: Lids and conjunctivae normal, sclerae anicteric  ENMT: External Ears, Nose appear normal. Hard of hearing. Mucous membranes are moist.  Neck: Appears normal, supple, no cervical masses, normal ROM, no appreciable thyromegaly, no JVD Respiratory: Diminished to auscultation bilaterally, no wheezing, rales, rhonchi or crackles.  Cardiovascular: RRR, no murmurs / rubs / gallops. S1 and S2 auscultated. No extremity edema.  Abdomen: Soft, non-tender, non-distended. No masses palpated. No appreciable hepatosplenomegaly. Bowel sounds positive x4.  GU: Deferred. Musculoskeletal: No clubbing / cyanosis of digits/nails. No joint deformity upper and lower extremities. Has Legs crossed Skin: No rashes, lesions, ulcers on a limited skin eval. No induration; Warm and dry.  Neurologic: CN 2-12 grossly intact with no focal deficits. Romberg sign and cerebellar reflexes not assessed.  Psychiatric: Impaired judgment and  insight.  Normal mood and appropriate affect.   Data Reviewed: I have personally reviewed following labs and imaging studies  CBC: Recent Labs  Lab 10/08/17 1629 10/09/17 0418 10/10/17 0811  WBC 12.1* 12.5* 10.8*  NEUTROABS 9.9*  --  8.6*  HGB 12.9 11.6* 11.7*  HCT 40.4 37.1 37.1  MCV 96.4 97.4 98.7  PLT 298 251 252   Basic Metabolic Panel: Recent Labs  Lab 10/08/17 1827 10/09/17 0418 10/10/17 0811  NA 142 137 137  K 3.1* 5.6* 5.1  CL 110 103 102  CO2 24 26 25   GLUCOSE 91 104* 96  BUN 19 24* 30*  CREATININE 0.69 0.79 1.05*  CALCIUM 7.1* 8.5* 8.5*  MG 1.2* 1.6* 2.2  PHOS  --  3.4 3.3   GFR: Estimated Creatinine Clearance: 26.4 mL/min (A) (by C-G formula based on SCr of 1.05 mg/dL (H)). Liver Function Tests: Recent Labs  Lab 10/09/17 0418 10/10/17 0811  AST 18 22  ALT 12* 9*  ALKPHOS 72 74  BILITOT 0.7 1.2  PROT 6.0* 5.6*  ALBUMIN 2.7* 2.5*   No results for input(s): LIPASE, AMYLASE in the last 168 hours. No results for input(s): AMMONIA in the last 168 hours. Coagulation Profile: No results for input(s): INR, PROTIME in the last 168 hours. Cardiac Enzymes:  No results for input(s): CKTOTAL, CKMB, CKMBINDEX, TROPONINI in the last 168 hours. BNP (last 3 results) No results for input(s): PROBNP in the last 8760 hours. HbA1C: No results for input(s): HGBA1C in the last 72 hours. CBG: No results for input(s): GLUCAP in the last 168 hours. Lipid Profile: No results for input(s): CHOL, HDL, LDLCALC, TRIG, CHOLHDL, LDLDIRECT in the last 72 hours. Thyroid Function Tests: Recent Labs    10/09/17 0418  TSH 1.999   Anemia Panel: No results for input(s): VITAMINB12, FOLATE, FERRITIN, TIBC, IRON, RETICCTPCT in the last 72 hours. Sepsis Labs: No results for input(s): PROCALCITON, LATICACIDVEN in the last 168 hours.  No results found for this or any previous visit (from the past 240 hour(s)).   Radiology Studies: Ct Head Wo Contrast  Result Date:  10/08/2017 CLINICAL DATA:  Fall, on Plavix EXAM: CT HEAD WITHOUT CONTRAST TECHNIQUE: Contiguous axial images were obtained from the base of the skull through the vertex without intravenous contrast. COMPARISON:  04/27/2013 FINDINGS: Brain: There is atrophy and chronic small vessel disease changes. No acute intracranial abnormality. Specifically, no hemorrhage, hydrocephalus, mass lesion, acute infarction, or significant intracranial injury. Vascular: No hyperdense vessel or unexpected calcification. Skull: No acute calvarial abnormality. Sinuses/Orbits: Visualized paranasal sinuses and mastoids clear. Orbital soft tissues unremarkable. Other: None IMPRESSION: No acute intracranial abnormality. Atrophy, chronic microvascular disease. Electronically Signed   By: Charlett Nose M.D.   On: 10/08/2017 20:57   Scheduled Meds: . acetaminophen  650 mg Oral Once  . citalopram  20 mg Oral Daily  . levothyroxine  112 mcg Oral QAC breakfast  . methocarbamol  500 mg Oral TID  . metoprolol tartrate  6.25 mg Oral Daily   And  . metoprolol tartrate  12.5 mg Oral QHS  . pravastatin  40 mg Oral QHS   Continuous Infusions: . sodium chloride 50 mL/hr at 10/10/17 1150  . cefTRIAXone (ROCEPHIN)  IV Stopped (10/10/17 1153)    LOS: 2 days   Merlene Laughter, DO [facility] Pager [phone]  If 7PM-7AM, please contact night-coverage [email or web address] Password [email or web address] 10/10/2017, 5:06 PM

## 2017-10-10 NOTE — Clinical Social Work Note (Signed)
Clinical Social Work Assessment  Patient Details  Name: [name] MRN: [id number] Date of Birth: 1918-08-11  Date of referral:  10/10/17               Reason for consult:  Facility Placement                Permission sought to share information with:    Permission granted to share information::     Name::        Agency::     Relationship::     Contact Information:     Housing/Transportation Living arrangements for the past 2 months:  Single Family Home Source of Information:  Adult Children(Daughter - Energy manager) Patient Interpreter Needed:  None Criminal Activity/Legal Involvement Pertinent to Current Situation/Hospitalization:  No - Comment as needed Significant Relationships:  Adult Children Lives with:  Self, Adult Children(24/7 care from Adult Children ) Do you feel safe going back to the place where you live?  (PT recommending SNF;Home health PT;Supervision/Assistance - 24 hour(depending on palliative care meeting. Family will likely take pt back home per daughter.)) Need for family participation in patient care:  Yes (Comment)  Care giving concerns:  Patient from home with with 24/7 hour care. Patient's daughter [name]) reported that she and her brothers rotate to care for patient 24 hours/day. Patient's daughter reported that prior to patient's fall on Monday patient was ambulating with a walker and going to the restroom independently. Patient's daughter reported that patient needs assistance with bathing and that patient was feeding herself independently. PT recommending  "(PT recommending SNF;Home health PT;Supervision/Assistance - 24 hour(depending on palliative care meeting. Family will likely take pt back home per daughter.)"   Office manager / plan:  CSW spoke with patient's daughter at bedside regarding discharge planning, patient asleep. Patient's daughter reported that she has a meeting with radiology and palliative to discuss patient's care  further. Patient's daughter reported that she and her brothers want patient to have a procedure to relieve her pain so patient can be comfortable. Patient's daughter reported that they are waiting for a meeting with radiology to see if patient can have the procedure. Patient's daughter reported that she does not feel that ST rehab will help patient. Patient's daughter reported that they are just interested in getting patient pain relief. CSW agreed to meet with patient's daughter after they meet with radiology and palliative to discuss discharge plans further.  CSW will continue to follow and assist with discharge planning.   Employment status:  Retired Database administrator PT Recommendations:  Skilled Holiday representative, Home with Home Health, 24 Hour Supervision Information / Referral to community resources:     Patient/Family's Response to care:  Patient's daughter appreciative of CSW assistance with discharge planning. Patient's daughter awaiting meeting with palliative and radiology to determine treatment plan for patient.   Patient/Family's Understanding of and Emotional Response to Diagnosis, Current Treatment, and Prognosis:  Patient asleep during assessment. Patient's daughter involved in patient's care and verbalized understanding of diagnosis. Patient's daughter reported that she is awaiting a meeting with radiology and palliative to discuss patient's care further. Patient's daughter reported that she wants patient to get a procedure to relieve patient's pain so patient can have a better quality of life. CSW and patient's daughter discussed patient's previous quality of life before recent fall. CSW provided active listening.   Emotional Assessment Appearance:  Appears stated age Attitude/Demeanor/Rapport:  Unable to Assess Affect (typically observed):  Unable to  Assess(Patient asleep) Orientation:  Oriented to Self Alcohol / Substance use:  Not Applicable Psych  involvement (Current and /or in the community):  No (Comment)  Discharge Needs  Concerns to be addressed:  Care Coordination Readmission within the last 30 days:  No Current discharge risk:  Dependent with Mobility Barriers to Discharge:  Continued Medical Work up   USG CorporationKimberly L Rewa Weissberg, LCSW 10/10/2017, 12:15 PM

## 2017-10-10 NOTE — Progress Notes (Signed)
Patient's blood pressure elevated this am; 184/73 and 213/84. Rounding MD was notified via text page. No new orders received at this time. Will continue to monitor patient.

## 2017-10-11 LAB — COMPREHENSIVE METABOLIC PANEL
ALT: 10 U/L — ABNORMAL LOW (ref 14–54)
AST: 17 U/L (ref 15–41)
Albumin: 2.4 g/dL — ABNORMAL LOW (ref 3.5–5.0)
Alkaline Phosphatase: 85 U/L (ref 38–126)
Anion gap: 9 (ref 5–15)
BUN: 32 mg/dL — ABNORMAL HIGH (ref 6–20)
CHLORIDE: 105 mmol/L (ref 101–111)
CO2: 24 mmol/L (ref 22–32)
CREATININE: 0.82 mg/dL (ref 0.44–1.00)
Calcium: 8.2 mg/dL — ABNORMAL LOW (ref 8.9–10.3)
GFR, EST NON AFRICAN AMERICAN: 58 mL/min — AB (ref >60)
Glucose, Bld: 108 mg/dL — ABNORMAL HIGH (ref 65–99)
POTASSIUM: 4.4 mmol/L (ref 3.5–5.1)
Sodium: 138 mmol/L (ref 135–145)
Total Bilirubin: 0.4 mg/dL (ref 0.3–1.2)
Total Protein: 5.3 g/dL — ABNORMAL LOW (ref 6.5–8.1)

## 2017-10-11 LAB — CBC WITH DIFFERENTIAL/PLATELET
Basophils Absolute: 0 10*3/uL (ref 0.0–0.1)
Basophils Relative: 0 %
EOS PCT: 1 %
Eosinophils Absolute: 0.1 10*3/uL (ref 0.0–0.7)
HCT: 35.9 % — ABNORMAL LOW (ref 36.0–46.0)
Hemoglobin: 11.2 g/dL — ABNORMAL LOW (ref 12.0–15.0)
LYMPHS PCT: 8 %
Lymphs Abs: 0.9 10*3/uL (ref 0.7–4.0)
MCH: 30.6 pg (ref 26.0–34.0)
MCHC: 31.2 g/dL (ref 30.0–36.0)
MCV: 98.1 fL (ref 78.0–100.0)
MONO ABS: 1.5 10*3/uL — AB (ref 0.1–1.0)
MONOS PCT: 14 %
Neutro Abs: 8.2 10*3/uL — ABNORMAL HIGH (ref 1.7–7.7)
Neutrophils Relative %: 77 %
PLATELETS: 250 10*3/uL (ref 150–400)
RBC: 3.66 MIL/uL — ABNORMAL LOW (ref 3.87–5.11)
RDW: 14.4 % (ref 11.5–15.5)
WBC: 10.6 10*3/uL — ABNORMAL HIGH (ref 4.0–10.5)

## 2017-10-11 LAB — MAGNESIUM: MAGNESIUM: 2.1 mg/dL (ref 1.7–2.4)

## 2017-10-11 LAB — PHOSPHORUS: PHOSPHORUS: 2.8 mg/dL (ref 2.5–4.6)

## 2017-10-11 NOTE — Progress Notes (Signed)
Daily Progress Note   Patient Name: [name]       Date: 10/11/2017 DOB: July 31, 1918  Age: 84 y.o. MRN#: [id number] Attending Physician: Merlene Laughter, DO Primary Care Physician: Wilmer Floor., MD Admit Date: 10/08/2017  Reason for Consultation/Follow-up: Establishing goals of care and Pain control  Subjective: I checked in on [name] today.  Main sleepy and minimally participates in conversation.  Her daughter and granddaughter at bedside.  We discussed again their concerns and hopes regarding compression fracture.  Her daughter is very hopeful she will be able to have kyphoplasty completed.  We talked some more about her concerns regarding plan for discharging after she leaves the hospital.  Originally, she was very set on taking her mother home, however, she now is questioning if she may benefit from a short-term stay at rehab.  Length of Stay: 3  Current Medications: Scheduled Meds:  . acetaminophen  650 mg Oral Once  . calcitonin (salmon)  1 spray Alternating Nares Daily  . citalopram  20 mg Oral Daily  . levothyroxine  112 mcg Oral QAC breakfast  . methocarbamol  500 mg Oral TID  . metoprolol tartrate  6.25 mg Oral Daily   And  . metoprolol tartrate  12.5 mg Oral QHS  . pravastatin  40 mg Oral QHS  . sennosides  10 mL Oral BID    Continuous Infusions: . sodium chloride 50 mL/hr at 10/11/17 0822  . cefTRIAXone (ROCEPHIN)  IV Stopped (10/11/17 1811)    PRN Meds: acetaminophen **OR** acetaminophen, hydrALAZINE, ondansetron **OR** ondansetron (ZOFRAN) IV, oxyCODONE-acetaminophen, polyethylene glycol  Physical Exam       General: Sleepy but arousable.  Does not participate in conversation she continues to fall back asleep.  HEENT: No bruits, no  goiter, no JVD Heart: Regular rate and rhythm. No murmur appreciated. Lungs: Diminished air movement, clear Abdomen: Soft, nontender, nondistended, positive bowel sounds.  Ext: No significant edema Skin: Warm and dry  Vital Signs: BP 135/60 (BP Location: Left Arm)   Pulse 68   Temp 98.9 F (37.2 C) (Oral)   Resp 20   Ht  (1.575 m)   Wt 61.7 kg (136 lb 0.4 oz)   SpO2 99%   BMI 24.88 kg/m  SpO2: SpO2: 99 % O2 Device: O2 Device: Nasal Cannula O2  Flow Rate: O2 Flow Rate (L/min): 2 L/min  Intake/output summary:   Intake/Output Summary (Last 24 hours) at 10/11/2017 2207 Last data filed at 10/11/2017 1813 Gross per 24 hour  Intake 1000 ml  Output 525 ml  Net 475 ml   LBM: Last BM Date: 10/08/17 Baseline Weight: Weight: 61.7 kg (136 lb 0.4 oz) Most recent weight: Weight: 61.7 kg (136 lb 0.4 oz)       Palliative Assessment/Data:    Flowsheet Rows     Most Recent Value  Intake Tab  Referral Department  Hospitalist  Unit at Time of Referral  Med/Surg Unit  Palliative Care Primary Diagnosis  Other (Comment) [Compression fx]  Clinical Assessment  Psychosocial & Spiritual Assessment  Palliative Care Outcomes      Patient Active Problem List   Diagnosis Date Noted  . Compression fracture of L3 lumbar vertebra, closed, initial encounter (HCC) 10/08/2017  . Acute on chronic combined CHF:  (08/28/2016) EF 60-65% 08/28/2016  . Pacemaker at end of battery life: pacemaker generator change on 08/28/2016 08/26/2016  . Acute diastolic heart failure (HCC) 08/26/2016  . UTI (lower urinary tract infection) 02/28/2015  . NSTEMI (non-ST elevated myocardial infarction) (HCC) 02/28/2015  . SOB (shortness of breath) 02/28/2015  . Hypokalemia 02/28/2015  . Chronic diastolic CHF (congestive heart failure) (HCC) 06/13/2014  . Chest discomfort 06/01/2014  . Dementia 06/01/2014  . Sinus node dysfunction s/p Medtronic pacemaker 2008 06/01/2014  . Delirium 04/27/2013  . H/O: CVA  (cerebrovascular accident) 04/27/2013  . Hypothyroidism 02/24/2009  . HYPERCHOLESTEROLEMIA 02/24/2009  . Iron deficiency anemia 02/24/2009  . Essential hypertension 02/24/2009  . BRADYCARDIA 02/24/2009  . OSTEOARTHRITIS 02/24/2009  . DIZZINESS 02/24/2009  . PACEMAKER, PERMANENT 02/24/2009    Palliative Care Assessment & Plan   Patient Profile: 84 y.o. female  with past medical history of mention, sinus node dysfunction status post pacemaker, hypertension, hypothyroidism, CVA, heart failure, and iron deficiency anemia admitted on 10/08/2017 with pain after fall and found to have compression fracture of lumbar spine.  Await results of bone scan to see about potential for IR to perform kyphoplasty.  Palliative consulted for goals of care and pain management  Assessment: Patient Active Problem List   Diagnosis Date Noted  . Compression fracture of L3 lumbar vertebra, closed, initial encounter (HCC) 10/08/2017  . Acute on chronic combined CHF:  (08/28/2016) EF 60-65% 08/28/2016  . Pacemaker at end of battery life: pacemaker generator change on 08/28/2016 08/26/2016  . Acute diastolic heart failure (HCC) 08/26/2016  . UTI (lower urinary tract infection) 02/28/2015  . NSTEMI (non-ST elevated myocardial infarction) (HCC) 02/28/2015  . SOB (shortness of breath) 02/28/2015  . Hypokalemia 02/28/2015  . Chronic diastolic CHF (congestive heart failure) (HCC) 06/13/2014  . Chest discomfort 06/01/2014  . Dementia 06/01/2014  . Sinus node dysfunction s/p Medtronic pacemaker 2008 06/01/2014  . Delirium 04/27/2013  . H/O: CVA (cerebrovascular accident) 04/27/2013  . Hypothyroidism 02/24/2009  . HYPERCHOLESTEROLEMIA 02/24/2009  . Iron deficiency anemia 02/24/2009  . Essential hypertension 02/24/2009  . BRADYCARDIA 02/24/2009  . OSTEOARTHRITIS 02/24/2009  . DIZZINESS 02/24/2009  . PACEMAKER, PERMANENT 02/24/2009    Recommendations/Plan: Pain: Family reports well-controlled today.  Continue same  regimen.  Await results of bone scan and IR evaluation to determine if she may benefit from kyphoplasty.  Code Status:    Code Status Orders  (From admission, onward)        Start     Ordered   10/09/17 0020  Do not attempt resuscitation (  DNR)  Continuous    Question Answer Comment  In the event of cardiac or respiratory ARREST Do not call a "code blue"   In the event of cardiac or respiratory ARREST Do not perform Intubation, CPR, defibrillation or ACLS   In the event of cardiac or respiratory ARREST Use medication by any route, position, wound care, and other measures to relive pain and suffering. May use oxygen, suction and manual treatment of airway obstruction as needed for comfort.      10/09/17 0019    Code Status History    Date Active Date Inactive Code Status Order ID Comments User Context   08/26/2016 2021 08/28/2016 2133 Full Code [id number]  Chrystie Nose, MD Inpatient   02/28/2015 1638 03/02/2015 1859 Full Code [id number]  Rodolph Bong, MD Inpatient   04/27/2013 1558 04/28/2013 2004 Full Code [id number]  Esperanza Sheets, MD Inpatient    Advance Directive Documentation     Most Recent Value  Type of Advance Directive  Healthcare Power of Attorney, Living will  Pre-existing out of facility DNR order (yellow form or pink MOST form)  -  "MOST" Form in Place?  -       Prognosis:   Unable to determine  Discharge Planning:  To Be Determined  Care plan was discussed with daughter and granddaughter  Thank you for allowing the Palliative Medicine Team to assist in the care of this patient.   Total Time 20 Prolonged Time Billed No      Greater than 50%  of this time was spent counseling and coordinating care related to the above assessment and plan.  Romie Minus, MD  Please contact Palliative Medicine Team phone at [phone] for questions and concerns.

## 2017-10-11 NOTE — Progress Notes (Signed)
PROGRESS NOTE    [name]  ZOX:[id number] DOB: [birth year] DOA: 10/08/2017 PCP: Wilmer Floor., MD  Brief Narrative:  The patient is 84 yo hx dementia, PPM, HTN, combined CHF, iron def, anemia, CVA had mech fall 2 days prior to presentation and is now unable to walk due to pain in her back and going down her hips.  Family saw her fall.    IN ED: xrays showed compression fractures of the spine at L3/ L4.  BP's high in ED, over 200, treated IV hydralazine.  She is admitted for pain control and now IR is consulting for possible kyphoplasty or vertebroplasty.  She is getting a nuclear medicine bone scan and palliative care is also going to evaluate for goals of care and symptom management.  Nuclear medicine bone scan still pending as well as IR consultation.  Family wants to proceed with recommendations from IR prior to discussing further goals of care with Palliative.  Assessment & Plan:   Active Problems:   Hypothyroidism   HYPERCHOLESTEROLEMIA   Essential hypertension   PACEMAKER, PERMANENT   H/O: CVA (cerebrovascular accident)   Dementia   Chronic diastolic CHF (congestive heart failure) (HCC)   Hypokalemia   Compression fracture of L3 lumbar vertebra, closed, initial encounter (HCC)  Fall/ with resultant Vertebral compression fracture: of L3/L4 Lumbar Vertebra -Family wants aggressive Measures and wants to see if she is a candidate for Kyphoplasty/Verteborplasty of the desire to transition the patient to the home environment -Interventional radiology consulted for evaluation and prior to them seeing we will obtain a nuclear medicine bone scan to see acuity of fractures -Nuclear medicine bone scan is still currently pending and unfortunately likely will not be done until Monday per nursing report; We will call IR in the morning. -Palliative care consulted for goals of care as well as symptom and pain management -Current pain management with oxycodone 1 tablet p.o. every 6  as needed for moderate pain and by palliative and is been decreased to 5/325 rather than 7.5/325 -C/w Robaxin 5 mg 3 times daily  Suspected UTI -Urinalysis last night showed cloudy urine, with small blood in her urine, large leukocytes, nitrites, many bacteria, greater than 50 WBCs with few squamous cells. -Repeat urinalysis shows cloudy urine, small hemoglobin and urine, large leukocytes, negative nitrites, rare bacteria, as well as greater than 50 WBCs -Sent urine culture and pending  -Start empiric antibiotics with IV Rocephin  Essential Hypertension -Blood pressure was severely elevated yesterday -Started IV hydralazine as necessary -C/w Home Metoprolol  -Hold losartan at this time given patient's acute kidney injury  Acute kidney injury -Patient's BUN and creatinine went from 19/0.69 three days ago yesterday was 30/1.05 -Avoid nephrotoxic medications and stop patient's home losartan -Started gentle IV fluid hydration normal saline at 50 mL's per hour and BUN/Cr improved to 32/0.82 -Continue to monitor and repeat CMP in a.m.  Chronic Diastolic CHF -Currrently somewhat on the dry and started IV fluid hydration -Continue to monitor volume extremely closely -Avoid overhydration  Dementia -Chronic -Expect some degree of sundowning while hospitalized -Placed on delirium precautions  Hypothyroidism -Continue with Levothyroxine 112 mcg p.o. daily before breakfast  Hyperlipidemia -Continue with home pravastatin 40 m p.o. nightly  Hyperkalemia -Improved.  She is potassium was 4.4 this morning -Continue to monitor and replete as necessary -Repeat CMP in AM  Hypomagnesemia, improved -Patient magnesium this morning was 2.1 -Need to monitor and replete as necessary -Repeat mag level in a.m.  Permanent pacemaker -We will  have Medtronic tech interrogate pacer  DVT prophylaxis: SCDs Code Status: DO NOT RESUSCITATE Family Communication: Discussed with Grand-daughter at  bedside  Disposition Plan: Maintain patient for current work-up and treatment  Consultants:   Interventional Radiology  Palliative Care Medicine    Procedures: None   Antimicrobials:  Anti-infectives (From admission, onward)   Start     Dose/Rate Route Frequency Ordered Stop   10/10/17 1200  cefTRIAXone (ROCEPHIN) 1 g in sodium chloride 0.9 % 100 mL IVPB     1 g 200 mL/hr over 30 Minutes Intravenous Every 24 hours 10/10/17 0934       Subjective: Seen and examined at bedside and was resting and denied any pain.  No lightheadedness or dizziness.  Patient's niece was at bedside and all questions answered to her satisfaction.  Objective: Vitals:   10/10/17 0851 10/10/17 1417 10/10/17 2242 10/11/17 0623  BP: (!) 122/58 (!) 114/58 (!) 140/95 (!) 147/67  Pulse: (!) 59 60 62 62  Resp:  Temp:  98.3 F (36.8 C) 99.8 F (37.7 C) 99.7 F (37.6 C)  TempSrc:  Oral  Oral  SpO2:  99% 97% 100%  Weight:      Height:        Intake/Output Summary (Last 24 hours) at 10/11/2017 0811 Last data filed at 10/11/2017 0700 Gross per 24 hour  Intake 1188.33 ml  Output 600 ml  Net 588.33 ml   Filed Weights   10/09/17 0033  Weight: 61.7 kg (136 lb 0.4 oz)   Examination: Physical Exam:  Constitutional: Elderly thin Caucasian female who is currently in no acute distress.  Appears calm comfortable. Eyes: Sclera are anicteric.  Conjunctive are noninjected ENMT: External ears and nose appear normal.  Patient is extremely hard of hearing.  Mucous membranes are moist Neck: Appears supple.  No JVD Respiratory: Diminished to auscultation bilaterally.  No appreciable wheezing, rales, rhonchi. Cardiovascular: Regular rate and rhythm.  No appreciable murmurs, rubs, gallops.  No lower extremity edema Abdomen: Soft, nontender, nondistended.  Bowel sounds present x4 GU: Deferred Musculoskeletal: No contractures.  No cyanosis.  Continues to have her legs crossed.  No joint deformities  noted Skin: Warm and dry.  No appreciable rashes or lesions on a limited skin evaluation.  Has some bruising on the lower extremities Neurologic: Cranial nerves II through XII grossly intact with no appreciable focal deficits.  Romberg sign and cerebellar reflexes not assessed  Psychiatric: Impaired judgment and insight.  Pleasantly confused.  Normal mood and affect  Data Reviewed: I have personally reviewed following labs and imaging studies  CBC: Recent Labs  Lab 10/08/17 1629 10/09/17 0418 10/10/17 0811 10/11/17 0447  WBC 12.1* 12.5* 10.8* 10.6*  NEUTROABS 9.9*  --  8.6* 8.2*  HGB 12.9 11.6* 11.7* 11.2*  HCT 40.4 37.1 37.1 35.9*  MCV 96.4 97.4 98.7 98.1  PLT 298 251 252 250   Basic Metabolic Panel: Recent Labs  Lab 10/08/17 1827 10/09/17 0418 10/10/17 0811 10/11/17 0447  NA 142 137 137 138  K 3.1* 5.6* 5.1 4.4  CL 110 103 102 105  CO2 GLUCOSE 91 104* 96 108*  BUN 19 24* 30* 32*  CREATININE 0.69 0.79 1.05* 0.82  CALCIUM 7.1* 8.5* 8.5* 8.2*  MG 1.2* 1.6* 2.2 2.1  PHOS  --  3.4 3.3 2.8   GFR: Estimated Creatinine Clearance: 33.9 mL/min (by C-G formula based on SCr of 0.82 mg/dL). Liver Function Tests: Recent Labs  Lab 10/09/17  1610 10/10/17 0811 10/11/17 0447  AST ALT 12* 9* 10*  ALKPHOS 72 74 85  BILITOT 0.7 1.2 0.4  PROT 6.0* 5.6* 5.3*  ALBUMIN 2.7* 2.5* 2.4*   No results for input(s): LIPASE, AMYLASE in the last 168 hours. No results for input(s): AMMONIA in the last 168 hours. Coagulation Profile: No results for input(s): INR, PROTIME in the last 168 hours. Cardiac Enzymes: No results for input(s): CKTOTAL, CKMB, CKMBINDEX, TROPONINI in the last 168 hours. BNP (last 3 results) No results for input(s): PROBNP in the last 8760 hours. HbA1C: No results for input(s): HGBA1C in the last 72 hours. CBG: No results for input(s): GLUCAP in the last 168 hours. Lipid Profile: No results for input(s): CHOL, HDL, LDLCALC, TRIG, CHOLHDL,  LDLDIRECT in the last 72 hours. Thyroid Function Tests: Recent Labs    10/09/17 0418  TSH 1.999   Anemia Panel: No results for input(s): VITAMINB12, FOLATE, FERRITIN, TIBC, IRON, RETICCTPCT in the last 72 hours. Sepsis Labs: No results for input(s): PROCALCITON, LATICACIDVEN in the last 168 hours.  No results found for this or any previous visit (from the past 240 hour(s)).   Radiology Studies: No results found. Scheduled Meds: . acetaminophen  650 mg Oral Once  . calcitonin (salmon)  1 spray Alternating Nares Daily  . citalopram  20 mg Oral Daily  . levothyroxine  112 mcg Oral QAC breakfast  . methocarbamol  500 mg Oral TID  . metoprolol tartrate  6.25 mg Oral Daily   And  . metoprolol tartrate  12.5 mg Oral QHS  . pravastatin  40 mg Oral QHS  . sennosides  10 mL Oral BID   Continuous Infusions: . sodium chloride 50 mL/hr at 10/10/17 1150  . cefTRIAXone (ROCEPHIN)  IV 200 mL/hr at 10/10/17 1804    LOS: 3 days   Merlene Laughter, DO [facility] Pager [phone]  If 7PM-7AM, please contact night-coverage [email or web address] Password [email or web address] 10/11/2017, 8:11 AM

## 2017-10-11 NOTE — Consult Note (Signed)
Consultation Note Date: 10/11/2017   Patient Name: [name]  DOB: 06-May-1919  MRN: [id number]  Age / Sex: 84 y.o., female  PCP: Helen Hashimoto., MD Referring Physician: Kerney Elbe, DO  Reason for Consultation: Establishing goals of care and Pain control  HPI/Patient Profile: 84 y.o. female  with past medical history of mention, sinus node dysfunction status post pacemaker, hypertension, hypothyroidism, CVA, heart failure, and iron deficiency anemia admitted on 10/08/2017 with pain after fall and found to have compression fracture of lumbar spine.  Await results of bone scan to see about potential for IR to perform kyphoplasty.  Palliative consulted for goals of care and pain management  Clinical Assessment and Goals of Care: I met today with patient's daughter and granddaughter.  [name] slept through most of the encounter but did awaken at the end night complaints of pain at that time.  Her daughter reports that the most important thing to her is her family and being at home.  She has multiple children who have been sharing responsibilities of caring for her 24 hours a day.  She has been able to help perform some of her ADLs, however, her daughter reports that they have been noticing functional decline at home.  Her daughter is adamant that family does not see a purpose in pursuing short-term therapy on discharge as they do not think she is going to be able to participate and would be much more comfortable at home.  Her daughter also reports that at home her mother has chronic pain for which she has been on Percocet for years.  She states that in the past she had taken Percocet throughout the day but was too sleepy.  They therefore tried to wean her off of Percocet altogether and she started having symptoms of withdrawal.  At this point, she currently receives half of a Percocet 7.5 mg/314m  before going to bed.  Her daughter reports that since she has been in the hospital she has been receiving higher dose of opioids and it "makes her sleep all day."  She reports that at baseline her mother is usually fairly sleepy but they think they have seen a distinct change since she has been in the hospital and been receiving higher doses of opioid medications.  Her daughter requests that we work to minimize and eventually stop her opioids.  At this time, we discussed the risk versus benefit of medications for pain management including opioids and the fact that she has an acute compression fracture.  We discussed please continue with opioids at least through the weekend until we see if she is a candidate for intervention by IR.  She did ask if dose could be reduced as she feels that her mother is too sleepy.  We therefore agreed to decrease to Percocet 5/325 overnight and see if she has an increase in her pain or if she is more awake after getting the lower dose tomorrow.  SUMMARY OF RECOMMENDATIONS   Pain: Family reports patient is oversedated  compared to baseline.  Requesting decrease in opioids.  Patient wakes and denies pain during my encounter.  Will therefore trial percocet 5/325 rather than percocet 7.5/325.   Will also start calcitonin nasal spray.  Constipation: RN reports does better with liquids.  Start senna BID.  This may also be contributing to urinary retention.  Goals of care: Family is clear in desire to transition home when she discharges.  To see recommendations from interventional radiology for potential kyphoplasty prior to any further discussions about long-term goals of care discharge planning.  I will continue to follow with them and progress conversation based on clinical course and family's willingness to engage in conversation regarding long-term goals of care.     Code Status/Advance Care Planning:  DNR   Symptom Management:   As above  Palliative Prophylaxis:    Bowel Regimen and Frequent Pain Assessment  Prognosis:   Unable to determine  Discharge Planning: To Be Determined      Primary Diagnoses: Present on Admission: . Essential hypertension . Chronic diastolic CHF (congestive heart failure) ([place]) . Dementia . HYPERCHOLESTEROLEMIA . Hypokalemia . Hypothyroidism . PACEMAKER, PERMANENT . Compression fracture of L3 lumbar vertebra, closed, initial encounter ([place])   I have reviewed the medical record, interviewed the patient and family, and examined the patient. The following aspects are pertinent.  Past Medical History:  Diagnosis Date  . Anxiety   . Chronic combined systolic and diastolic CHF (congestive heart failure) ([place])   . Complication of anesthesia    ANXIETY  . Confusion Adm - 04/27/2013  . Dementia    "early" (02/28/2015)  . Depression   . History of stroke    a. Per chart.  Marland Kitchen HTN (hypertension)   . Hypothyroidism   . Iron deficiency anemia, unspecified   . LV dysfunction    a. 2D Echo 03/01/15: poor image quality, septal HK, ? inferior wall as well, mild LVH, mild focal basal hypertrophy of septum, EF 45-50%, mildly dilated LA, PASP 7mHg.  . Multiple falls    "over the last 6 months she's had 4" (02/28/2015)  . NSTEMI (non-ST elevated [place] infarction) [place] 02[place]   a. Dx 02/2015 - treated medically.  . Osteoarthritis    "everywhere"  . Overactive bladder   . Pure hypercholesterolemia   . Symptomatic bradycardia    a. Near syncope s/p Medtronic pacemaker 2008. b. s/p gen change in 08/2016.  .Marland KitchenTIA (transient ischemic attack) "many of them"   Social History   Socioeconomic History  . Marital status: Widowed    Spouse name: Not on file  . Number of children: Not on file  . Years of education: Not on file  . Highest education level: Not on file  Occupational History  . Not on file  Social Needs  . Financial resource strain: Not on file  . Food insecurity:    Worry: Not on file    Inability:  Not on file  . Transportation needs:    Medical: Not on file    Non-medical: Not on file  Tobacco Use  . Smoking status: Never Smoker  . Smokeless tobacco: Never Used  Substance and Sexual Activity  . Alcohol use: No  . Drug use: No  . Sexual activity: Not on file  Lifestyle  . Physical activity:    Days per week: Not on file    Minutes per session: Not on file  . Stress: Not on file  Relationships  . Social connections:    Talks on  phone: Not on file    Gets together: Not on file    Attends religious service: Not on file    Active member of club or organization: Not on file    Attends meetings of clubs or organizations: Not on file    Relationship status: Not on file  Other Topics Concern  . Not on file  Social History Narrative   Lives with her husband.    Family History  Problem Relation Age of Onset  . Tuberculosis Mother   . Coronary artery disease Other   . Stroke Other   . COPD Other    Scheduled Meds: . acetaminophen  650 mg Oral Once  . calcitonin (salmon)  1 spray Alternating Nares Daily  . citalopram  20 mg Oral Daily  . levothyroxine  112 mcg Oral QAC breakfast  . methocarbamol  500 mg Oral TID  . metoprolol tartrate  6.25 mg Oral Daily   And  . metoprolol tartrate  12.5 mg Oral QHS  . pravastatin  40 mg Oral QHS  . sennosides  10 mL Oral BID   Continuous Infusions: . sodium chloride 50 mL/hr at 10/10/17 1150  . cefTRIAXone (ROCEPHIN)  IV 200 mL/hr at 10/10/17 1804   PRN Meds:.acetaminophen **OR** acetaminophen, hydrALAZINE, ondansetron **OR** ondansetron (ZOFRAN) IV, oxyCODONE-acetaminophen, polyethylene glycol Medications Prior to Admission:  Prior to Admission medications   Medication Sig Start Date End Date Taking? Authorizing Provider  aspirin 81 MG chewable tablet Chew 1 tablet (81 mg total) by mouth daily. 03/02/15  Yes Barton Dubois, MD  CALCIUM PO Take by mouth.   Yes [provider]  Cholecalciferol (VITAMIN D PO) Take by mouth.    Yes [provider]  citalopram (CELEXA) 20 MG tablet Take 20 mg by mouth daily.    Yes [provider]  clopidogrel (PLAVIX) 75 MG tablet Take 1 tablet (75 mg total) by mouth daily. 10/02/17  Yes Evans Lance, MD  levothyroxine (SYNTHROID, LEVOTHROID) 112 MCG tablet Take 112 mcg by mouth daily before breakfast.   Yes [provider]  losartan (COZAAR) 25 MG tablet Take 25 mg by mouth at bedtime.  06/13/16  Yes [provider]  metoprolol tartrate (LOPRESSOR) 25 MG tablet Take 0.5 tablets (12.5 mg total) by mouth 2 (two) times daily. Patient taking differently: Take 6.25-12.5 mg by mouth See admin instructions. 6.25 mg in the morning and 12.5 mg at bedtime ONLY IF SYSTOLIC B/P IS 536 OR GREATER AND PULSE RATE IS 60 BPM OR GREATER 06/01/15  Yes Evans Lance, MD  Multiple Vitamin (MULTIVITAMIN) tablet Take 1 tablet by mouth daily.     Yes [provider]  multivitamin-lutein (OCUVITE-LUTEIN) CAPS capsule Take 1 capsule by mouth daily.   Yes [provider]  oxyCODONE-acetaminophen (PERCOCET) 7.5-325 MG tablet Take 0.5 tablets by mouth at bedtime as needed for pain. 06/12/16  Yes [provider]  pravastatin (PRAVACHOL) 40 MG tablet Take 40 mg by mouth at bedtime.     Yes [provider]  nitroGLYCERIN (NITROSTAT) 0.4 MG SL tablet Place 1 tablet (0.4 mg total) under the tongue every 5 (five) minutes as needed for chest pain. 06/01/14   Montine Circle, PA-C  potassium chloride (K-DUR) 10 MEQ tablet Take 1 tablet (10 mEq total) by mouth daily. Patient not taking: Reported on 10/08/2017 08/29/16   Evans Lance, MD   Allergies  Allergen Reactions  . Tape Other (See Comments)    PATIENT'S SKIN IS VERY THIN AND  WILL BRUISE AND TEAR EASILY!!   Review of Systems  Sleepy.  Denies complaints  Physical Exam  Nursing notes and imaging reviewed. General: Sleepy but arousable.  Does not participate in conversation she continues  to fall back asleep.  HEENT: No bruits, no goiter, no JVD Heart: Regular rate and rhythm. No murmur appreciated. Lungs: Diminished air movement, clear Abdomen: Soft, nontender, nondistended, positive bowel sounds.  Ext: No significant edema Skin: Warm and dry  Vital Signs: BP (!) 147/67 (BP Location: Left Arm)   Pulse 62   Temp 99.7 F (37.6 C) (Oral)   Resp 20   Ht _0  (1.575 m)   Wt 61.7 kg (136 lb 0.4 oz)   SpO2 100%   BMI 24.88 kg/m  Pain Scale: PAINAD   Pain Score: Asleep   SpO2: SpO2: 100 % O2 Device:SpO2: 100 % O2 Flow Rate: .O2 Flow Rate (L/min): 2 L/min  IO: Intake/output summary:   Intake/Output Summary (Last 24 hours) at 10/11/2017 0813 Last data filed at 10/11/2017 0700 Gross per 24 hour  Intake 1188.33 ml  Output 600 ml  Net 588.33 ml    LBM: Last BM Date: 10/08/17 Baseline Weight: Weight: 61.7 kg (136 lb 0.4 oz) Most recent weight: Weight: 61.7 kg (136 lb 0.4 oz)     Palliative Assessment/Data:   Flowsheet Rows     Most Recent Value  Intake Tab  Referral Department  Hospitalist  Unit at Time of Referral  Med/Surg Unit  Palliative Care Primary Diagnosis  Other (Comment) [Compression fx]  Clinical Assessment  Psychosocial & Spiritual Assessment  Palliative Care Outcomes      Time In: 1650 Time Out: 1810 Time Total: 80 Greater than 50%  of this time was spent counseling and coordinating care related to the above assessment and plan.  Signed by: Micheline Rough, MD   Please contact Palliative Medicine Team phone at [phone] for questions and concerns.  For individual provider: See Shea Evans

## 2017-10-12 LAB — CBC WITH DIFFERENTIAL/PLATELET
BASOS PCT: 0 %
Basophils Absolute: 0 10*3/uL (ref 0.0–0.1)
Basophils Absolute: 0 10*3/uL (ref 0.0–0.1)
Basophils Relative: 0 %
EOS ABS: 0.2 10*3/uL (ref 0.0–0.7)
EOS PCT: 2 %
Eosinophils Absolute: 0.2 10*3/uL (ref 0.0–0.7)
Eosinophils Relative: 3 %
HCT: 36.2 % (ref 36.0–46.0)
HEMATOCRIT: 34.5 % — AB (ref 36.0–46.0)
HEMOGLOBIN: 10.8 g/dL — AB (ref 12.0–15.0)
Hemoglobin: 11.4 g/dL — ABNORMAL LOW (ref 12.0–15.0)
LYMPHS ABS: 0.9 10*3/uL (ref 0.7–4.0)
LYMPHS ABS: 0.9 10*3/uL (ref 0.7–4.0)
Lymphocytes Relative: 12 %
Lymphocytes Relative: 10 %
MCH: 30.4 pg (ref 26.0–34.0)
MCH: 30.5 pg (ref 26.0–34.0)
MCHC: 31.3 g/dL (ref 30.0–36.0)
MCHC: 31.5 g/dL (ref 30.0–36.0)
MCV: 97.2 fL (ref 78.0–100.0)
MCV: 96.8 fL (ref 78.0–100.0)
MONO ABS: 1.3 10*3/uL — AB (ref 0.1–1.0)
MONOS PCT: 17 %
MONOS PCT: 15 %
Monocytes Absolute: 1.3 10*3/uL — ABNORMAL HIGH (ref 0.1–1.0)
NEUTROS ABS: 5.3 10*3/uL (ref 1.7–7.7)
NEUTROS PCT: 68 %
Neutro Abs: 6.4 10*3/uL (ref 1.7–7.7)
Neutrophils Relative %: 73 %
PLATELETS: 250 10*3/uL (ref 150–400)
Platelets: 235 10*3/uL (ref 150–400)
RBC: 3.55 MIL/uL — ABNORMAL LOW (ref 3.87–5.11)
RBC: 3.74 MIL/uL — ABNORMAL LOW (ref 3.87–5.11)
RDW: 14.3 % (ref 11.5–15.5)
RDW: 14.3 % (ref 11.5–15.5)
WBC: 7.8 10*3/uL (ref 4.0–10.5)
WBC: 8.7 10*3/uL (ref 4.0–10.5)

## 2017-10-12 LAB — PHOSPHORUS: Phosphorus: 2.6 mg/dL (ref 2.5–4.6)

## 2017-10-12 LAB — COMPREHENSIVE METABOLIC PANEL
ALBUMIN: 2.2 g/dL — AB (ref 3.5–5.0)
ALK PHOS: 86 U/L (ref 38–126)
ALT: 11 U/L — AB (ref 14–54)
ANION GAP: 8 (ref 5–15)
AST: 16 U/L (ref 15–41)
BILIRUBIN TOTAL: 0.5 mg/dL (ref 0.3–1.2)
BUN: 29 mg/dL — ABNORMAL HIGH (ref 6–20)
CALCIUM: 7.8 mg/dL — AB (ref 8.9–10.3)
CO2: 24 mmol/L (ref 22–32)
CREATININE: 0.82 mg/dL (ref 0.44–1.00)
Chloride: 105 mmol/L (ref 101–111)
GFR calc Af Amer: >60 mL/min (ref >60)
GFR calc non Af Amer: 58 mL/min — ABNORMAL LOW (ref >60)
GLUCOSE: 94 mg/dL (ref 65–99)
Potassium: 4.1 mmol/L (ref 3.5–5.1)
Sodium: 137 mmol/L (ref 135–145)
TOTAL PROTEIN: 5 g/dL — AB (ref 6.5–8.1)

## 2017-10-12 LAB — MAGNESIUM: Magnesium: 1.8 mg/dL (ref 1.7–2.4)

## 2017-10-12 LAB — URINE CULTURE: Culture: <10000 — AB

## 2017-10-12 MED ORDER — BISACODYL 10 MG RE SUPP: 10.0000 mg | Freq: Every day | RECTAL | Status: DC | PRN

## 2017-10-12 MED ORDER — POLYETHYLENE GLYCOL 3350 17 G PO PACK
17.0000 g | PACK | Freq: Two times a day (BID) | ORAL | Status: DC
  Administered 2017-10-12 – 2017-10-14 (×4): 17 g via ORAL
  Filled 2017-10-12 (×4): qty 1

## 2017-10-12 NOTE — Progress Notes (Signed)
OT Cancellation Note  Patient Details Name: [name] MRN: [id number] DOB: June 27, 1918   Cancelled Treatment:    Noted pt to potentially have kyphoplasty. Discussed pt with Dr Neale Burly. Will check on pt next day. Lise Auer, [place] [phone]  Einar Crow D 10/12/2017, 10:07 AM

## 2017-10-12 NOTE — Plan of Care (Signed)
  Problem: Health Behavior/Discharge Planning: Goal: Ability to manage health-related needs will improve Outcome: Progressing   Problem: Clinical Measurements: Goal: Diagnostic test results will improve Outcome: Progressing Note:  Await bone scan 4/29   Problem: Activity: Goal: Risk for activity intolerance will decrease Outcome: Progressing   Problem: Elimination: Goal: Will not experience complications related to bowel motility Outcome: Progressing   Problem: Safety: Goal: Ability to remain free from injury will improve Outcome: Progressing   Problem: Skin Integrity: Goal: Risk for impaired skin integrity will decrease Outcome: Progressing   Problem: Spiritual Needs Goal: Ability to function at adequate level Outcome: Progressing

## 2017-10-12 NOTE — Progress Notes (Signed)
Daily Progress Note   Patient Name: [name]       Date: 10/12/2017 DOB: Mar 03, 1919  Age: 84 y.o. MRN#: [id number] Attending Physician: Merlene Laughter, DO Primary Care Physician: Wilmer Floor., MD Admit Date: 10/08/2017  Reason for Consultation/Follow-up: Establishing goals of care and Pain control  Subjective: I checked in on [name] today.  She reports doing "OK" and denies pain at this time.  Her daughter is at bedside.  We discussed again concerns and hopes regarding compression fracture.  Her daughter remains very hopeful she will be able to have kyphoplasty completed.    She now feels that her mother may benefit from rehab on discharge.  She asked to set up a meeting to discuss this with her brothers.  Length of Stay: 4  Current Medications: Scheduled Meds:  . acetaminophen  650 mg Oral Once  . calcitonin (salmon)  1 spray Alternating Nares Daily  . citalopram  20 mg Oral Daily  . levothyroxine  112 mcg Oral QAC breakfast  . methocarbamol  500 mg Oral TID  . metoprolol tartrate  6.25 mg Oral Daily   And  . metoprolol tartrate  12.5 mg Oral QHS  . polyethylene glycol  17 g Oral BID  . pravastatin  40 mg Oral QHS  . sennosides  10 mL Oral BID    Continuous Infusions: . cefTRIAXone (ROCEPHIN)  IV Stopped (10/11/17 1811)    PRN Meds: acetaminophen **OR** acetaminophen, bisacodyl, hydrALAZINE, ondansetron **OR** ondansetron (ZOFRAN) IV, oxyCODONE-acetaminophen  Physical Exam       General: More awake today, but still does not participate in conversation.  HEENT: No bruits, no goiter, no JVD Heart: Regular rate and rhythm. No murmur appreciated. Lungs: Diminished air movement, clear Abdomen: Soft, nontender, nondistended, positive bowel sounds.   Ext: No significant edema Skin: Warm and dry  Vital Signs: BP (!) 181/63 (BP Location: Left Arm)   Pulse 62   Temp 98.1 F (36.7 C) (Oral)   Resp 16   Ht  (1.575 m)   Wt 61.7 kg (136 lb 0.4 oz)   SpO2 100%   BMI 24.88 kg/m  SpO2: SpO2: 100 % O2 Device: O2 Device: Nasal Cannula O2 Flow Rate: O2 Flow Rate (L/min): 2 L/min  Intake/output summary:   Intake/Output Summary (Last 24 hours)  at 10/12/2017 1532 Last data filed at 10/12/2017 1525 Gross per 24 hour  Intake 1640.84 ml  Output 325 ml  Net 1315.84 ml   LBM: Last BM Date: 10/07/17(given loc) Baseline Weight: Weight: 61.7 kg (136 lb 0.4 oz) Most recent weight: Weight: 61.7 kg (136 lb 0.4 oz)       Palliative Assessment/Data:    Flowsheet Rows     Most Recent Value  Intake Tab  Referral Department  Hospitalist  Unit at Time of Referral  Med/Surg Unit  Palliative Care Primary Diagnosis  Other (Comment) [Compression fx]  Clinical Assessment  Psychosocial & Spiritual Assessment  Palliative Care Outcomes      Patient Active Problem List   Diagnosis Date Noted  . Compression fracture of L3 lumbar vertebra, closed, initial encounter (HCC) 10/08/2017  . Acute on chronic combined CHF:  (08/28/2016) EF 60-65% 08/28/2016  . Pacemaker at end of battery life: pacemaker generator change on 08/28/2016 08/26/2016  . Acute diastolic heart failure (HCC) 08/26/2016  . UTI (lower urinary tract infection) 02/28/2015  . NSTEMI (non-ST elevated myocardial infarction) (HCC) 02/28/2015  . SOB (shortness of breath) 02/28/2015  . Hypokalemia 02/28/2015  . Chronic diastolic CHF (congestive heart failure) (HCC) 06/13/2014  . Chest discomfort 06/01/2014  . Dementia 06/01/2014  . Sinus node dysfunction s/p Medtronic pacemaker 2008 06/01/2014  . Delirium 04/27/2013  . H/O: CVA (cerebrovascular accident) 04/27/2013  . Hypothyroidism 02/24/2009  . HYPERCHOLESTEROLEMIA 02/24/2009  . Iron deficiency anemia 02/24/2009  . Essential  hypertension 02/24/2009  . BRADYCARDIA 02/24/2009  . OSTEOARTHRITIS 02/24/2009  . DIZZINESS 02/24/2009  . PACEMAKER, PERMANENT 02/24/2009    Palliative Care Assessment & Plan   Patient Profile: 84 y.o. female  with past medical history of mention, sinus node dysfunction status post pacemaker, hypertension, hypothyroidism, CVA, heart failure, and iron deficiency anemia admitted on 10/08/2017 with pain after fall and found to have compression fracture of lumbar spine.  Await results of bone scan to see about potential for IR to perform kyphoplasty.  Palliative consulted for goals of care and pain management  Assessment: Patient Active Problem List   Diagnosis Date Noted  . Compression fracture of L3 lumbar vertebra, closed, initial encounter (HCC) 10/08/2017  . Acute on chronic combined CHF:  (08/28/2016) EF 60-65% 08/28/2016  . Pacemaker at end of battery life: pacemaker generator change on 08/28/2016 08/26/2016  . Acute diastolic heart failure (HCC) 08/26/2016  . UTI (lower urinary tract infection) 02/28/2015  . NSTEMI (non-ST elevated myocardial infarction) (HCC) 02/28/2015  . SOB (shortness of breath) 02/28/2015  . Hypokalemia 02/28/2015  . Chronic diastolic CHF (congestive heart failure) (HCC) 06/13/2014  . Chest discomfort 06/01/2014  . Dementia 06/01/2014  . Sinus node dysfunction s/p Medtronic pacemaker 2008 06/01/2014  . Delirium 04/27/2013  . H/O: CVA (cerebrovascular accident) 04/27/2013  . Hypothyroidism 02/24/2009  . HYPERCHOLESTEROLEMIA 02/24/2009  . Iron deficiency anemia 02/24/2009  . Essential hypertension 02/24/2009  . BRADYCARDIA 02/24/2009  . OSTEOARTHRITIS 02/24/2009  . DIZZINESS 02/24/2009  . PACEMAKER, PERMANENT 02/24/2009    Recommendations/Plan: Pain: Patient reports well-controlled today.  Continue same regimen.  Await results of bone scan and IR evaluation to determine if she may benefit from kyphoplasty.  Her daughter requested to meet in conjunction  with her brothers.   - We have set up a meeting for 5PM tomorrow to discuss long term goals whe  She leaves the hospital.  Code Status:    Code Status Orders  (From admission, onward)  Start     Ordered   10/09/17 0020  Do not attempt resuscitation (DNR)  Continuous    Question Answer Comment  In the event of cardiac or respiratory ARREST Do not call a "code blue"   In the event of cardiac or respiratory ARREST Do not perform Intubation, CPR, defibrillation or ACLS   In the event of cardiac or respiratory ARREST Use medication by any route, position, wound care, and other measures to relive pain and suffering. May use oxygen, suction and manual treatment of airway obstruction as needed for comfort.      10/09/17 0019    Code Status History    Date Active Date Inactive Code Status Order ID Comments User Context   08/26/2016 2021 08/28/2016 2133 Full Code [id number]  Chrystie Nose, MD Inpatient   02/28/2015 1638 03/02/2015 1859 Full Code [id number]  Rodolph Bong, MD Inpatient   04/27/2013 1558 04/28/2013 2004 Full Code [id number]  Esperanza Sheets, MD Inpatient    Advance Directive Documentation     Most Recent Value  Type of Advance Directive  Healthcare Power of Attorney, Living will  Pre-existing out of facility DNR order (yellow form or pink MOST form)  -  "MOST" Form in Place?  -       Prognosis:   Unable to determine  Discharge Planning:  To Be Determined  Care plan was discussed with daughter and granddaughter  Thank you for allowing the Palliative Medicine Team to assist in the care of this patient.   Total Time 20 Prolonged Time Billed No      Greater than 50%  of this time was spent counseling and coordinating care related to the above assessment and plan.  Romie Minus, MD  Please contact Palliative Medicine Team phone at [phone] for questions and concerns.

## 2017-10-12 NOTE — Progress Notes (Signed)
PROGRESS NOTE    [name]  ZOX:[id number] DOB: 08-Apr-1919 DOA: 10/08/2017 PCP: Wilmer Floor., MD  Brief Narrative:  The patient is 84 yo hx dementia, PPM, HTN, combined CHF, iron def, anemia, CVA had mech fall 2 days prior to presentation and is now unable to walk due to pain in her back and going down her hips.  Family saw her fall.   IN ED: xrays showed compression fractures of the spine at L3/ L4.  BP's high in ED, over 200, treated IV hydralazine.   She is admitted for pain control and now IR is consulting for possible kyphoplasty or vertebroplasty.  She is getting a nuclear medicine bone scan and Palliative care is also going to evaluate for goals of care and symptom management.  Nuclear medicine bone scan still pending as well as IR consultation likely will not be done until tomorrow.  Family wants to proceed with recommendations from IR prior to discussing further goals of care with Palliative and are hoping that patient can get a Kyphoplasty done.   Assessment & Plan:   Active Problems:   Hypothyroidism   HYPERCHOLESTEROLEMIA   Essential hypertension   PACEMAKER, PERMANENT   H/O: CVA (cerebrovascular accident)   Dementia   Chronic diastolic CHF (congestive heart failure) (HCC)   Hypokalemia   Compression fracture of L3 lumbar vertebra, closed, initial encounter (HCC)  Fall/ with resultant Vertebral compression fracture: of L3/L4 Lumbar Vertebra -Family wants aggressive Measures and wants to see if she is a candidate for Kyphoplasty/Verteborplasty  -Interventional Radiology consulted for evaluation and prior to them seeing we will obtain a nuclear medicine bone scan to see acuity of fractures -Nuclear Medicine bone scan is still currently pending and unfortunately likely will not be done until Monday per nursing report; We will call IR in the morning. -Palliative care consulted for goals of care as well as symptom and pain management -Current pain management with  oxycodone 1 tablet p.o. every 6 as needed for moderate pain and by palliative and is been decreased to 5/325 rather than 7.5/325 -C/w Robaxin 5 mg 3 times daily  Suspected UTI -Urinalysis last night showed cloudy urine, with small blood in her urine, large leukocytes, nitrites, many bacteria, greater than 50 WBCs with few squamous cells. -Repeat urinalysis shows cloudy urine, small hemoglobin and urine, large leukocytes, negative nitrites, rare bacteria, as well as greater than 50 WBCs -Sent urine culture showed less than 10,000 colonies of insignificant growth. -Started empiric antibiotics with IV Rocephin will continue for 3 days total (Day 2/3) as culture was not able to be obtained prior to antibiotics being hung  Essential Hypertension -Blood pressure was severely elevated this afternoon and was 181/63 -Started IV Hydralazine 10 mg every 6 hours as needed for systolic blood pressure greater than 180 or diastolic blood pressure greater than 100 -C/w Home Metoprolol  -Held losartan at this time given patient's acute kidney injury  Acute Kidney Injury, improved -Improved -Avoid nephrotoxic medications and stop patient's home Losartan -Started gentle IV fluid hydration normal saline at 50 mL's per hour and BUN/Cr improved to 29/0.82 -We will discontinue IV fluids at this time -Continue to monitor and repeat CMP in a.m.  Chronic Diastolic CHF -Appears euvolemic now. -We will stop patient's IV fluid hydration. -Continue to monitor for signs and symptoms of volume overload.  Watch volume status very carefully  Dementia -Chronic -Expect some degree of sundowning while hospitalized -Placed on delirium precautions  Hypothyroidism -Continue with Levothyroxine 112 mcg  p.o. daily before breakfast  Hyperlipidemia -Continue with home pravastatin 40 m p.o. nightly  Hyperkalemia, improved -Improved.  She is potassium was 4.1 this morning -Continue to monitor and replete as  necessary -Repeat CMP in AM  Hypomagnesemia, improved -Patient magnesium this morning was 1.8 -Need to monitor and replete as necessary -Repeat mag level in a.m.  Permanent pacemaker -We will have Medtronic tech interrogate pacer -Will be done Monday 10/13/2017  Constipation -Added bisacodyl suppository 10 mg rectally daily as needed -Crease MiraLAX 17 g p.o. twice daily as well as continued Sennosides 10 mL's p.o. twice daily  DVT prophylaxis: SCDs Code Status: DO NOT RESUSCITATE Family Communication: Discussed with Daughter at bedside Disposition Plan: Remain Inpatient for current work-up and treatment  Consultants:   Interventional Radiology  Palliative Care Medicine    Procedures: None   Antimicrobials:  Anti-infectives (From admission, onward)   Start     Dose/Rate Route Frequency Ordered Stop   10/10/17 1200  cefTRIAXone (ROCEPHIN) 1 g in sodium chloride 0.9 % 100 mL IVPB     1 g 200 mL/hr over 30 Minutes Intravenous Every 24 hours 10/10/17 0934       Subjective: Seen and examined at bedside was pleasantly confused.  She is resting and denied any pain.  Patient felt like she needed to have a bowel movement.  No chest pain, shortness breath, lightheadedness, dizziness or any other complaints or concerns at this time.  Objective: Vitals:   10/11/17 1702 10/11/17 2028 10/12/17 0418 10/12/17 1318  BP: 126/80 135/60 (!) 158/71 (!) 181/63  Pulse: 69 68 62 62  Resp:  Temp:  98.9 F (37.2 C) 98.9 F (37.2 C) 98.1 F (36.7 C)  TempSrc:  Oral Oral Oral  SpO2: 96% 99% 100% 100%  Weight:      Height:        Intake/Output Summary (Last 24 hours) at 10/12/2017 1448 Last data filed at 10/12/2017 1610 Gross per 24 hour  Intake 1536.67 ml  Output 325 ml  Net 1211.67 ml   Filed Weights   10/09/17 0033  Weight: 61.7 kg (136 lb 0.4 oz)   Examination: Physical Exam:  Constitutional: Elderly thin, Caucasian female who is in no acute distress.  Appears  comfortable at this time however she remains pleasantly confused and unable to provide subjective history Eyes: There are anicteric.  Conjunctive are noninjected. ENMT: External ears and nose appear normal.  Patient extremely hard of hearing.  His murmurs are moist Neck: Supple with no appreciable JVD Respiratory: Diminished to auscultation bilaterally.  No appreciable wheezing, rales, rhonchi.  Unlabored breathing Cardiovascular: Regular rate and rhythm.  No appreciable murmurs, rubs, gallops.  No lower extremity edema Abdomen: Soft, nontender, nondistended.  Bowel sounds present x4 GU: Deferred Musculoskeletal: No contractures, no cyanosis.  No joint deformities noted Skin: Warm and dry.  No appreciable rashes or lesions are limited skin evaluation.  Has some bruising in the lower extremities Neurologic: Cranial nerves II through XII grossly intact with no appreciable focal deficits.  Romberg sign or cerebellar reflexes were not assessed Psychiatric: Impaired judgment and insight.  Patient remains pleasantly confused.  Had a normal mood and affect  Data Reviewed: I have personally reviewed following labs and imaging studies  CBC: Recent Labs  Lab 10/08/17 1629 10/09/17 0418 10/10/17 0811 10/11/17 0447 10/12/17 0535 10/12/17 1106  WBC 12.1* 12.5* 10.8* 10.6* 7.8 8.7  NEUTROABS 9.9*  --  8.6* 8.2* 5.3 6.4  HGB 12.9 11.6* 11.7*  11.2* 10.8* 11.4*  HCT 40.4 37.1 37.1 35.9* 34.5* 36.2  MCV 96.4 97.4 98.7 98.1 97.2 96.8  PLT 298 251 252 250 235 250   Basic Metabolic Panel: Recent Labs  Lab 10/08/17 1827 10/09/17 0418 10/10/17 0811 10/11/17 0447 10/12/17 0535  NA 142 137 137 138 137  K 3.1* 5.6* 5.1 4.4 4.1  CL 110 103 102 105 105  CO2 GLUCOSE 91 104* 96 108* 94  BUN 19 24* 30* 32* 29*  CREATININE 0.69 0.79 1.05* 0.82 0.82  CALCIUM 7.1* 8.5* 8.5* 8.2* 7.8*  MG 1.2* 1.6* 2.2 2.1 1.8  PHOS  --  3.4 3.3 2.8 2.6   GFR: Estimated Creatinine Clearance: 33.9  mL/min (by C-G formula based on SCr of 0.82 mg/dL). Liver Function Tests: Recent Labs  Lab 10/09/17 0418 10/10/17 0811 10/11/17 0447 10/12/17 0535  AST ALT 12* 9* 10* 11*  ALKPHOS 72 74 85 86  BILITOT 0.7 1.2 0.4 0.5  PROT 6.0* 5.6* 5.3* 5.0*  ALBUMIN 2.7* 2.5* 2.4* 2.2*   No results for input(s): LIPASE, AMYLASE in the last 168 hours. No results for input(s): AMMONIA in the last 168 hours. Coagulation Profile: No results for input(s): INR, PROTIME in the last 168 hours. Cardiac Enzymes: No results for input(s): CKTOTAL, CKMB, CKMBINDEX, TROPONINI in the last 168 hours. BNP (last 3 results) No results for input(s): PROBNP in the last 8760 hours. HbA1C: No results for input(s): HGBA1C in the last 72 hours. CBG: No results for input(s): GLUCAP in the last 168 hours. Lipid Profile: No results for input(s): CHOL, HDL, LDLCALC, TRIG, CHOLHDL, LDLDIRECT in the last 72 hours. Thyroid Function Tests: No results for input(s): TSH, T4TOTAL, FREET4, T3FREE, THYROIDAB in the last 72 hours. Anemia Panel: No results for input(s): VITAMINB12, FOLATE, FERRITIN, TIBC, IRON, RETICCTPCT in the last 72 hours. Sepsis Labs: No results for input(s): PROCALCITON, LATICACIDVEN in the last 168 hours.  Recent Results (from the past 240 hour(s))  Culture, Urine     Status: Abnormal   Collection Time: 10/10/17  6:30 PM  Result Value Ref Range Status   Specimen Description   Final    URINE, RANDOM Performed at [facility], [address], [place], [place] [address]    Special Requests   Final    NONE Performed at [facility], [address], [place], [place] [address]    Culture (A)  Final    <10,000 COLONIES/mL INSIGNIFICANT GROWTH Performed at [facility], [address], [place], [place] [address]    Report Status 10/12/2017 FINAL  Final     Radiology Studies: No results found. Scheduled Meds: . acetaminophen  650 mg Oral Once   . calcitonin (salmon)  1 spray Alternating Nares Daily  . citalopram  20 mg Oral Daily  . levothyroxine  112 mcg Oral QAC breakfast  . methocarbamol  500 mg Oral TID  . metoprolol tartrate  6.25 mg Oral Daily   And  . metoprolol tartrate  12.5 mg Oral QHS  . polyethylene glycol  17 g Oral BID  . pravastatin  40 mg Oral QHS  . sennosides  10 mL Oral BID   Continuous Infusions: . sodium chloride 50 mL/hr at 10/12/17 0436  . cefTRIAXone (ROCEPHIN)  IV Stopped (10/11/17 1811)    LOS: 4 days   Merlene Laughter, DO [facility] Pager [phone]  If 7PM-7AM, please contact night-coverage [email or web address] Password  [email or web address] 10/12/2017, 2:48 PM

## 2017-10-13 ENCOUNTER — Other Ambulatory Visit (HOSPITAL_COMMUNITY): Payer: Medicare Other

## 2017-10-13 ENCOUNTER — Inpatient Hospital Stay (HOSPITAL_COMMUNITY): Payer: Medicare Other

## 2017-10-13 ENCOUNTER — Other Ambulatory Visit: Payer: Self-pay | Admitting: Radiology

## 2017-10-13 DIAGNOSIS — Z7189 Other specified counseling: Secondary | ICD-10-CM

## 2017-10-13 LAB — CBC WITH DIFFERENTIAL/PLATELET
BASOS PCT: 0 %
Basophils Absolute: 0 10*3/uL (ref 0.0–0.1)
EOS ABS: 0.2 10*3/uL (ref 0.0–0.7)
Eosinophils Relative: 1 %
HCT: 38.2 % (ref 36.0–46.0)
HEMOGLOBIN: 12.2 g/dL (ref 12.0–15.0)
Lymphocytes Relative: 12 %
Lymphs Abs: 1.3 10*3/uL (ref 0.7–4.0)
MCH: 30.6 pg (ref 26.0–34.0)
MCHC: 31.9 g/dL (ref 30.0–36.0)
MCV: 95.7 fL (ref 78.0–100.0)
Monocytes Absolute: 1.4 10*3/uL — ABNORMAL HIGH (ref 0.1–1.0)
Monocytes Relative: 13 %
NEUTROS PCT: 74 %
Neutro Abs: 7.9 10*3/uL — ABNORMAL HIGH (ref 1.7–7.7)
PLATELETS: 318 10*3/uL (ref 150–400)
RBC: 3.99 MIL/uL (ref 3.87–5.11)
RDW: 14.2 % (ref 11.5–15.5)
WBC: 10.7 10*3/uL — ABNORMAL HIGH (ref 4.0–10.5)

## 2017-10-13 LAB — COMPREHENSIVE METABOLIC PANEL
ALBUMIN: 2.5 g/dL — AB (ref 3.5–5.0)
ALK PHOS: 108 U/L (ref 38–126)
ALT: 17 U/L (ref 14–54)
ANION GAP: 9 (ref 5–15)
AST: 30 U/L (ref 15–41)
BUN: 25 mg/dL — ABNORMAL HIGH (ref 6–20)
CALCIUM: 8.5 mg/dL — AB (ref 8.9–10.3)
CO2: 23 mmol/L (ref 22–32)
CREATININE: 0.77 mg/dL (ref 0.44–1.00)
Chloride: 105 mmol/L (ref 101–111)
GFR calc Af Amer: >60 mL/min (ref >60)
GFR calc non Af Amer: >60 mL/min (ref >60)
GLUCOSE: 170 mg/dL — AB (ref 65–99)
Potassium: 4 mmol/L (ref 3.5–5.1)
SODIUM: 137 mmol/L (ref 135–145)
Total Bilirubin: 0.6 mg/dL (ref 0.3–1.2)
Total Protein: 6 g/dL — ABNORMAL LOW (ref 6.5–8.1)

## 2017-10-13 LAB — PHOSPHORUS: Phosphorus: 2 mg/dL — ABNORMAL LOW (ref 2.5–4.6)

## 2017-10-13 LAB — MAGNESIUM: Magnesium: 1.8 mg/dL (ref 1.7–2.4)

## 2017-10-13 MED ORDER — LOSARTAN POTASSIUM 25 MG PO TABS
25.0000 mg | ORAL_TABLET | Freq: Every day | ORAL | Status: DC
  Administered 2017-10-13: 25 mg via ORAL
  Filled 2017-10-13: qty 1

## 2017-10-13 MED ORDER — TECHNETIUM TC 99M MEDRONATE IV KIT
21.0000 | PACK | Freq: Once | INTRAVENOUS | Status: AC | PRN
  Administered 2017-10-13: 21 via INTRAVENOUS

## 2017-10-13 MED ORDER — METHOCARBAMOL 500 MG PO TABS: 500.0000 mg | ORAL_TABLET | Freq: Three times a day (TID) | ORAL | Status: DC | PRN

## 2017-10-13 MED ORDER — SENNA 8.6 MG PO TABS
2.0000 | ORAL_TABLET | Freq: Two times a day (BID) | ORAL | Status: DC
  Administered 2017-10-13 – 2017-10-14 (×2): 17.2 mg via ORAL
  Filled 2017-10-13 (×2): qty 2

## 2017-10-13 MED ORDER — POTASSIUM PHOSPHATES 15 MMOLE/5ML IV SOLN
20.0000 mmol | Freq: Once | INTRAVENOUS | Status: AC
  Administered 2017-10-13: 20 mmol via INTRAVENOUS
  Filled 2017-10-13: qty 6.67

## 2017-10-13 NOTE — Care Management Note (Signed)
Case Management Note  Patient Details  Name: [name] MRN: [id number] Date of Birth: 12/01/1918  Subjective/Objective:From home. Has dtr. DNR. Palliative following. For bone scan. IR possible kyphoplasty.CSW following for possible SNF.                    Action/Plan:d/c SNF.   Expected Discharge Date:  (unknown)               Expected Discharge Plan:  Home w Home Health Services  In-House Referral:     Discharge planning Services  CM Consult  Post Acute Care Choice:    Choice offered to:     DME Arranged:    DME Agency:     HH Arranged:    HH Agency:     Status of Service:  In process, will continue to follow  If discussed at Long Length of Stay Meetings, dates discussed:    Additional Comments:  Lanier Clam, RN 10/13/2017, 12:13 PM

## 2017-10-13 NOTE — Evaluation (Signed)
Occupational Therapy Evaluation Patient Details Name: [name] MRN: [id number] DOB: 10/03/1918 Today's Date: 10/13/2017    History of Present Illness 84 yo female admitted with essential hypertension, L3/L4 comp fx on imaging (possibly chronic), pain after fall at home. Hx of L and R THAs, bradycardia, pacemaker, CHF, NSTEMI, dementia, falls.   Clinical Impression   Pt admitted s/p fall at home. Pt currently with functional limitations due to the deficits listed below (see OT Problem List).  Pt will benefit from skilled OT to increase their safety and independence with ADL and functional mobility for ADL to facilitate discharge to venue listed below.      Follow Up Recommendations  SNF    Equipment Recommendations  None recommended by OT    Recommendations for Other Services       Precautions / Restrictions Precautions Precautions: Fall Precaution Comments: monitor O2 sats; back pain with activity Restrictions Weight Bearing Restrictions: No      Mobility Bed Mobility Overal bed mobility: Needs Assistance Bed Mobility: Rolling;Sidelying to Sit;Sit to Supine Rolling: Min assist;+2 for physical assistance;+2 for safety/equipment Sidelying to sit: Mod assist;+2 for physical assistance;+2 for safety/equipment   Sit to supine: Mod assist;+2 for physical assistance;+2 for safety/equipment   General bed mobility comments: Assist for trunk and bil LEs. Multimodal cueing required. Increased time.   Transfers Overall transfer level: Needs assistance Equipment used: Rolling walker (2 wheeled) Transfers: Sit to/from [employer] Sit to Stand: Min assist;+2 physical assistance;+2 safety/equipment Stand pivot transfers: Mod assist;+2 physical assistance;+2 safety/equipment       General transfer comment: Assist to rise, stabilize, move R LE, weigthshift pt, control descent. Multimodal cues required. Increased time. Stand pivot x 2, bed<>bsc, with a RW. Pt  attempt to sit unsafely and prematurely. High fall risk.     Balance Overall balance assessment: Needs assistance         Standing balance support: Bilateral upper extremity supported Standing balance-Leahy Scale: Poor                             ADL either performed or assessed with clinical judgement   ADL Overall ADL's : Needs assistance/impaired Eating/Feeding: Moderate assistance;Sitting   Grooming: Moderate assistance;Sitting   Upper Body Bathing: Moderate assistance;Sitting   Lower Body Bathing: Total assistance;+2 for safety/equipment;Sit to/from stand;Cueing for sequencing;Cueing for safety   Upper Body Dressing : Moderate assistance;Sitting   Lower Body Dressing: +2 for physical assistance;+2 for safety/equipment;Total assistance   Toilet Transfer: Maximal assistance;+2 for safety/equipment;+2 for physical assistance;RW;BSC   Toileting- Clothing Manipulation and Hygiene: Sit to/from stand;+2 for safety/equipment;+2 for physical assistance;Cueing for safety;Cueing for sequencing                            Pertinent Vitals/Pain Pain Assessment: Faces Faces Pain Scale: Hurts even more Pain Location: back Pain Descriptors / Indicators: Grimacing Pain Intervention(s): Limited activity within patient's tolerance;Monitored during session;Repositioned     Hand Dominance     Extremity/Trunk Assessment Upper Extremity Assessment Upper Extremity Assessment: Generalized weakness           Communication Communication Communication: HOH   Cognition Arousal/Alertness: Awake/alert(drowsy) Behavior During Therapy: WFL for tasks assessed/performed Overall Cognitive Status: History of cognitive impairments - at baseline  Home Living Family/patient expects to be discharged to:: Private residence Living Arrangements: Children(3 children take turns staying with pt) Available Help  at Discharge: Family;Available 24 hours/day Type of Home: House       Home Layout: One level     Bathroom Shower/Tub: Tub/shower unit         Home Equipment: Environmental consultant - 2 wheels;Wheelchair - manual;Hospital bed;Shower seat;Bedside commode   Additional Comments: hospital bed is not functioning properly      Prior Functioning/Environment Level of Independence: Needs assistance  Gait / Transfers Assistance Needed: nonambulatory since recent fall. family has been assisting pt in/out of wheelchair ADL's / Homemaking Assistance Needed: Assist for bathing, dressing            OT Problem List: Decreased strength;Decreased activity tolerance;Impaired balance (sitting and/or standing);Decreased safety awareness;Decreased knowledge of use of DME or AE;Decreased knowledge of precautions      OT Treatment/Interventions: Self-care/ADL training;Patient/family education;DME and/or AE instruction    OT Goals(Current goals can be found in the care plan section)    OT Frequency: Min 2X/week    AM-PAC PT "6 Clicks" Daily Activity     Outcome Measure Help from another person eating meals?: A Little Help from another person taking care of personal grooming?: A Lot Help from another person toileting, which includes using toliet, bedpan, or urinal?: Total Help from another person bathing (including washing, rinsing, drying)?: A Lot Help from another person to put on and taking off regular upper body clothing?: A Lot Help from another person to put on and taking off regular lower body clothing?: Total 6 Click Score: 11   End of Session Equipment Utilized During Treatment: Rolling walker Nurse Communication: Mobility status  Activity Tolerance: Patient tolerated treatment well Patient left: in bed;with call bell/phone within reach;with family/visitor present;with bed alarm set  OT Visit Diagnosis: Unsteadiness on feet (R26.81);History of falling (Z91.81);Other abnormalities of gait and  mobility (R26.89);Muscle weakness (generalized) (M62.81)                Time: 5784-6962 OT Time Calculation (min): 30 min Charges:  OT General Charges $OT Visit: 1 Visit OT Evaluation $OT Eval Moderate Complexity: 1 Mod G-Codes:     Lise Auer, OT [phone]  Einar Crow D 10/13/2017, 1:09 PM

## 2017-10-13 NOTE — Progress Notes (Signed)
Daily Progress Note   Patient Name: [name]       Date: 10/13/2017 DOB: Jul 17, 1918  Age: 84 y.o. MRN#: [id number] Attending Physician: Merlene Laughter, DO Primary Care Physician: Wilmer Floor., MD Admit Date: 10/08/2017  Reason for Consultation/Follow-up: Establishing goals of care and Pain control  Subjective: I checked in on [name] today.  She reports doing "OK" and denies pain at this time.  Her daughter, 2 sons, and daughter in law are at bedside.  We reviewed clinical course this hospitalization.  Discussed that she is not a candidate for kyphoplasty.  Reviewed PT/OT notes and recs with family.  We discussed long term goal of returning home and options on how to best proceed with this including trial of rehab, home with home health, and home with hospice.  See below.  Length of Stay: 5  Current Medications: Scheduled Meds:  . acetaminophen  650 mg Oral Once  . calcitonin (salmon)  1 spray Alternating Nares Daily  . citalopram  20 mg Oral Daily  . levothyroxine  112 mcg Oral QAC breakfast  . losartan  25 mg Oral QHS  . metoprolol tartrate  6.25 mg Oral Daily   And  . metoprolol tartrate  12.5 mg Oral QHS  . polyethylene glycol  17 g Oral BID  . pravastatin  40 mg Oral QHS  . senna  2 tablet Oral BID    Continuous Infusions: . cefTRIAXone (ROCEPHIN)  IV 1 g (10/13/17 1812)    PRN Meds: acetaminophen **OR** acetaminophen, bisacodyl, hydrALAZINE, methocarbamol, ondansetron **OR** ondansetron (ZOFRAN) IV, oxyCODONE-acetaminophen  Physical Exam       General: Awake and alert today, but still does not participate in conversation.  HEENT: No bruits, no goiter, no JVD Heart: Regular rate and rhythm. No murmur appreciated. Lungs: Diminished air  movement, clear Abdomen: Soft, nontender, nondistended, positive bowel sounds.  Ext: No significant edema Skin: Warm and dry  Vital Signs: BP (!) 180/82 (BP Location: Left Arm)   Pulse 67   Temp 98.5 F (36.9 C) (Oral)   Resp 18   Ht  (1.575 m)   Wt 61.7 kg (136 lb 0.4 oz)   SpO2 99%   BMI 24.88 kg/m  SpO2: SpO2: 99 % O2 Device: O2 Device: Nasal Cannula O2 Flow Rate: O2 Flow  Rate (L/min): 2 L/min  Intake/output summary:   Intake/Output Summary (Last 24 hours) at 10/13/2017 2213 Last data filed at 10/13/2017 1500 Gross per 24 hour  Intake 600 ml  Output 650 ml  Net -50 ml   LBM: Last BM Date: 10/12/17 Baseline Weight: Weight: 61.7 kg (136 lb 0.4 oz) Most recent weight: Weight: 61.7 kg (136 lb 0.4 oz)       Palliative Assessment/Data:    Flowsheet Rows     Most Recent Value  Intake Tab  Referral Department  Hospitalist  Unit at Time of Referral  Med/Surg Unit  Palliative Care Primary Diagnosis  Other (Comment) [Compression fx]  Clinical Assessment  Psychosocial & Spiritual Assessment  Palliative Care Outcomes      Patient Active Problem List   Diagnosis Date Noted  . Compression fracture of L3 lumbar vertebra, closed, initial encounter (HCC) 10/08/2017  . Acute on chronic combined CHF:  (08/28/2016) EF 60-65% 08/28/2016  . Pacemaker at end of battery life: pacemaker generator change on 08/28/2016 08/26/2016  . Acute diastolic heart failure (HCC) 08/26/2016  . UTI (lower urinary tract infection) 02/28/2015  . NSTEMI (non-ST elevated myocardial infarction) (HCC) 02/28/2015  . SOB (shortness of breath) 02/28/2015  . Hypokalemia 02/28/2015  . Chronic diastolic CHF (congestive heart failure) (HCC) 06/13/2014  . Chest discomfort 06/01/2014  . Dementia 06/01/2014  . Sinus node dysfunction s/p [employer]onic pacemaker 2008 06/01/2014  . Delirium 04/27/2013  . H/O: CVA (cerebrovascular accident) 04/27/2013  . Hypothyroidism 02/24/2009  . HYPERCHOLESTEROLEMIA  02/24/2009  . Iron deficiency anemia 02/24/2009  . Essential hypertension 02/24/2009  . BRADYCARDIA 02/24/2009  . OSTEOARTHRITIS 02/24/2009  . DIZZINESS 02/24/2009  . PACEMAKER, PERMANENT 02/24/2009    Palliative Care Assessment & Plan   Patient Profile: 84 y.o. female  with past medical history of mention, sinus node dysfunction status post pacemaker, hypertension, hypothyroidism, CVA, heart failure, and iron deficiency anemia admitted on 10/08/2017 with pain after fall and found to have compression fracture of lumbar spine.  Await results of bone scan to see about potential for IR to perform kyphoplasty.  Palliative consulted for goals of care and pain management  Assessment: Patient Active Problem List   Diagnosis Date Noted  . Compression fracture of L3 lumbar vertebra, closed, initial encounter (HCC) 10/08/2017  . Acute on chronic combined CHF:  (08/28/2016) EF 60-65% 08/28/2016  . Pacemaker at end of battery life: pacemaker generator change on 08/28/2016 08/26/2016  . Acute diastolic heart failure (HCC) 08/26/2016  . UTI (lower urinary tract infection) 02/28/2015  . NSTEMI (non-ST elevated myocardial infarction) (HCC) 02/28/2015  . SOB (shortness of breath) 02/28/2015  . Hypokalemia 02/28/2015  . Chronic diastolic CHF (congestive heart failure) (HCC) 06/13/2014  . Chest discomfort 06/01/2014  . Dementia 06/01/2014  . Sinus node dysfunction s/p Medtronic pacemaker 2008 06/01/2014  . Delirium 04/27/2013  . H/O: CVA (cerebrovascular accident) 04/27/2013  . Hypothyroidism 02/24/2009  . HYPERCHOLESTEROLEMIA 02/24/2009  . Iron deficiency anemia 02/24/2009  . Essential hypertension 02/24/2009  . BRADYCARDIA 02/24/2009  . OSTEOARTHRITIS 02/24/2009  . DIZZINESS 02/24/2009  . PACEMAKER, PERMANENT 02/24/2009    Recommendations/Plan: Pain: Patient reports well-controlled today.  Family is concerned that she is still somewhat sleepy.  She has used one dose of Percocet last 24 hours.   We will plan to discontinue scheduled Robaxin and changed to as needed.  We discussed options for leaving the hospital.  Family would like to pursue trial of rehab.  Preference would be for [facility] [place]  as first choice and [facility] in [place] for secondary.  I recommend palliative care continue to follow skilled facility and guide further decision making based upon clinical course of the next 2 weeks.  Code Status:    Code Status Orders  (From admission, onward)        Start     Ordered   10/09/17 0020  Do not attempt resuscitation (DNR)  Continuous    Question Answer Comment  In the event of cardiac or respiratory ARREST Do not call a "code blue"   In the event of cardiac or respiratory ARREST Do not perform Intubation, CPR, defibrillation or ACLS   In the event of cardiac or respiratory ARREST Use medication by any route, position, wound care, and other measures to relive pain and suffering. May use oxygen, suction and manual treatment of airway obstruction as needed for comfort.      10/09/17 0019    Code Status History    Date Active Date Inactive Code Status Order ID Comments User Context   08/26/2016 2021 08/28/2016 2133 Full Code [id number]  Chrystie Nose, MD Inpatient   02/28/2015 1638 03/02/2015 1859 Full Code [id number]  Rodolph Bong, MD Inpatient   04/27/2013 1558 04/28/2013 2004 Full Code [id number]  Esperanza Sheets, MD Inpatient    Advance Directive Documentation     Most Recent Value  Type of Advance Directive  Healthcare Power of Attorney, Living will  Pre-existing out of facility DNR order (yellow form or pink MOST form)  -  "MOST" Form in Place?  -       Prognosis:   Unable to determine  Discharge Planning:  To Be Determined  Care plan was discussed with daughter, 2 sons, DIL  Thank you for allowing the Palliative Medicine Team to assist in the care of this patient.   Total Time 50 Prolonged Time Billed No      Greater than 50%  of  this time was spent counseling and coordinating care related to the above assessment and plan.  Romie Minus, MD  Please contact Palliative Medicine Team phone at [phone] for questions and concerns.

## 2017-10-13 NOTE — Progress Notes (Addendum)
Patient ID: [name], female   DOB: 1918/11/29, 84 y.o.   MRN: [id number] Pt's bone scan today negative for acute fracture, therefore she is not candidate for VP/KP. Images were reviewed by Dr. Archer Asa. Above d/w pt's family.

## 2017-10-13 NOTE — Progress Notes (Signed)
Physical Therapy Treatment Patient Details Name: [name] MRN: [id number] DOB: 1919-05-26 Today's Date: 10/13/2017    History of Present Illness 84 yo female admitted with essential hypertension, L3/L4 comp fx on imaging (possibly chronic), pain after fall at home. Hx of L and R THAs, bradycardia, pacemaker, CHF, NSTEMI, dementia, falls.    PT Comments    Progressing slowly with mobility. Pt was more participatory but still drowsy. She did not c/o back pain until towards end of session. When asked if her back hurt, she stated "a little". Family is now considering rehab placement. They are still awaiting recs/decision about possible need for kyphoplasty. Will continue to follow and progress activity as pt is able to tolerate.   Follow Up Recommendations  SNF     Equipment Recommendations  Hospital bed    Recommendations for Other Services       Precautions / Restrictions Precautions Precautions: Fall Precaution Comments: monitor O2 sats; back pain with activity Restrictions Weight Bearing Restrictions: No    Mobility  Bed Mobility Overal bed mobility: Needs Assistance Bed Mobility: Rolling;Sidelying to Sit;Sit to Supine Rolling: Min assist;+2 for physical assistance;+2 for safety/equipment Sidelying to sit: Mod assist;+2 for physical assistance;+2 for safety/equipment   Sit to supine: Mod assist;+2 for physical assistance;+2 for safety/equipment   General bed mobility comments: Assist for trunk and bil LEs. Multimodal cueing required. Increased time.   Transfers Overall transfer level: Needs assistance Equipment used: Rolling walker (2 wheeled) Transfers: Sit to/from [employer] Sit to Stand: Min assist;+2 physical assistance;+2 safety/equipment Stand pivot transfers: Mod assist;+2 physical assistance;+2 safety/equipment       General transfer comment: Assist to rise, stabilize, move R LE, weigthshift pt, control descent. Multimodal cues  required. Increased time. Stand pivot x 2, bed<>bsc, with a RW. Pt attempt to sit unsafely and prematurely. High fall risk.   Ambulation/Gait             General Gait Details: NT   Stairs             Wheelchair Mobility    Modified Rankin (Stroke Patients Only)       Balance Overall balance assessment: Needs assistance         Standing balance support: Bilateral upper extremity supported Standing balance-Leahy Scale: Poor                              Cognition Arousal/Alertness: Awake/alert(drowsy) Behavior During Therapy: WFL for tasks assessed/performed Overall Cognitive Status: History of cognitive impairments - at baseline                                        Exercises      General Comments        Pertinent Vitals/Pain Pain Assessment: Faces Faces Pain Scale: Hurts even more Pain Location: back Pain Descriptors / Indicators: Grimacing Pain Intervention(s): Limited activity within patient's tolerance;Monitored during session;Repositioned    Home Living                      Prior Function            PT Goals (current goals can now be found in the care plan section) Progress towards PT goals: Progressing toward goals    Frequency    Min 2X/week      PT Plan Current  plan remains appropriate    Co-evaluation              AM-PAC PT "6 Clicks" Daily Activity  Outcome Measure  Difficulty turning over in bed (including adjusting bedclothes, sheets and blankets)?: Unable Difficulty moving from lying on back to sitting on the side of the bed? : Unable Difficulty sitting down on and standing up from a chair with arms (e.g., wheelchair, bedside commode, etc,.)?: Unable Help needed moving to and from a bed to chair (including a wheelchair)?: Total Help needed walking in hospital room?: Total Help needed climbing 3-5 steps with a railing? : Total 6 Click Score: 6    End of Session   Activity  Tolerance: Patient limited by pain Patient left: in bed;with call bell/phone within reach;with bed alarm set;with family/visitor present   PT Visit Diagnosis: Muscle weakness (generalized) (M62.81);Difficulty in walking, not elsewhere classified (R26.2);Pain Pain - part of body: (back)     Time: 1610-9604 PT Time Calculation (min) (ACUTE ONLY): 30 min  Charges:  $Therapeutic Activity: 8-22 mins                    G Codes:         Rebeca Alert, MPT Pager: [phone]

## 2017-10-13 NOTE — Care Management Important Message (Signed)
Important Message  Patient Details  Name: [name] MRN: [id number] Date of Birth: 01-Aug-1918   Medicare Important Message Given:  Yes    Caren Macadam 10/13/2017, 11:12 AMImportant Message  Patient Details  Name: [name] MRN: [id number] Date of Birth: 1918-10-15   Medicare Important Message Given:  Yes    Caren Macadam 10/13/2017, 11:12 AM

## 2017-10-13 NOTE — Progress Notes (Signed)
PROGRESS NOTE    [name]  ZOX:[id number] DOB: Oct 19, 1918 DOA: 10/08/2017 PCP: Wilmer Floor., MD  Brief Narrative:  The patient is 84 yo hx dementia, PPM, HTN, combined CHF, iron def, anemia, CVA had mech fall 2 days prior to presentation and is now unable to walk due to pain in her back and going down her hips.  Family saw her fall.   IN ED: xrays showed compression fractures of the spine at L3/ L4.  BP's high in ED, over 200, treated IV hydralazine.   She was admitted for pain control and now IR is consulting for possible kyphoplasty or vertebroplasty.  She is getting a nuclear medicine bone scan today and Palliative care is also going to evaluate for goals of care and symptom management.  Nuclear medicine bone scan currently being done right now and IR consultation afterwards.  Family wants to proceed with recommendations from IR prior to discussing further goals of care with Palliative and are hoping that patient can get a Kyphoplasty done.   Assessment & Plan:   Active Problems:   Hypothyroidism   HYPERCHOLESTEROLEMIA   Essential hypertension   PACEMAKER, PERMANENT   H/O: CVA (cerebrovascular accident)   Dementia   Chronic diastolic CHF (congestive heart failure) (HCC)   Hypokalemia   Compression fracture of L3 lumbar vertebra, closed, initial encounter (HCC)  Fall/ with resultant Vertebral compression fracture: of L3/L4 Lumbar Vertebra -Family wants aggressive Measures and wants to see if she is a candidate for Kyphoplasty/Verteborplasty  -Interventional Radiology consulted for evaluation and prior to them seeing we will obtain a nuclear medicine bone scan to see acuity of fractures; Interventional Radiology to evaluate after her nuclear medicine bone scan -Nuclear Medicine bone scan currently being done right now -Palliative care consulted for goals of care as well as symptom and pain management -Current pain management with Oxycodone 1 tablet p.o. every 6 as  needed for moderate pain and by palliative and is been decreased to 5/325 rather than 7.5/325 -C/w Robaxin 5 mg 3 times daily  Suspected UTI -Urinalysis last night showed cloudy urine, with small blood in her urine, large leukocytes, nitrites, many bacteria, greater than 50 WBCs with few squamous cells. -Repeat urinalysis shows cloudy urine, small hemoglobin and urine, large leukocytes, negative nitrites, rare bacteria, as well as greater than 50 WBCs -Sent urine culture showed less than 10,000 colonies of insignificant growth. -Started empiric antibiotics with IV Rocephin will continue for 3 days total (Day 3/3) as culture was not able to be obtained prior to antibiotics being hung  Essential Hypertension -Blood pressure was severely elevated this AM and was 223/80 -Started IV Hydralazine 10 mg every 6 hours as needed for systolic blood pressure greater than 180 or diastolic blood pressure greater than 100 -C/w Home Metoprolol dosing and may need to increase -Held Losartan 25 mg Daily given patient's acute kidney injury will restart now that AKI is improved -Continue to trend blood pressures  Acute Kidney Injury, improved -Improved -Avoid nephrotoxic medications but resume home losartan -Initially started gentle IV fluid hydration normal saline at 50 mL's per hour and BUN/Cr improved to 25/0.77 -IV fluids have been now discontinued -Continue to monitor and repeat CMP in a.m.  Chronic Diastolic CHF -Appears Euvolemic now. -We will stop patient's IV fluid hydration. -Continue with home Metoprolol as well as Losartan -Continue to monitor for signs and symptoms of volume overload.  Watch volume status very carefully  Dementia -Chronic -Expect some degree of sundowning while hospitalized -  Placed on delirium precautions  Hypothyroidism -Continue with Levothyroxine 112 mcg p.o. daily before breakfast  Hyperlipidemia -Continue with Home Pravastatin 40 m p.o.  nightly  Hyperkalemia, improved -Improved.  She is potassium was 4.0 this morning -Continue to monitor and replete as necessary -Repeat CMP in AM  Hypomagnesemia, improved -Patient magnesium this morning was 1.8 -Continue to monitor and replete as necessary -Repeat mag level in a.m.  Hypophosphatemia -Patient's phosphorus level this a.m. was 2.0 -Replete with IV K-Phos 20 mmol -Continue to monitor and repeat as necessary -Repeat phosphorus level in the a.m.  Permanent pacemaker -We will have [employer] tech interrogate pacer -Will likely be done Monday 10/13/2017  Constipation -Added bisacodyl suppository 10 mg rectally daily as needed -Crease MiraLAX 17 g p.o. twice daily as well as continued Sennosides 10 mL's p.o. twice daily  Leukocytosis -Patient's WBC was slightly elevated 10.7 -Likely reactive secondary to pain -Continue to monitor sputum signs and symptoms of infection -Repeat CBC with differential in a.m.  DVT prophylaxis: SCDs Code Status: DO NOT RESUSCITATE Family Communication: Discussed with Daughter at bedside Disposition Plan: Remain Inpatient for current work-up and treatment potential SNF placement at discharge if family does not want her to go home with home health  Consultants:   Interventional Radiology  Palliative Care Medicine    Procedures: None   Antimicrobials:  Anti-infectives (From admission, onward)   Start     Dose/Rate Route Frequency Ordered Stop   10/10/17 1200  cefTRIAXone (ROCEPHIN) 1 g in sodium chloride 0.9 % 100 mL IVPB     1 g 200 mL/hr over 30 Minutes Intravenous Every 24 hours 10/10/17 0934 10/14/17 1759     Subjective: Seen and examined at bedside and is pleasantly confused.  Denied any active back pain.  Family states that she finally had a bowel movement.  No nausea, vomiting, shortness of breath, chest pain, lightheadedness or dizziness. Patient to go for nuclear med scan today.  Objective: Vitals:   10/13/17 0108  10/13/17 0639 10/13/17 0655 10/13/17 1339  BP:  (!) 223/80 (!) 204/84 (!) 180/82  Pulse:  65 64 67  Resp:  18    Temp: 99.1 F (37.3 C) 98.7 F (37.1 C)  98.5 F (36.9 C)  TempSrc: Oral Oral  Oral  SpO2:  100%  99%  Weight:      Height:        Intake/Output Summary (Last 24 hours) at 10/13/2017 1343 Last data filed at 10/13/2017 4098 Gross per 24 hour  Intake 1204.17 ml  Output 850 ml  Net 354.17 ml   Filed Weights   10/09/17 0033  Weight: 61.7 kg (136 lb 0.4 oz)   Examination: Physical Exam:  Constitutional: Elderly thin Caucasian female who is currently in no acute distress.  Appears comfortable however she is pleasantly confused and unable to provide subjective history Eyes: Anicteric sclera.  Conjunctive are noninjected ENMT: External ears and nose appear normal.  Patient is extremely hard of hearing.  Mucous members are moist Neck: Supple with no appreciable JVD Respiratory: Diminished to auscultation bilaterally.  No appreciable wheezing, rales, rhonchi.  Unlabored breathing Cardiovascular: Regular rate and rhythm.  No appreciable murmurs, rubs, gallops.  No lower extremity edema appreciated Abdomen: Soft, nontender, non-distended.  Bowel sounds present all 4 quadrants GU: Deferred Musculoskeletal: No contractures, no cyanosis.  No joint deformities noted Skin: Warm and dry and has some ecchymosis. Neurologic: Nerves II through XII grossly intact with no appreciable focal deficits.  Romberg sign or cerebellar reflexes  were not assessed Psychiatric: Impaired judgment and insight.  Patient remains pleasantly confused.  Has a normal mood affect  Data Reviewed: I have personally reviewed following labs and imaging studies  CBC: Recent Labs  Lab 10/10/17 0811 10/11/17 0447 10/12/17 0535 10/12/17 1106 10/13/17 0815  WBC 10.8* 10.6* 7.8 8.7 10.7*  NEUTROABS 8.6* 8.2* 5.3 6.4 7.9*  HGB 11.7* 11.2* 10.8* 11.4* 12.2  HCT 37.1 35.9* 34.5* 36.2 38.2  MCV 98.7 98.1 97.2  96.8 95.7  PLT 252 250 235 250 318   Basic Metabolic Panel: Recent Labs  Lab 10/09/17 0418 10/10/17 0811 10/11/17 0447 10/12/17 0535 10/13/17 0815  NA 137 137 138 137 137  K 5.6* 5.1 4.4 4.1 4.0  CL 103 102 105 105 105  CO2 GLUCOSE 104* 96 108* 94 170*  BUN 24* 30* 32* 29* 25*  CREATININE 0.79 1.05* 0.82 0.82 0.77  CALCIUM 8.5* 8.5* 8.2* 7.8* 8.5*  MG 1.6* 2.2 2.1 1.8 1.8  PHOS 3.4 3.3 2.8 2.6 2.0*   GFR: Estimated Creatinine Clearance: 34.7 mL/min (by C-G formula based on SCr of 0.77 mg/dL). Liver Function Tests: Recent Labs  Lab 10/09/17 0418 10/10/17 0811 10/11/17 0447 10/12/17 0535 10/13/17 0815  AST ALT 12* 9* 10* 11* 17  ALKPHOS 72 74 85 86 108  BILITOT 0.7 1.2 0.4 0.5 0.6  PROT 6.0* 5.6* 5.3* 5.0* 6.0*  ALBUMIN 2.7* 2.5* 2.4* 2.2* 2.5*   No results for input(s): LIPASE, AMYLASE in the last 168 hours. No results for input(s): AMMONIA in the last 168 hours. Coagulation Profile: No results for input(s): INR, PROTIME in the last 168 hours. Cardiac Enzymes: No results for input(s): CKTOTAL, CKMB, CKMBINDEX, TROPONINI in the last 168 hours. BNP (last 3 results) No results for input(s): PROBNP in the last 8760 hours. HbA1C: No results for input(s): HGBA1C in the last 72 hours. CBG: No results for input(s): GLUCAP in the last 168 hours. Lipid Profile: No results for input(s): CHOL, HDL, LDLCALC, TRIG, CHOLHDL, LDLDIRECT in the last 72 hours. Thyroid Function Tests: No results for input(s): TSH, T4TOTAL, FREET4, T3FREE, THYROIDAB in the last 72 hours. Anemia Panel: No results for input(s): VITAMINB12, FOLATE, FERRITIN, TIBC, IRON, RETICCTPCT in the last 72 hours. Sepsis Labs: No results for input(s): PROCALCITON, LATICACIDVEN in the last 168 hours.  Recent Results (from the past 240 hour(s))  Culture, Urine     Status: Abnormal   Collection Time: 10/10/17  6:30 PM  Result Value Ref Range Status   Specimen Description    Final    URINE, RANDOM Performed at [facility], [address], [place], [place] [address]    Special Requests   Final    NONE Performed at [facility], [address] [place], [place] [address]    Culture (A)  Final    <10,000 COLONIES/mL INSIGNIFICANT GROWTH Performed at [facility], [address], [place], [place] [address]    Report Status 10/12/2017 FINAL  Final     Radiology Studies: No results found. Scheduled Meds: . acetaminophen  650 mg Oral Once  . calcitonin (salmon)  1 spray Alternating Nares Daily  . citalopram  20 mg Oral Daily  . levothyroxine  112 mcg Oral QAC breakfast  . methocarbamol  500 mg Oral TID  . metoprolol tartrate  6.25 mg Oral Daily   And  . metoprolol tartrate  12.5 mg Oral QHS  .  polyethylene glycol  17 g Oral BID  . pravastatin  40 mg Oral QHS  . senna  2 tablet Oral BID   Continuous Infusions: . cefTRIAXone (ROCEPHIN)  IV Stopped (10/12/17 1739)  . potassium PHOSPHATE IVPB (mmol) 20 mmol (10/13/17 0955)    LOS: 5 days   Merlene Laughter, DO [facility] Pager [phone]  If 7PM-7AM, please contact night-coverage [email or web address] Password [facility] 10/13/2017, 1:43 PM

## 2017-10-14 DIAGNOSIS — F0281 Dementia in other diseases classified elsewhere with behavioral disturbance: Secondary | ICD-10-CM | POA: Diagnosis not present

## 2017-10-14 DIAGNOSIS — S32049D Unspecified fracture of fourth lumbar vertebra, subsequent encounter for fracture with routine healing: Secondary | ICD-10-CM | POA: Diagnosis not present

## 2017-10-14 DIAGNOSIS — W19XXXD Unspecified fall, subsequent encounter: Secondary | ICD-10-CM | POA: Diagnosis not present

## 2017-10-14 DIAGNOSIS — I10 Essential (primary) hypertension: Secondary | ICD-10-CM | POA: Diagnosis not present

## 2017-10-14 DIAGNOSIS — S32030A Wedge compression fracture of third lumbar vertebra, initial encounter for closed fracture: Secondary | ICD-10-CM | POA: Diagnosis not present

## 2017-10-14 DIAGNOSIS — R262 Difficulty in walking, not elsewhere classified: Secondary | ICD-10-CM | POA: Diagnosis not present

## 2017-10-14 DIAGNOSIS — N179 Acute kidney failure, unspecified: Secondary | ICD-10-CM | POA: Diagnosis not present

## 2017-10-14 DIAGNOSIS — I5032 Chronic diastolic (congestive) heart failure: Secondary | ICD-10-CM | POA: Diagnosis not present

## 2017-10-14 DIAGNOSIS — M25571 Pain in right ankle and joints of right foot: Secondary | ICD-10-CM | POA: Diagnosis not present

## 2017-10-14 DIAGNOSIS — N390 Urinary tract infection, site not specified: Secondary | ICD-10-CM | POA: Diagnosis not present

## 2017-10-14 DIAGNOSIS — G8911 Acute pain due to trauma: Secondary | ICD-10-CM | POA: Diagnosis not present

## 2017-10-14 DIAGNOSIS — Z950 Presence of cardiac pacemaker: Secondary | ICD-10-CM | POA: Diagnosis not present

## 2017-10-14 DIAGNOSIS — M549 Dorsalgia, unspecified: Secondary | ICD-10-CM | POA: Diagnosis not present

## 2017-10-14 LAB — CBC WITH DIFFERENTIAL/PLATELET
BASOS ABS: 0 10*3/uL (ref 0.0–0.1)
BASOS PCT: 0 %
Eosinophils Absolute: 0.4 10*3/uL (ref 0.0–0.7)
Eosinophils Relative: 4 %
HCT: 33.7 % — ABNORMAL LOW (ref 36.0–46.0)
HEMOGLOBIN: 10.8 g/dL — AB (ref 12.0–15.0)
Lymphocytes Relative: 16 %
Lymphs Abs: 1.3 10*3/uL (ref 0.7–4.0)
MCH: 30.3 pg (ref 26.0–34.0)
MCHC: 32 g/dL (ref 30.0–36.0)
MCV: 94.7 fL (ref 78.0–100.0)
Monocytes Absolute: 1.3 10*3/uL — ABNORMAL HIGH (ref 0.1–1.0)
Monocytes Relative: 16 %
NEUTROS PCT: 64 %
Neutro Abs: 5.4 10*3/uL (ref 1.7–7.7)
Platelets: 304 10*3/uL (ref 150–400)
RBC: 3.56 MIL/uL — AB (ref 3.87–5.11)
RDW: 14 % (ref 11.5–15.5)
WBC: 8.4 10*3/uL (ref 4.0–10.5)

## 2017-10-14 LAB — COMPREHENSIVE METABOLIC PANEL
ALBUMIN: 2.1 g/dL — AB (ref 3.5–5.0)
ALK PHOS: 98 U/L (ref 38–126)
ALT: 20 U/L (ref 14–54)
AST: 34 U/L (ref 15–41)
Anion gap: 8 (ref 5–15)
BUN: 21 mg/dL — AB (ref 6–20)
CHLORIDE: 101 mmol/L (ref 101–111)
CO2: 24 mmol/L (ref 22–32)
Calcium: 8.1 mg/dL — ABNORMAL LOW (ref 8.9–10.3)
Creatinine, Ser: 0.64 mg/dL (ref 0.44–1.00)
GFR calc non Af Amer: >60 mL/min (ref >60)
GLUCOSE: 93 mg/dL (ref 65–99)
Potassium: 4.4 mmol/L (ref 3.5–5.1)
SODIUM: 133 mmol/L — AB (ref 135–145)
Total Bilirubin: 0.7 mg/dL (ref 0.3–1.2)
Total Protein: 5.3 g/dL — ABNORMAL LOW (ref 6.5–8.1)

## 2017-10-14 LAB — MAGNESIUM: Magnesium: 1.6 mg/dL — ABNORMAL LOW (ref 1.7–2.4)

## 2017-10-14 LAB — PHOSPHORUS: PHOSPHORUS: 2.5 mg/dL (ref 2.5–4.6)

## 2017-10-14 MED ORDER — SENNA 8.6 MG PO TABS: 2.0000 | ORAL_TABLET | Freq: Two times a day (BID) | ORAL | 0 refills | Status: AC

## 2017-10-14 MED ORDER — POLYETHYLENE GLYCOL 3350 17 G PO PACK: 17.0000 g | PACK | Freq: Every day | ORAL | 0 refills | Status: AC | PRN

## 2017-10-14 MED ORDER — OXYCODONE-ACETAMINOPHEN 7.5-325 MG PO TABS: 0.5000 | ORAL_TABLET | Freq: Four times a day (QID) | ORAL | 0 refills | Status: AC | PRN

## 2017-10-14 MED ORDER — MAGNESIUM SULFATE 2 GM/50ML IV SOLN
2.0000 g | Freq: Once | INTRAVENOUS | Status: AC
  Administered 2017-10-14: 2 g via INTRAVENOUS
  Filled 2017-10-14: qty 50

## 2017-10-14 MED ORDER — METHOCARBAMOL 500 MG PO TABS: 500.0000 mg | ORAL_TABLET | Freq: Three times a day (TID) | ORAL | 0 refills | Status: AC | PRN

## 2017-10-14 NOTE — Clinical Social Work Placement (Signed)
Patient received and accepted bed offer at [facility]. Facility aware of patient's discharge and confirmed bed offer. PTAR contacted, patient's family notified. Patient's RN can call report to [phone], packet complete. CSW signing off, no other needs identified at this time.  CLINICAL SOCIAL WORK PLACEMENT  NOTE  Date:  10/14/2017  Patient Details  Name: [name] MRN: [id number] Date of Birth: 1918-10-03  Clinical Social Work is seeking post-discharge placement for this patient at the Skilled  Nursing Facility level of care (*CSW will initial, date and re-position this form in  chart as items are completed):  Yes   Patient/family provided with [facility] Clinical Social Work Department's list of facilities offering this level of care within the geographic area requested by the patient (or if unable, by the patient's family).  Yes   Patient/family informed of their freedom to choose among providers that offer the needed level of care, that participate in Medicare, Medicaid or managed care program needed by the patient, have an available bed and are willing to accept the patient.  Yes   Patient/family informed of [facility]'s ownership interest in [facility] and [facility], as well as of the fact that they are under no obligation to receive care at these facilities.  PASRR submitted to EDS on 10/14/17     PASRR number received on 10/14/17     Existing PASRR number confirmed on       FL2 transmitted to all facilities in geographic area requested by pt/family on 10/14/17     FL2 transmitted to all facilities within larger geographic area on       Patient informed that his/her managed care company has contracts with or will negotiate with certain facilities, including the following:        Yes   Patient/family informed of bed offers received.  Patient chooses bed at [facility]     Physician recommends and patient chooses bed at      Patient to be  transferred to [facility] [place] on 10/14/17.  Patient to be transferred to facility by PTAR     Patient family notified on 10/14/17 of transfer.  Name of family member notified:  Ed Scientist, physiological     PHYSICIAN       Additional Comment:    _______________________________________________ Antionette Poles, LCSW 10/14/2017, 4:15 PM

## 2017-10-14 NOTE — Progress Notes (Addendum)
CSW following to assist patient with discharge planning to [facility].  CSW informed by [facility] admissions staff [place] that patient has been accepted and that they have received insurance authorization for patient.  CSW contacted [place] [employer] [phone]) to inquire about patient's pending PASRR, staff reported that they have 24 hours to review patient's PASRR. CSW inquired about nurse assigned to patient's PASRR staff reported that patient has not been assigned a nurse yet. CSW inquired about patient possibly getting a PASRR today, staff reported that she didn't know.   Patient has a SNF bed and insurance authorization. Patient awaiting PASRR to discharge to SNF.  CSW updated patient's son Tere Mcconaughey).   Celso Sickle, [place] Clinical Social Worker [facility] Cell#: [phone]

## 2017-10-14 NOTE — Discharge Summary (Signed)
Physician Discharge Summary  [name]:[id number] DOB: 1918-08-30 DOA: 10/08/2017  PCP: Wilmer Floor., MD  Admit date: 10/08/2017 Discharge date: 10/14/2017  Admitted From: Home Disposition: SNF  Recommendations for Outpatient Follow-up:  1. Follow up with PCP in 1-2 weeks 2. Palliative Care to Follow at SNF 3. Follow up with Cardiology at D/C and with EP Dr. [name] for Pacer Check 4. Please obtain CMP/CBC, Mag, Phos in one week 5. Please follow up on the following pending results:  Home Health: No  Equipment/Devices: None  Discharge Condition: Stable  CODE STATUS: DO NOT RESUSCITATE Diet recommendation: Heart Healthy Low Sodium Diet  Brief/Interim Summary: The patient is 84 yo hx dementia, PPM, HTN, combined CHF, iron def, anemia, CVA had mech fall 2 days prior to presentation and is now unable to walk due to pain in her back and going down her hips. Family saw her fall.   IN ED: Xrays showed compression fractures of the spine at L3/ L4. BP's high in ED, over 200, treated IV hydralazine.   She was admitted for pain control and now IR was consulted for possible kyphoplasty or vertebroplasty.  Palliative care  evaluated for goals of care and symptom management.  Nuclear medicine bone scan done and showed no abnormal uptake to suggest acute healing fractures.  She was deemed not a candidate for kyphoplasty or vertebroplasty at this time because of a negative bone scan.  PT evaluated and recommended skilled nursing facility placement for strengthening.  Patient has improved and has no complaints today and she will be discharged to skilled nursing facility and will need to follow-up with PCP, Palliative, as well as Cardiology.  Discharge Diagnoses:  Active Problems:   Hypothyroidism   HYPERCHOLESTEROLEMIA   Essential hypertension   PACEMAKER, PERMANENT   H/O: CVA (cerebrovascular accident)   Dementia   Chronic diastolic CHF (congestive heart failure) (HCC)    Hypokalemia   Compression fracture of L3 lumbar vertebra, closed, initial encounter (HCC)  Fall/ with resultant Vertebral compression fracture: ofL3/L4 Lumbar Vertebra -Family wanted aggressive Measures and wants to see if she is a candidate for Kyphoplasty/Verteborplasty  -Interventional Radiology consulted for evaluation and prior to them seeing we will obtain a nuclear medicine bone scan to see acuity of fractures; Interventional Radiology to evaluate after her nuclear medicine bone scan -Nuclear Medicine bone scan Negative for Acute Fractures and patient not a candidate for Kyphoplasty/Vertebroplasty -IR discussed with family  -Palliative care consulted for goals of care as well as symptom and pain management and will have Palliative follow at SNF -Current pain management with Oxycodone 1 tablet p.o. every 6 as needed for moderate pain and by palliative and is been decreased to 5/325 rather than 7.5/325; Will continue at D/c -Palliative changed Robaxin 5 mg 3 times daily to PRN -Follow up with PCP  Suspected UTI -Urinalysis last night showed cloudy urine, with small blood in her urine, large leukocytes, nitrites, many bacteria, greater than 50 WBCs with few squamous cells. -Repeat urinalysis shows cloudy urine, small hemoglobin and urine, large leukocytes, negative nitrites, rare bacteria, as well as greater than 50 WBCs -Sent urine culture showed less than 10,000 colonies of insignificant growth. -Started empiric antibiotics with IV Rocephin and was continued for 3 days total as culture was not able to be obtained prior to antibiotics being hung -Patient becomes symptomatic recommend checking a urinalysis at the skilled nursing facility.  Essential Hypertension -Blood pressure was severely elevated this AM and was 179/104 -Started IV Hydralazine  10 mg every 6 hours as needed for systolic blood pressure greater than 180 or diastolic blood pressure greater than 100 -C/w Home Metoprolol  dosing and may need to increase -Held Losartan 25 mg Daily given patient's acute kidney injury intimally but restarted AKI is improved -Continue to trend blood pressures and continue to adequately treat pain   Acute Kidney Injury, improved -Improved -Avoid nephrotoxic medications but resume home losartan -Initially started gentle IV fluid hydration normal saline at 50 mL's per hour and BUN/Cr improved to 21/0.64 -IV fluids have been now discontinued -Continue to monitor and repeat CMP in a.m.  Chronic Diastolic CHF -Appears Euvolemic now. -Stopped patient's IV fluid hydration. -Continue with home Metoprolol as well as Losartan -Continue to monitor for signs and symptoms of volume overload.  Watch volume status very carefully -Patient is +2.309 Liters but appears euvolemic   Dementia  -Chronic -Expect some degree of sundowning while hospitalized -Placed on delirium precautions  Hypothyroidism -Continue with Levothyroxine 112 mcg p.o. daily before breakfast  Hyperlipidemia -Continue with Home Pravastatin 40 m p.o. nightly  Hyperkalemia, improved -Improved. Her potassium was 4.4 this morning -Continue to monitor and replete as necessary -Repeat CMP at SNF  Hypomagnesemia, improved -Patient magnesium this morning was 1.6 -Treated with IV mag sulfate 2 g prior to discharge -Continue to monitor and replete as necessary -Repeat mag level at SNF  Hypophosphatemia -Patient's phosphorus level this a.m. was 2.5 -Replete with IV K-Phos 20 mmol yesterday  -Continue to monitor and repeat as necessary -Repeat phosphorus level at SNF  Permanent pacemaker -We will have [employer] tech interrogate pacer -Was to be done Monday 10/13/2017 but never done as she was getting her NM Bone Scan so will need to follow up with Cardiology at D/C   Constipation -Added bisacodyl suppository 10 mg rectally daily as needed -Crease MiraLAX 17 g p.o. twice daily as well as continued  Sennosides 10 mL's p.o. twice daily  Leukocytosis, Improved -Patient's WBC was slightly elevated 10.7 yesterday and is now improved to 8.4 -Likely reactive secondary to pain -Continue to monitor sputum signs and symptoms of infection -Repeat CBC with Diff at SNF  Hx of CVA -Aspirin 81 mg, Clopidogrel 75 mg po Daily, and Pravastatin 40 mg qHS  Hx of CAD and NSTEMI -C/w Home Cardiac Medications with ASA, Clopidogrel, NTG, Metoprolol, and Losartan, and Pravastatin   Discharge Instructions Discharge Instructions    Call MD for:  difficulty breathing, headache or visual disturbances   Complete by:  As directed    Call MD for:  extreme fatigue   Complete by:  As directed    Call MD for:  hives   Complete by:  As directed    Call MD for:  persistant dizziness or light-headedness   Complete by:  As directed    Call MD for:  persistant nausea and vomiting   Complete by:  As directed    Call MD for:  redness, tenderness, or signs of infection (pain, swelling, redness, odor or green/yellow discharge around incision site)   Complete by:  As directed    Call MD for:  severe uncontrolled pain   Complete by:  As directed    Call MD for:  temperature >100.4   Complete by:  As directed    Diet - low sodium heart healthy   Complete by:  As directed    Discharge instructions   Complete by:  As directed    Up with PCP, Palliative care discharge, Cardiology.  Take  all medications as prescribed.  If symptoms change or worsen please return to the emergency room for evaluation   Increase activity slowly   Complete by:  As directed      Allergies as of 10/14/2017      Reactions   Tape Other (See Comments)   PATIENT'S SKIN IS VERY THIN AND WILL BRUISE AND TEAR EASILY!!      Medication List    STOP taking these medications   potassium chloride 10 MEQ tablet Commonly known as:  K-DUR     TAKE these medications   aspirin 81 MG chewable tablet Chew 1 tablet (81 mg total) by mouth daily.    CALCIUM PO Take by mouth.   citalopram 20 MG tablet Commonly known as:  CELEXA Take 20 mg by mouth daily.   clopidogrel 75 MG tablet Commonly known as:  PLAVIX Take 1 tablet (75 mg total) by mouth daily.   levothyroxine 112 MCG tablet Commonly known as:  SYNTHROID, LEVOTHROID Take 112 mcg by mouth daily before breakfast.   losartan 25 MG tablet Commonly known as:  COZAAR Take 25 mg by mouth at bedtime.   methocarbamol 500 MG tablet Commonly known as:  ROBAXIN Take 1 tablet (500 mg total) by mouth every 8 (eight) hours as needed for muscle spasms.   metoprolol tartrate 25 MG tablet Commonly known as:  LOPRESSOR Take 0.5 tablets (12.5 mg total) by mouth 2 (two) times daily. What changed:    how much to take  when to take this  additional instructions   multivitamin tablet Take 1 tablet by mouth daily.   multivitamin-lutein Caps capsule Take 1 capsule by mouth daily.   nitroGLYCERIN 0.4 MG SL tablet Commonly known as:  NITROSTAT Place 1 tablet (0.4 mg total) under the tongue every 5 (five) minutes as needed for chest pain.   oxyCODONE-acetaminophen 7.5-325 MG tablet Commonly known as:  PERCOCET Take 0.5 tablets by mouth every 6 (six) hours as needed. What changed:    when to take this  reasons to take this   polyethylene glycol packet Commonly known as:  MIRALAX / GLYCOLAX Take 17 g by mouth daily as needed.   pravastatin 40 MG tablet Commonly known as:  PRAVACHOL Take 40 mg by mouth at bedtime.   senna 8.6 MG Tabs tablet Commonly known as:  SENOKOT Take 2 tablets (17.2 mg total) by mouth 2 (two) times daily.   VITAMIN D PO Take by mouth.       Allergies  Allergen Reactions  . Tape Other (See Comments)    PATIENT'S SKIN IS VERY THIN AND WILL BRUISE AND TEAR EASILY!!   Consultations:  Palliative Care Medicine  Interventional Radiology  Procedures/Studies: Ct Head Wo Contrast  Result Date: 10/08/2017 CLINICAL DATA:  Fall, on Plavix  EXAM: CT HEAD WITHOUT CONTRAST TECHNIQUE: Contiguous axial images were obtained from the base of the skull through the vertex without intravenous contrast. COMPARISON:  04/27/2013 FINDINGS: Brain: There is atrophy and chronic small vessel disease changes. No acute intracranial abnormality. Specifically, no hemorrhage, hydrocephalus, mass lesion, acute infarction, or significant intracranial injury. Vascular: No hyperdense vessel or unexpected calcification. Skull: No acute calvarial abnormality. Sinuses/Orbits: Visualized paranasal sinuses and mastoids clear. Orbital soft tissues unremarkable. Other: None IMPRESSION: No acute intracranial abnormality. Atrophy, chronic microvascular disease. Electronically Signed   By: Charlett Nose M.D.   On: 10/08/2017 20:57   Ct Lumbar Spine Wo Contrast  Result Date: 10/08/2017 CLINICAL DATA:  Right hip pain after a fall  2 days ago. Initial encounter. EXAM: CT LUMBAR SPINE WITHOUT CONTRAST TECHNIQUE: Multidetector CT imaging of the lumbar spine was performed without intravenous contrast administration. Multiplanar CT image reconstructions were also generated. COMPARISON:  Chest radiograph 08/26/2016 FINDINGS: Segmentation: 5 lumbar type vertebrae. Alignment: Degenerative anterolisthesis of L4 on L5 and L5 on S1 measuring 3 mm. Vertebrae: L2 superior endplate Schmorl's node. L3 vertebral body compression fracture with 15% height loss centrally, not definitely new from the prior chest radiographs. L4 superior endplate compression fracture with 25-30% height loss, also not definitely new from the prior radiographs though more prominent in appearance. No retropulsion at either level. No posterior element fracture. No destructive osseous process. Paraspinal and other soft tissues: No significant paravertebral hematoma at either the L3 or L4 levels. Partially visualized small right pleural effusion and bibasilar atelectasis. Possible small sliding hiatal hernia. Extensive abdominal  aortic atherosclerosis without aneurysm. Disc levels: Advanced disc degeneration at T11-12 and T12-L1 with severe disc space narrowing and vacuum disc. Moderate left neural foraminal stenosis at T12-L1. L1-2: Mild disc space narrowing and vacuum disc. Disc bulging and mild facet arthrosis result in at most mild bilateral neural foraminal stenosis without spinal stenosis. L2-3: Mild disc bulging and moderate facet arthrosis result in borderline spinal stenosis without neural foraminal stenosis. L3-4: Disc bulging and severe facet and ligamentum flavum hypertrophy result in moderate to severe spinal stenosis and moderate bilateral neural foraminal stenosis. L4-5: Vacuum disc. Anterolisthesis with bulging uncovered disc and severe facet hypertrophy result in moderate to severe spinal stenosis and moderate bilateral neural foraminal stenosis. L5-S1: Mild disc space narrowing and vacuum disc. Anterolisthesis with bulging uncovered disc, ligamentum flavum thickening, and severe facet arthrosis result in moderate to severe right and mild left neural foraminal stenosis and mild spinal stenosis. IMPRESSION: 1. Mild L3 and L4 compression fractures, potentially chronic. If the patient has pain directly referable to this region and vertebral augmentation would be considered for treatment of an acute fracture, consider nuclear medicine bone scan for further evaluation. 2. Lumbar disc and facet degeneration with moderate to severe spinal stenosis and moderate bilateral neural foraminal stenosis at L3-4 and L4-5. 3.  Aortic Atherosclerosis (ICD10-I70.0). Electronically Signed   By: Sebastian Ache M.D.   On: 10/08/2017 15:53   Nm Bone Scan Whole Body  Result Date: 10/13/2017 CLINICAL DATA:  Patient fell several days ago and is unable wall due to back pain radiating into the hips. The x-ray at the time revealed compression fractures of L3 and L4 of uncertain age EXAM: NUCLEAR MEDICINE WHOLE BODY BONE SCAN TECHNIQUE: Whole body  anterior and posterior images were obtained approximately 3 hours after intravenous injection of radiopharmaceutical. RADIOPHARMACEUTICALS:  21 mCi Technetium-50m MDP IV COMPARISON:  CT scan of the lumbar spine of October 08, 2017 FINDINGS: There is adequate uptake of the radiopharmaceutical by the skeleton. There is adequate soft tissue clearance and renal activity. Uptake within the lumbar spine is normal. Uptake within the cervical and thoracic spine is also normal. There is no abnormal uptake within the ribs or pelvis. There is photopenia in the hips due to the presence of prostheses. Uptake within the pectoral girdle is within the limits of normal for age. Similarly uptake in the lower extremities is within the limits of normal. IMPRESSION: No abnormal uptake in the lumbar spine to suggest acute healing fractures. No significant abnormality elsewhere to suggest acute post traumatic injury. Electronically Signed   By: David  [place] M.D.   On: 10/13/2017 15:09   Dg Hip  Unilat With Pelvis 2-3 Views Left  Result Date: 10/08/2017 CLINICAL DATA:  BILATERAL hip pain after falling. EXAM: DG HIP (WITH OR WITHOUT PELVIS) 2-3V LEFT; DG HIP (WITH OR WITHOUT PELVIS) 2-3V RIGHT COMPARISON:  None. FINDINGS: Patient is status post BILATERAL total hip arthroplasty. There is no dislocation or periprosthetic fracture/loosening. No pelvic fractures are seen. There is vascular calcification. Lumbar degenerative disc disease. IMPRESSION: Unremarkable appearing RIGHT and LEFT total hip arthroplasty without dislocation, periprosthetic fracture, or loosening Electronically Signed   By: Elsie Stain M.D.   On: 10/08/2017 15:05   Dg Hip Unilat With Pelvis 2-3 Views Right  Result Date: 10/08/2017 CLINICAL DATA:  BILATERAL hip pain after falling. EXAM: DG HIP (WITH OR WITHOUT PELVIS) 2-3V LEFT; DG HIP (WITH OR WITHOUT PELVIS) 2-3V RIGHT COMPARISON:  None. FINDINGS: Patient is status post BILATERAL total hip arthroplasty. There is no  dislocation or periprosthetic fracture/loosening. No pelvic fractures are seen. There is vascular calcification. Lumbar degenerative disc disease. IMPRESSION: Unremarkable appearing RIGHT and LEFT total hip arthroplasty without dislocation, periprosthetic fracture, or loosening Electronically Signed   By: Elsie Stain M.D.   On: 10/08/2017 15:05    Subjective: Seen and examined at bedside is resting.  She has no complaints sleep and had no complaints.  She remains pleasantly confused.  She denied any active pain.   Discharge Exam: Vitals:   10/13/17 2220 10/14/17 0616  BP: (!) 167/67 (!) 179/104  Pulse: 65 64  Resp: 18 20  Temp: 98.9 F (37.2 C) 98.5 F (36.9 C)  SpO2: 98% 100%   Vitals:   10/13/17 0655 10/13/17 1339 10/13/17 2220 10/14/17 0616  BP: (!) 204/84 (!) 180/82 (!) 167/67 (!) 179/104  Pulse: 64 67 65 64  Resp:   18 20  Temp:  98.5 F (36.9 C) 98.9 F (37.2 C) 98.5 F (36.9 C)  TempSrc:  Oral Oral Oral  SpO2:  99% 98% 100%  Weight:      Height:       General: Pt is not alert, and awoken from sleep, not in acute distress; pleasantly confused and demented Cardiovascular: RRR, S1/S2 +, no rubs, no gallops Respiratory: Diminished bilaterally, no wheezing, no rhonchi; unlabored breathing  Abdominal: Soft, NT, ND, bowel sounds + Extremities: Trace edema, no cyanosis  The results of significant diagnostics from this hospitalization (including imaging, microbiology, ancillary and laboratory) are listed below for reference.    Microbiology: Recent Results (from the past 240 hour(s))  Culture, Urine     Status: Abnormal   Collection Time: 10/10/17  6:30 PM  Result Value Ref Range Status   Specimen Description   Final    URINE, RANDOM Performed at [facility], [address] [place], [place] [address]    Special Requests   Final    NONE Performed at [facility], [address] [place], [place] [address]    Culture (A)  Final     <10,000 COLONIES/mL INSIGNIFICANT GROWTH Performed at [facility], [address], [place], [place] [address]    Report Status 10/12/2017 FINAL  Final    Labs: BNP (last 3 results) No results for input(s): BNP in the last 8760 hours. Basic Metabolic Panel: Recent Labs  Lab 10/10/17 0811 10/11/17 0447 10/12/17 0535 10/13/17 0815 10/14/17 0544  NA 137 138 137 137 133*  K 5.1 4.4 4.1 4.0 4.4  CL 102 105 105 105 101  CO2 GLUCOSE 96 108* 94  170* 93  BUN 30* 32* 29* 25* 21*  CREATININE 1.05* 0.82 0.82 0.77 0.64  CALCIUM 8.5* 8.2* 7.8* 8.5* 8.1*  MG 2.2 2.1 1.8 1.8 1.6*  PHOS 3.3 2.8 2.6 2.0* 2.5   Liver Function Tests: Recent Labs  Lab 10/10/17 0811 10/11/17 0447 10/12/17 0535 10/13/17 0815 10/14/17 0544  AST 34  ALT 9* 10* 11* 17 20  ALKPHOS 74 85 86 108 98  BILITOT 1.2 0.4 0.5 0.6 0.7  PROT 5.6* 5.3* 5.0* 6.0* 5.3*  ALBUMIN 2.5* 2.4* 2.2* 2.5* 2.1*   No results for input(s): LIPASE, AMYLASE in the last 168 hours. No results for input(s): AMMONIA in the last 168 hours. CBC: Recent Labs  Lab 10/11/17 0447 10/12/17 0535 10/12/17 1106 10/13/17 0815 10/14/17 0544  WBC 10.6* 7.8 8.7 10.7* 8.4  NEUTROABS 8.2* 5.3 6.4 7.9* 5.4  HGB 11.2* 10.8* 11.4* 12.2 10.8*  HCT 35.9* 34.5* 36.2 38.2 33.7*  MCV 98.1 97.2 96.8 95.7 94.7  PLT 250 235 250 318 304   Cardiac Enzymes: No results for input(s): CKTOTAL, CKMB, CKMBINDEX, TROPONINI in the last 168 hours. BNP: Invalid input(s): POCBNP CBG: No results for input(s): GLUCAP in the last 168 hours. D-Dimer No results for input(s): DDIMER in the last 72 hours. Hgb A1c No results for input(s): HGBA1C in the last 72 hours. Lipid Profile No results for input(s): CHOL, HDL, LDLCALC, TRIG, CHOLHDL, LDLDIRECT in the last 72 hours. Thyroid function studies No results for input(s): TSH, T4TOTAL, T3FREE, THYROIDAB in the last 72 hours.  Invalid input(s): FREET3 Anemia work up No results  for input(s): VITAMINB12, FOLATE, FERRITIN, TIBC, IRON, RETICCTPCT in the last 72 hours. Urinalysis    Component Value Date/Time   COLORURINE YELLOW 10/10/2017 1830   APPEARANCEUR CLOUDY (A) 10/10/2017 1830   LABSPEC 1.017 10/10/2017 1830   PHURINE 5.0 10/10/2017 1830   GLUCOSEU NEGATIVE 10/10/2017 1830   HGBUR SMALL (A) 10/10/2017 1830   BILIRUBINUR NEGATIVE 10/10/2017 1830   KETONESUR NEGATIVE 10/10/2017 1830   PROTEINUR 30 (A) 10/10/2017 1830   UROBILINOGEN 1.0 02/28/2015 1044   NITRITE NEGATIVE 10/10/2017 1830   LEUKOCYTESUR LARGE (A) 10/10/2017 1830   Sepsis Labs Invalid input(s): PROCALCITONIN,  WBC,  LACTICIDVEN Microbiology Recent Results (from the past 240 hour(s))  Culture, Urine     Status: Abnormal   Collection Time: 10/10/17  6:30 PM  Result Value Ref Range Status   Specimen Description   Final    URINE, RANDOM Performed at [facility], [address] [place], [place] [address]    Special Requests   Final    NONE Performed at [facility], [address] [place], [place] [address]    Culture (A)  Final    <10,000 COLONIES/mL INSIGNIFICANT GROWTH Performed at [facility], [address], [place], [place] [address]    Report Status 10/12/2017 FINAL  Final   Time coordinating discharge: 35 minutes  SIGNED:  Merlene Laughter, DO [facility] 10/14/2017, 1:40 PM Pager [phone]  If 7PM-7AM, please contact night-coverage [email or web address] Password [email or web address]

## 2017-10-14 NOTE — Progress Notes (Signed)
Per chart review, patient's family has decided to pursue ST rehab at [facility]. CSW spoke with patient's son Melanee Spry "Eddie" Berkovich) at bedside who confirmed plan for patient to dc to SNF for ST rehab. Patient's son confirmed that their preference is [facility] then [facility]. CSW agreed to follow up with preferred SNF.  CSW completed patient's FL2.  CSW completed patient's PASRR. PASRR under manual review MUST ID  [id number].   CSW contacted [facility] and spoke with admissions staff member French Ana. Staff agreed to review patient's referral. Staff inquired about patient's ability to walk. CSW inquired with patient's RN. Patient's RN agreed to contact PT to see patient again.   CSW will continue to follow and assist with discharge planning.  Celso Sickle, [place] Clinical Social Worker [facility] Cell#: [phone]

## 2017-10-14 NOTE — Progress Notes (Signed)
Report called to [place] at [facility] in [place]. PTAR arranged to pick up patient by CSW. Will continue to monitor until PT[facility] arrives.

## 2017-10-14 NOTE — Progress Notes (Signed)
Physical Therapy Treatment Patient Details Name: [name] MRN: [id number] DOB: 1919-05-15 Today's Date: 10/14/2017    History of Present Illness 84 yo female admitted with essential hypertension, L3/L4 comp fx on imaging (possibly chronic), pain after fall at home. Hx of L and R THAs, bradycardia, pacemaker, CHF, NSTEMI, dementia, falls.    PT Comments    Son in room during session.  Stated pt lives in her home and the 3 grown children take turns to stay with pt.  Pt was able to amb with a walker with assist.  Also, they assisted with ADL's, meals and meds.   Pt very sleepy/groggy initially.  Assisted OOB to [facility] due to incont stool.  Pt was able to stand and support her own body weight but then required assist to complete 1/4 pivot to [facility].  Pt repeated "I want to lay down".  No real c/o pain.  Pt did have a BM and was able to stand + 2 assist for peri care care.  Using a walker, pt was able to amb 2 feet forward but with difficulty advancing R LE and weight shifting.  Static stand was fair +.  Pulled recliner behind pt as she was able to take a few steps.  Positioned in recliner to comfort.   Vitals at beginning prior to mobility: BP 156/70, HR 61, 2 lts O2 97%, RA 94%   Follow Up Recommendations  SNF     Equipment Recommendations       Recommendations for Other Services       Precautions / Restrictions Precautions Precautions: Fall Precaution Comments: monitor O2 sats; back pain with activity(Comp Fx) Restrictions Weight Bearing Restrictions: No    Mobility  Bed Mobility Overal bed mobility: Needs Assistance Bed Mobility: Supine to Sit     Supine to sit: Max assist     General bed mobility comments: Assist for trunk and bil LEs. Multimodal cueing required. Increased time.   Once upright EOB, pt able to static sit at Supervision level.  Transfers Overall transfer level: Needs assistance Equipment used: None Transfers: Stand Pivot Transfers;Sit to/from Stand("Bear  Hug" from bed then sit to stand with walker from [facility]) Sit to Stand: Mod assist Stand pivot transfers: Max assist       General transfer comment: from elevated bed performed "Bear Hug" stand pivot 1/4 turn to [facility] then from [facility] performed sit to stand with walker.  Once standing upright, pt able to support self.    Ambulation/Gait Ambulation/Gait assistance: Max assist;+2 physical assistance Ambulation Distance (Feet): 2 Feet Assistive device: Rolling walker (2 wheeled) Gait Pattern/deviations: Step-to pattern;Decreased stance time - right;Decreased step length - right;Decreased step length - left Gait velocity: decreased   General Gait Details: pt was able to progress gait 2 feet but required + 2 side by side assist and increased time to functionally weight shift.  Greater difficulty advancing R LE with noted R foot internal roation .  Able to support her own body weight and able to use walker.      Stairs             Wheelchair Mobility    Modified Rankin (Stroke Patients Only)       Balance                                            Cognition Arousal/Alertness: Awake/alert;Lethargic(50/50)  General Comments: sleepy/groggy with intervals of alertness.  Pt repeats "I want to lay down".  AxO x1    following repeat functional commands       Exercises      General Comments        Pertinent Vitals/Pain Pain Assessment: Faces Pain Location: back Pain Descriptors / Indicators: Grimacing Pain Intervention(s): Monitored during session;Repositioned    Home Living                      Prior Function            PT Goals (current goals can now be found in the care plan section) Progress towards PT goals: Progressing toward goals    Frequency    Min 2X/week      PT Plan Current plan remains appropriate    Co-evaluation              AM-PAC PT "6 Clicks" Daily Activity   Outcome Measure  Difficulty turning over in bed (including adjusting bedclothes, sheets and blankets)?: Unable Difficulty moving from lying on back to sitting on the side of the bed? : Unable Difficulty sitting down on and standing up from a chair with arms (e.g., wheelchair, bedside commode, etc,.)?: Unable Help needed moving to and from a bed to chair (including a wheelchair)?: Total Help needed walking in hospital room?: Total Help needed climbing 3-5 steps with a railing? : Total 6 Click Score: 6    End of Session Equipment Utilized During Treatment: Gait belt Activity Tolerance: Patient limited by fatigue Patient left: in chair;with family/visitor present;with call bell/phone within reach Nurse Communication: Mobility status(+ BM) PT Visit Diagnosis: Muscle weakness (generalized) (M62.81);Difficulty in walking, not elsewhere classified (R26.2);Pain     Time: 1100-1125 PT Time Calculation (min) (ACUTE ONLY): 25 min  Charges:  $Gait Training: 8-22 mins $Therapeutic Activity: 8-22 mins                    G Codes:       {Givanni Staron  PTA [facility]  Acute  Rehab Pager      [phone]

## 2017-10-14 NOTE — NC FL2 (Signed)
[place] MEDICAID FL2 LEVEL OF CARE SCREENING TOOL     IDENTIFICATION  Patient Name: [name] Birthdate: Feb 09, 1919 Sex: female Admission Date (Current Location): 10/08/2017  [facility] and [place] Number:  Producer, television/film/video and Address:  [facility],  [address] [place], [place] [address]      Provider Number: [id number]  Attending Physician Name and Address:  Merlene Laughter, DO  Relative Name and Phone Number:       Current Level of Care: Hospital Recommended Level of Care: Skilled Nursing Facility Prior Approval Number:    Date Approved/Denied:   PASRR Number:    Discharge Plan: SNF    Current Diagnoses: Patient Active Problem List   Diagnosis Date Noted  . Compression fracture of L3 lumbar vertebra, closed, initial encounter (HCC) 10/08/2017  . Acute on chronic combined CHF:  (08/28/2016) EF 60-65% 08/28/2016  . Pacemaker at end of battery life: pacemaker generator change on 08/28/2016 08/26/2016  . Acute diastolic heart failure (HCC) 08/26/2016  . UTI (lower urinary tract infection) 02/28/2015  . NSTEMI (non-ST elevated myocardial infarction) (HCC) 02/28/2015  . SOB (shortness of breath) 02/28/2015  . Hypokalemia 02/28/2015  . Chronic diastolic CHF (congestive heart failure) (HCC) 06/13/2014  . Chest discomfort 06/01/2014  . Dementia 06/01/2014  . Sinus node dysfunction s/p Medtronic pacemaker 2008 06/01/2014  . Delirium 04/27/2013  . H/O: CVA (cerebrovascular accident) 04/27/2013  . Hypothyroidism 02/24/2009  . HYPERCHOLESTEROLEMIA 02/24/2009  . Iron deficiency anemia 02/24/2009  . Essential hypertension 02/24/2009  . BRADYCARDIA 02/24/2009  . OSTEOARTHRITIS 02/24/2009  . DIZZINESS 02/24/2009  . PACEMAKER, PERMANENT 02/24/2009    Orientation RESPIRATION BLADDER Height & Weight     Self  O2(2L/min) Incontinent Weight: 136 lb 0.4 oz (61.7 kg) Height:   (157.5 cm)  BEHAVIORAL SYMPTOMS/MOOD NEUROLOGICAL BOWEL NUTRITION STATUS       Incontinent Diet(see dc summary)  AMBULATORY STATUS COMMUNICATION OF NEEDS Skin   Extensive Assist Verbally Normal                       Personal Care Assistance Level of Assistance  Bathing, Feeding, Dressing Bathing Assistance: Maximum assistance Feeding assistance: Limited assistance Dressing Assistance: Maximum assistance     Functional Limitations Info  Sight, Hearing, Speech Sight Info: Impaired Hearing Info: Impaired Speech Info: Adequate    SPECIAL CARE FACTORS FREQUENCY  PT (By licensed PT), OT (By licensed OT)     PT Frequency: 5x/week OT Frequency: 5x/week            Contractures Contractures Info: Not present    Additional Factors Info  Code Status, Allergies Code Status Info: DNR Allergies Info: Tape           Current Medications (10/14/2017):  This is the current hospital active medication list Current Facility-Administered Medications  Medication Dose Route Frequency Provider Last Rate Last Dose  . acetaminophen (TYLENOL) tablet 650 mg  650 mg Oral Q6H PRN Therisa Doyne, MD       Or  . acetaminophen (TYLENOL) suppository 650 mg  650 mg Rectal Q6H PRN Doutova, Anastassia, MD      . acetaminophen (TYLENOL) tablet 650 mg  650 mg Oral Once Therisa Doyne, MD   Stopped at 10/08/17 1618  . bisacodyl (DULCOLAX) suppository 10 mg  10 mg Rectal Daily PRN Marguerita Merles Latif, DO      . calcitonin (salmon) (MIACALCIN/FORTICAL) nasal spray 1 spray  1 spray Alternating Nares Daily Romie Minus, MD  1 spray at 10/14/17 0846  . cefTRIAXone (ROCEPHIN) 1 g in sodium chloride 0.9 % 100 mL IVPB  1 g Intravenous Q24H Sheikh, Kateri Mc [place], DO 200 mL/hr at 10/13/17 1812 1 g at 10/13/17 1812  . citalopram (CELEXA) tablet 20 mg  20 mg Oral Daily Doutova, Anastassia, MD   20 mg at 10/14/17 0846  . hydrALAZINE (APRESOLINE) injection 10 mg  10 mg Intravenous Q6H PRN Marguerita Merles [place], DO   10 mg at 10/14/17 8295  . levothyroxine (SYNTHROID, LEVOTHROID)  tablet 112 mcg  112 mcg Oral QAC breakfast Therisa Doyne, MD   112 mcg at 10/14/17 0845  . losartan (COZAAR) tablet 25 mg  25 mg Oral QHS Marguerita Merles [place], DO   25 mg at 10/13/17 2219  . methocarbamol (ROBAXIN) tablet 500 mg  500 mg Oral Q8H PRN Romie Minus, MD      . metoprolol tartrate (LOPRESSOR) 25 mg/10 mL oral suspension 6.25 mg  6.25 mg Oral Daily Doutova, Anastassia, MD   6.25 mg at 10/14/17 0845   And  . metoprolol tartrate (LOPRESSOR) tablet 12.5 mg  12.5 mg Oral QHS Doutova, Anastassia, MD   12.5 mg at 10/13/17 2220  . ondansetron (ZOFRAN) tablet 4 mg  4 mg Oral Q6H PRN Doutova, Anastassia, MD       Or  . ondansetron (ZOFRAN) injection 4 mg  4 mg Intravenous Q6H PRN Doutova, Anastassia, MD      . oxyCODONE-acetaminophen (PERCOCET/ROXICET) 5-325 MG per tablet 1 tablet  1 tablet Oral Q6H PRN Romie Minus, MD   1 tablet at 10/13/17 1253  . polyethylene glycol (MIRALAX / GLYCOLAX) packet 17 g  17 g Oral BID Marguerita Merles [place], DO   17 g at 10/14/17 0845  . pravastatin (PRAVACHOL) tablet 40 mg  40 mg Oral QHS Therisa Doyne, MD   40 mg at 10/13/17 2219  . senna (SENOKOT) tablet 17.2 mg  2 tablet Oral BID Marguerita Merles Latif, DO   17.2 mg at 10/14/17 6213     Discharge Medications: Please see discharge summary for a list of discharge medications.  Relevant Imaging Results:  Relevant Lab Results:   Additional Information SSN  [id number]  Antionette Poles, LCSW

## 2017-10-22 DIAGNOSIS — R262 Difficulty in walking, not elsewhere classified: Secondary | ICD-10-CM | POA: Diagnosis not present

## 2017-11-05 ENCOUNTER — Encounter: Payer: Medicare Other | Admitting: Physician Assistant

## 2017-11-05 DIAGNOSIS — M1990 Unspecified osteoarthritis, unspecified site: Secondary | ICD-10-CM | POA: Diagnosis not present

## 2017-11-05 DIAGNOSIS — I2510 Atherosclerotic heart disease of native coronary artery without angina pectoris: Secondary | ICD-10-CM | POA: Diagnosis not present

## 2017-11-05 DIAGNOSIS — E7800 Pure hypercholesterolemia, unspecified: Secondary | ICD-10-CM | POA: Diagnosis not present

## 2017-11-05 DIAGNOSIS — E039 Hypothyroidism, unspecified: Secondary | ICD-10-CM | POA: Diagnosis not present

## 2017-11-05 DIAGNOSIS — I110 Hypertensive heart disease with heart failure: Secondary | ICD-10-CM | POA: Diagnosis not present

## 2017-11-05 DIAGNOSIS — S32048D Other fracture of fourth lumbar vertebra, subsequent encounter for fracture with routine healing: Secondary | ICD-10-CM | POA: Diagnosis not present

## 2017-11-05 DIAGNOSIS — I252 Old myocardial infarction: Secondary | ICD-10-CM | POA: Diagnosis not present

## 2017-11-05 DIAGNOSIS — S8011XD Contusion of right lower leg, subsequent encounter: Secondary | ICD-10-CM | POA: Diagnosis not present

## 2017-11-05 DIAGNOSIS — D509 Iron deficiency anemia, unspecified: Secondary | ICD-10-CM | POA: Diagnosis not present

## 2017-11-05 DIAGNOSIS — K5900 Constipation, unspecified: Secondary | ICD-10-CM | POA: Diagnosis not present

## 2017-11-05 DIAGNOSIS — I5032 Chronic diastolic (congestive) heart failure: Secondary | ICD-10-CM | POA: Diagnosis not present

## 2017-11-10 DIAGNOSIS — S8011XD Contusion of right lower leg, subsequent encounter: Secondary | ICD-10-CM | POA: Diagnosis not present

## 2017-11-10 DIAGNOSIS — I110 Hypertensive heart disease with heart failure: Secondary | ICD-10-CM | POA: Diagnosis not present

## 2017-11-10 DIAGNOSIS — D509 Iron deficiency anemia, unspecified: Secondary | ICD-10-CM | POA: Diagnosis not present

## 2017-11-10 DIAGNOSIS — E039 Hypothyroidism, unspecified: Secondary | ICD-10-CM | POA: Diagnosis not present

## 2017-11-10 DIAGNOSIS — S32048D Other fracture of fourth lumbar vertebra, subsequent encounter for fracture with routine healing: Secondary | ICD-10-CM | POA: Diagnosis not present

## 2017-11-10 DIAGNOSIS — I2510 Atherosclerotic heart disease of native coronary artery without angina pectoris: Secondary | ICD-10-CM | POA: Diagnosis not present

## 2017-11-10 DIAGNOSIS — E7800 Pure hypercholesterolemia, unspecified: Secondary | ICD-10-CM | POA: Diagnosis not present

## 2017-11-10 DIAGNOSIS — I5032 Chronic diastolic (congestive) heart failure: Secondary | ICD-10-CM | POA: Diagnosis not present

## 2017-11-10 DIAGNOSIS — I252 Old myocardial infarction: Secondary | ICD-10-CM | POA: Diagnosis not present

## 2017-11-10 DIAGNOSIS — K5900 Constipation, unspecified: Secondary | ICD-10-CM | POA: Diagnosis not present

## 2017-11-10 DIAGNOSIS — M1990 Unspecified osteoarthritis, unspecified site: Secondary | ICD-10-CM | POA: Diagnosis not present

## 2017-11-13 DIAGNOSIS — E039 Hypothyroidism, unspecified: Secondary | ICD-10-CM | POA: Diagnosis not present

## 2017-11-13 DIAGNOSIS — E7800 Pure hypercholesterolemia, unspecified: Secondary | ICD-10-CM | POA: Diagnosis not present

## 2017-11-13 DIAGNOSIS — I5032 Chronic diastolic (congestive) heart failure: Secondary | ICD-10-CM | POA: Diagnosis not present

## 2017-11-13 DIAGNOSIS — S8011XD Contusion of right lower leg, subsequent encounter: Secondary | ICD-10-CM | POA: Diagnosis not present

## 2017-11-13 DIAGNOSIS — I2510 Atherosclerotic heart disease of native coronary artery without angina pectoris: Secondary | ICD-10-CM | POA: Diagnosis not present

## 2017-11-13 DIAGNOSIS — D509 Iron deficiency anemia, unspecified: Secondary | ICD-10-CM | POA: Diagnosis not present

## 2017-11-13 DIAGNOSIS — K5900 Constipation, unspecified: Secondary | ICD-10-CM | POA: Diagnosis not present

## 2017-11-13 DIAGNOSIS — S32048D Other fracture of fourth lumbar vertebra, subsequent encounter for fracture with routine healing: Secondary | ICD-10-CM | POA: Diagnosis not present

## 2017-11-13 DIAGNOSIS — M1990 Unspecified osteoarthritis, unspecified site: Secondary | ICD-10-CM | POA: Diagnosis not present

## 2017-11-13 DIAGNOSIS — I110 Hypertensive heart disease with heart failure: Secondary | ICD-10-CM | POA: Diagnosis not present

## 2017-11-13 DIAGNOSIS — I252 Old myocardial infarction: Secondary | ICD-10-CM | POA: Diagnosis not present

## 2017-11-17 DIAGNOSIS — S32049S Unspecified fracture of fourth lumbar vertebra, sequela: Secondary | ICD-10-CM | POA: Diagnosis not present

## 2017-11-17 DIAGNOSIS — I509 Heart failure, unspecified: Secondary | ICD-10-CM | POA: Diagnosis not present

## 2017-11-17 DIAGNOSIS — W19XXXD Unspecified fall, subsequent encounter: Secondary | ICD-10-CM | POA: Diagnosis not present

## 2017-11-17 DIAGNOSIS — N390 Urinary tract infection, site not specified: Secondary | ICD-10-CM | POA: Diagnosis not present

## 2017-12-02 ENCOUNTER — Telehealth: Payer: Self-pay | Admitting: Physician Assistant

## 2017-12-02 NOTE — Telephone Encounter (Signed)
New Message:       Pt's daughter is calling and states that pt is currently in the care of hospice. Pt's daughter states that a technician once told her if she ever gets bed ridden that some one could come to her for these visits.

## 2017-12-04 NOTE — Telephone Encounter (Addendum)
Spoke with MotorolaPaulette Keller- she is referring to the pacemaker checks with Dr. Ladona Ridgelaylor. [name] isn't due to see Dr. Ladona Ridgelaylor until later this year. She can use remote monitoring to check PPM from home. She reports her brothers have been sending transmissions for pacemaker, we have not received transmissions since 2017.  I called [name] [phone]) to get monitor information in order to receive transmissions. He is feeding his mother right now and will call back with information.

## 2017-12-11 DIAGNOSIS — N390 Urinary tract infection, site not specified: Secondary | ICD-10-CM | POA: Diagnosis not present

## 2017-12-22 ENCOUNTER — Encounter: Payer: Medicare Other | Admitting: Physician Assistant

## 2017-12-22 NOTE — Telephone Encounter (Signed)
Letter sent with instructions to send remote transmission.

## 2018-12-16 DEATH — deceased

## 2019-11-25 IMAGING — CT CT L SPINE W/O CM
3 of 4 series · 12 of 33 positions shown, 14 images · non-contrast
Comparison: Chest radiograph 08/26/2016

CLINICAL DATA: Right hip pain after a fall 2 days ago. Initial
encounter.

EXAM:
CT LUMBAR SPINE WITHOUT CONTRAST
TECHNIQUE: Multidetector CT imaging of the lumbar spine was performed without
intravenous contrast administration. Multiplanar CT image
reconstructions were also generated.

[Series 4: l spine st · axial · 0.28mm/px · z∈[-538,-394]mm · 4 of 109 slices shown, 5 images]
[im 19/109  soft-tissue]
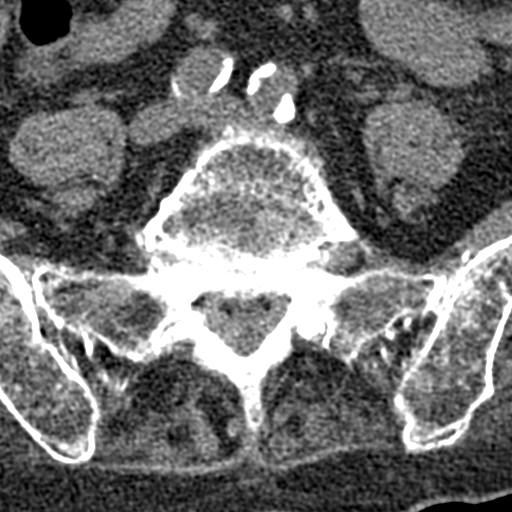
[im 19/109  bone]
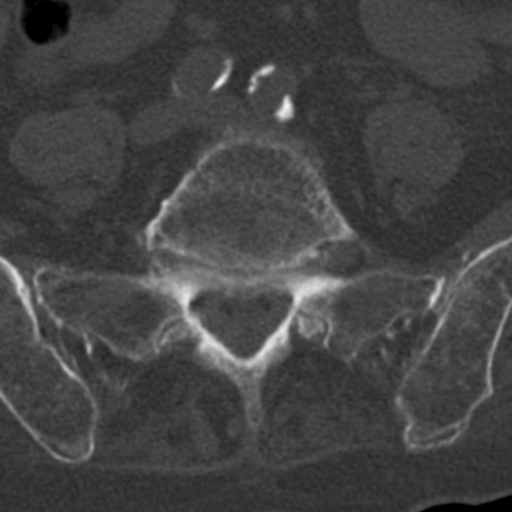
[im 37/109  bone]
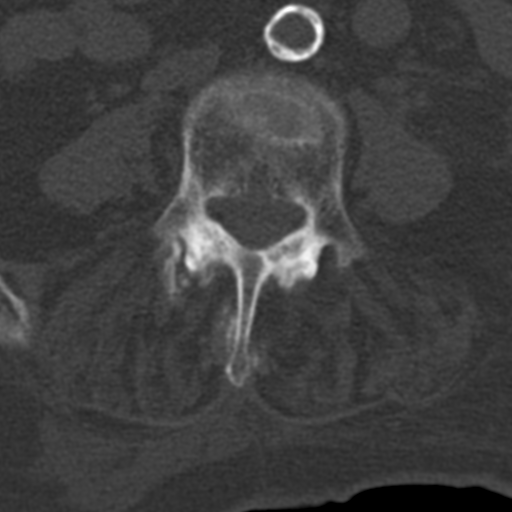
[im 73/109  bone]
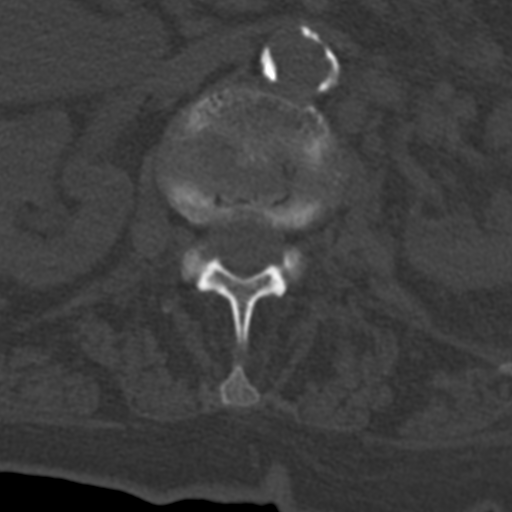
[im 91/109  bone]
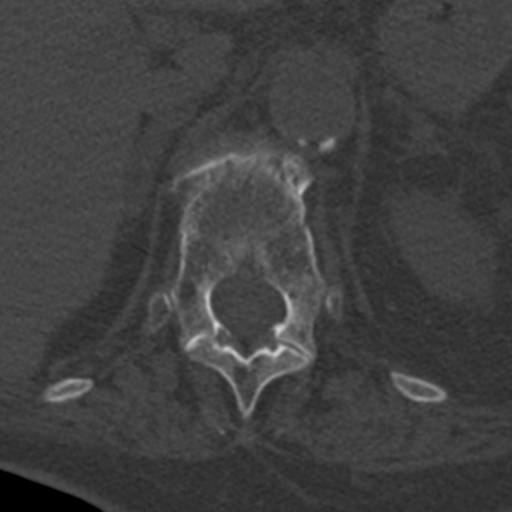

[Series 8: coronal bone · coronal · 0.25mm/px · 3 of 70 slices shown]
[im 14/70  bone]
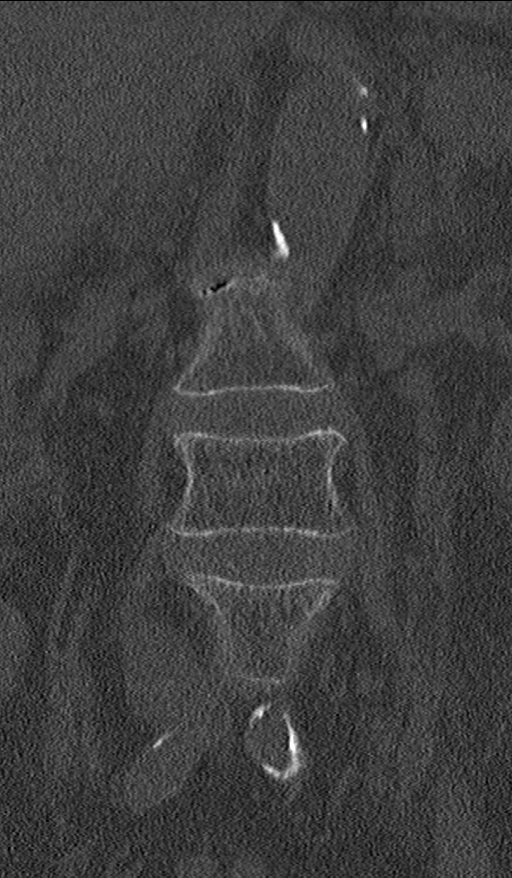
[im 28/70  bone]
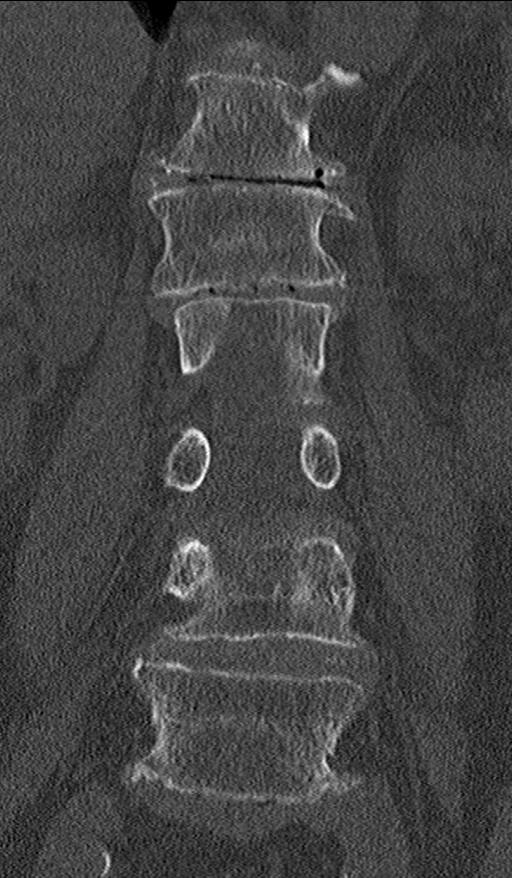
[im 42/70  bone]
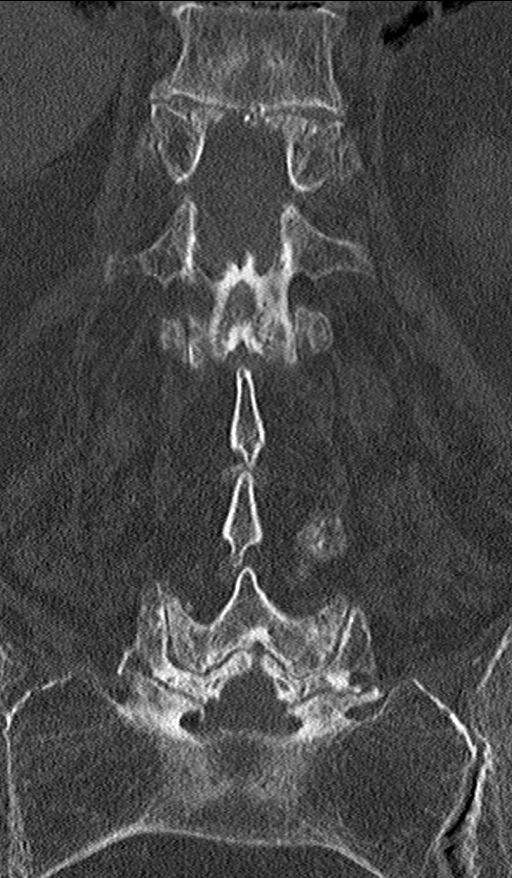

[Series 10: sagittal st · sagittal · 0.32mm/px · 5 of 61 slices shown, 6 images]
[im 21/61  bone]
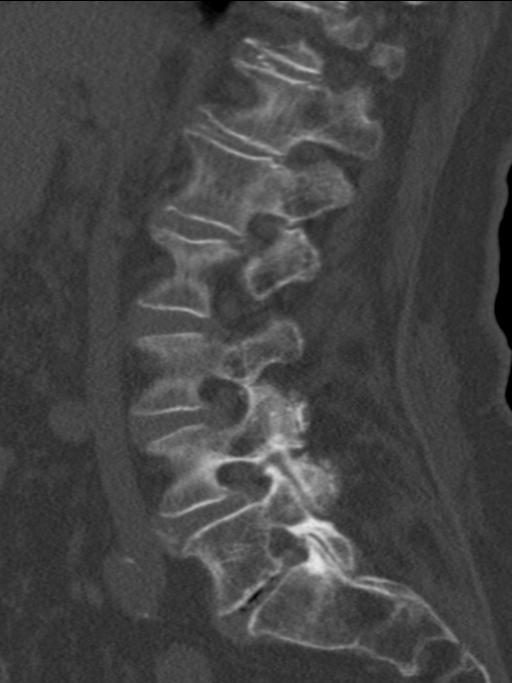
[im 26/61  bone]
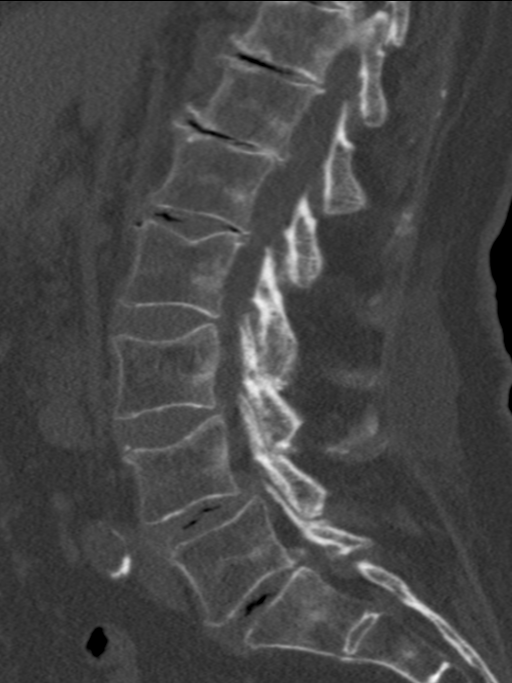
[im 31/61  soft-tissue]
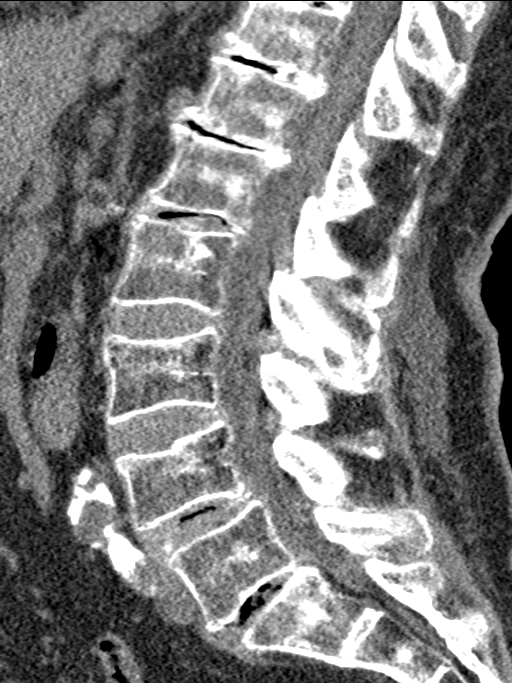
[im 31/61  bone]
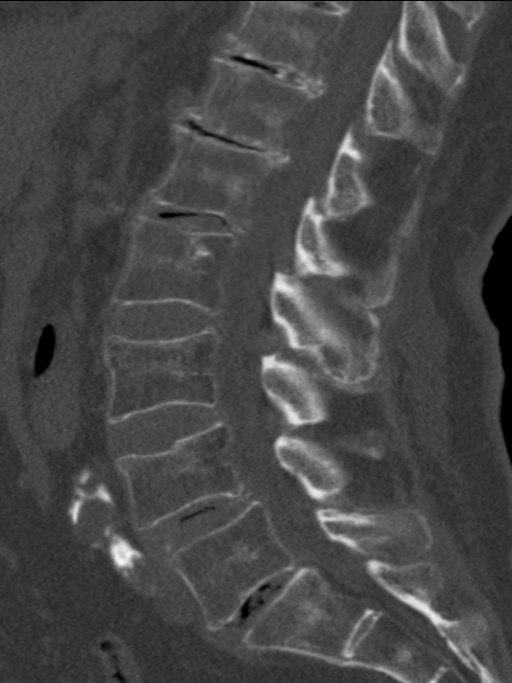
[im 36/61  bone]
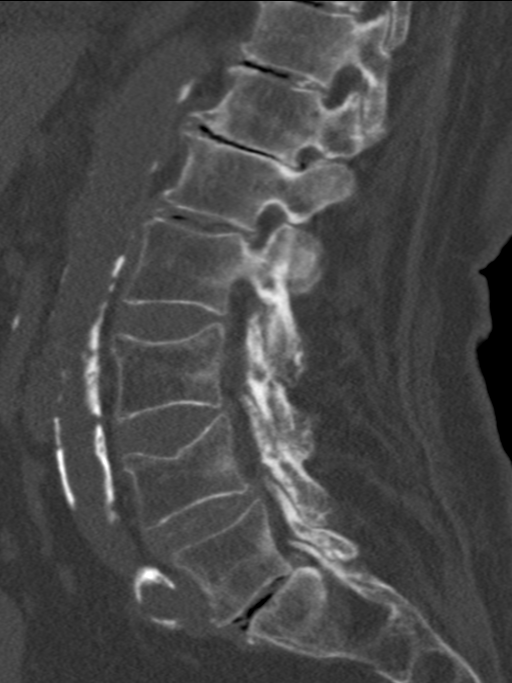
[im 41/61  bone]
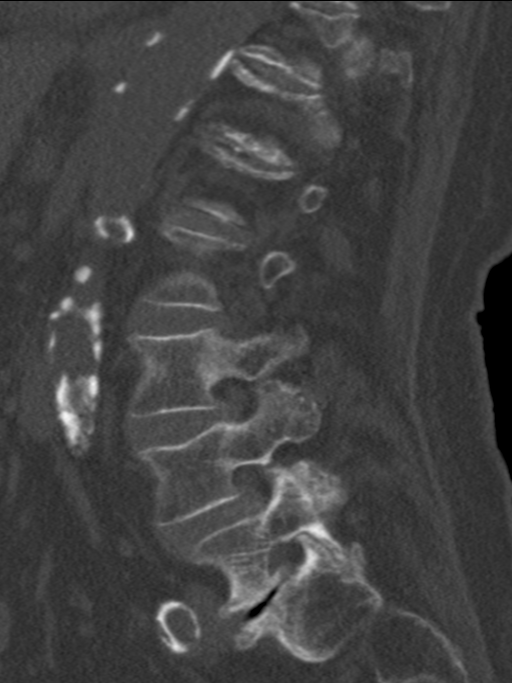

[12 of 33 positions shown; findings below may reference images not displayed]

FINDINGS: Segmentation: 5 lumbar type vertebrae.

Alignment: Degenerative anterolisthesis of L4 on L5 and L5 on S1
measuring 3 mm.

Vertebrae: L2 superior endplate Schmorl's node. L3 vertebral body
compression fracture with 15% height loss centrally, not definitely
new from the prior chest radiographs. L4 superior endplate
compression fracture with 25-30% height loss, also not definitely
new from the prior radiographs though more prominent in appearance.
No retropulsion at either level. No posterior element fracture. No
destructive osseous process.

Paraspinal and other soft tissues: No significant paravertebral
hematoma at either the L3 or L4 levels. Partially visualized small
right pleural effusion and bibasilar atelectasis. Possible small
sliding hiatal hernia. Extensive abdominal aortic atherosclerosis
without aneurysm.

Disc levels: Advanced disc degeneration at T11-12 and T12-L1 with
severe disc space narrowing and vacuum disc. Moderate left neural
foraminal stenosis at T12-L1.

L1-2: Mild disc space narrowing and vacuum disc. Disc bulging and
mild facet arthrosis result in at most mild bilateral neural
foraminal stenosis without spinal stenosis.

L2-3: Mild disc bulging and moderate facet arthrosis result in
borderline spinal stenosis without neural foraminal stenosis.

L3-4: Disc bulging and severe facet and ligamentum flavum
hypertrophy result in moderate to severe spinal stenosis and
moderate bilateral neural foraminal stenosis.

L4-5: Vacuum disc. Anterolisthesis with bulging uncovered disc and
severe facet hypertrophy result in moderate to severe spinal
stenosis and moderate bilateral neural foraminal stenosis.

L5-S1: Mild disc space narrowing and vacuum disc. Anterolisthesis
with bulging uncovered disc, ligamentum flavum thickening, and
severe facet arthrosis result in moderate to severe right and mild
left neural foraminal stenosis and mild spinal stenosis.
IMPRESSION: 1. Mild L3 and L4 compression fractures, potentially chronic. If the
patient has pain directly referable to this region and vertebral
augmentation would be considered for treatment of an acute fracture,
consider nuclear medicine bone scan for further evaluation.
2. Lumbar disc and facet degeneration with moderate to severe spinal
stenosis and moderate bilateral neural foraminal stenosis at L3-4
and L4-5.
3.  Aortic Atherosclerosis ([id number]).
